# Patient Record
Sex: Female | Born: 1960 | Race: Black or African American | Hispanic: No | Smoking: Never smoker
Health system: Southern US, Community
[De-identification: ages and names within clinical notes are randomized; demographics above are authoritative.]

## PROBLEM LIST (undated history)

## (undated) DIAGNOSIS — G4730 Sleep apnea, unspecified: Secondary | ICD-10-CM

## (undated) DIAGNOSIS — C541 Malignant neoplasm of endometrium: Secondary | ICD-10-CM

## (undated) DIAGNOSIS — U071 COVID-19: Secondary | ICD-10-CM

## (undated) DIAGNOSIS — I10 Essential (primary) hypertension: Secondary | ICD-10-CM

## (undated) HISTORY — DX: Malignant neoplasm of endometrium: C54.1

## (undated) HISTORY — DX: COVID-19: U07.1

## (undated) HISTORY — PX: DILATION AND CURETTAGE, DIAGNOSTIC / THERAPEUTIC: SUR384

## (undated) HISTORY — PX: FOOT SURGERY: SHX648

## (undated) HISTORY — DX: Essential (primary) hypertension: I10

## (undated) HISTORY — PX: EXPLORATORY LAPAROTOMY: SUR591

## (undated) HISTORY — DX: Morbid (severe) obesity due to excess calories: E66.01

---

## 2007-01-07 ENCOUNTER — Emergency Department: Payer: Self-pay | Admitting: Emergency Medicine

## 2008-07-26 ENCOUNTER — Emergency Department: Payer: Self-pay | Admitting: Emergency Medicine

## 2008-08-08 ENCOUNTER — Emergency Department: Payer: Self-pay | Admitting: Emergency Medicine

## 2009-05-27 ENCOUNTER — Emergency Department: Payer: Self-pay | Admitting: Emergency Medicine

## 2013-12-04 ENCOUNTER — Emergency Department: Payer: Self-pay | Admitting: Emergency Medicine

## 2013-12-04 LAB — BASIC METABOLIC PANEL
Anion Gap: 5 — ABNORMAL LOW (ref 7–16)
BUN: 19 mg/dL — ABNORMAL HIGH (ref 7–18)
Calcium, Total: 8.6 mg/dL (ref 8.5–10.1)
Chloride: 107 mmol/L (ref 98–107)
Co2: 27 mmol/L (ref 21–32)
Creatinine: 0.95 mg/dL (ref 0.60–1.30)
EGFR (African American): >60
GLUCOSE: 92 mg/dL (ref 65–99)
Osmolality: 279 (ref 275–301)
Potassium: 3.6 mmol/L (ref 3.5–5.1)
Sodium: 139 mmol/L (ref 136–145)

## 2013-12-04 LAB — CBC
HCT: 37.3 % (ref 35.0–47.0)
HGB: 12.1 g/dL (ref 12.0–16.0)
MCH: 27.6 pg (ref 26.0–34.0)
MCHC: 32.4 g/dL (ref 32.0–36.0)
MCV: 85 fL (ref 80–100)
Platelet: 222 10*3/uL (ref 150–440)
RBC: 4.37 10*6/uL (ref 3.80–5.20)
RDW: 14 % (ref 11.5–14.5)
WBC: 8.3 10*3/uL (ref 3.6–11.0)

## 2013-12-04 LAB — TROPONIN I

## 2013-12-05 LAB — TROPONIN I: Troponin-I: <0.02 ng/mL

## 2013-12-05 LAB — D-DIMER(ARMC): D-Dimer: 505 ng/ml

## 2014-06-18 ENCOUNTER — Ambulatory Visit: Payer: Self-pay

## 2019-07-28 ENCOUNTER — Emergency Department
Admission: EM | Admit: 2019-07-28 | Discharge: 2019-07-28 | Disposition: A | Discharge status: Home / Self Care | Payer: Medicaid Other | Attending: Emergency Medicine | Admitting: Emergency Medicine

## 2019-07-28 ENCOUNTER — Other Ambulatory Visit: Payer: Self-pay

## 2019-07-28 ENCOUNTER — Emergency Department: Payer: Medicaid Other

## 2019-07-28 ENCOUNTER — Encounter: Payer: Self-pay | Admitting: Emergency Medicine

## 2019-07-28 DIAGNOSIS — R0789 Other chest pain: Secondary | ICD-10-CM

## 2019-07-28 DIAGNOSIS — R109 Unspecified abdominal pain: Secondary | ICD-10-CM | POA: Insufficient documentation

## 2019-07-28 DIAGNOSIS — M7918 Myalgia, other site: Secondary | ICD-10-CM

## 2019-07-28 LAB — CBC
HCT: 41.4 % (ref 36.0–46.0)
Hemoglobin: 14 g/dL (ref 12.0–15.0)
MCH: 28.5 pg (ref 26.0–34.0)
MCHC: 33.8 g/dL (ref 30.0–36.0)
MCV: 84.1 fL (ref 80.0–100.0)
Platelets: 260 10*3/uL (ref 150–400)
RBC: 4.92 MIL/uL (ref 3.87–5.11)
RDW: 13.5 % (ref 11.5–15.5)
WBC: 6.7 10*3/uL (ref 4.0–10.5)
nRBC: 0 % (ref 0.0–0.2)

## 2019-07-28 LAB — BASIC METABOLIC PANEL
Anion gap: 10 (ref 5–15)
BUN: 16 mg/dL (ref 6–20)
CO2: 25 mmol/L (ref 22–32)
Calcium: 8.8 mg/dL — ABNORMAL LOW (ref 8.9–10.3)
Chloride: 105 mmol/L (ref 98–111)
Creatinine, Ser: 0.79 mg/dL (ref 0.44–1.00)
GFR calc Af Amer: >60 mL/min (ref >60)
GFR calc non Af Amer: >60 mL/min (ref >60)
Glucose, Bld: 103 mg/dL — ABNORMAL HIGH (ref 70–99)
Potassium: 4 mmol/L (ref 3.5–5.1)
Sodium: 140 mmol/L (ref 135–145)

## 2019-07-28 LAB — TROPONIN I (HIGH SENSITIVITY): Troponin I (High Sensitivity): 4 ng/L (ref <18)

## 2019-07-28 MED ORDER — KETOROLAC TROMETHAMINE 30 MG/ML IJ SOLN
30.0000 mg | Freq: Once | INTRAMUSCULAR | Status: AC
  Administered 2019-07-28: 30 mg via INTRAMUSCULAR
  Filled 2019-07-28: qty 1

## 2019-07-28 MED ORDER — NAPROXEN 500 MG PO TABS: 500.0000 mg | ORAL_TABLET | Freq: Two times a day (BID) | ORAL | 2 refills | Status: DC

## 2019-07-28 NOTE — ED Notes (Signed)
Placed BP cuff on pt's rt forearm.

## 2019-07-28 NOTE — ED Notes (Signed)
Patient transported to CT 

## 2019-07-28 NOTE — ED Triage Notes (Signed)
Pt reports chest pain on right side that started 3 days ago. Reports intermittent shortness of breath. Denies N/V, dizziness, or sweating with chest pain. Denies hx of heart problems. Reports that chest pain has occurred before.

## 2019-07-28 NOTE — ED Provider Notes (Signed)
[facility] Emergency Department Provider Note   ____________________________________________    I have reviewed the triage vital signs and the nursing notes.   HISTORY  Chief Complaint Right side pain    HPI Allison Dixon is a 58 y.o. female who presents with primary complaint of right side pain which she reports is been ongoing for 3 days, she is also noted some mild left chest discomfort as well which she describes as a sharp pain.  Denies injury to the area.  No recent exertion.  No shortness of breath.  No cough.  No nausea or vomiting.  No history of kidney stones or dysuria.  Has not take anything for this.  No recent travel, no sick contacts noted.  History reviewed. No pertinent past medical history.  There are no active problems to display for this patient.   History reviewed. No pertinent surgical history.  Prior to Admission medications   Medication Sig Start Date End Date Taking? Authorizing Provider  ibuprofen (ADVIL) 200 MG tablet Take 400 mg by mouth every 6 (six) hours as needed for pain.    [provider]  naproxen (NAPROSYN) 500 MG tablet Take 1 tablet (500 mg total) by mouth 2 (two) times daily with a meal. 07/28/19   Lavonia Drafts, MD     Allergies Patient has no known allergies.  History reviewed. No pertinent family history.  Social History Social History   Tobacco Use  . Smoking status: Never Smoker  Substance Use Topics  . Alcohol use: Never    Frequency: Never  . Drug use: Never    Review of Systems  Constitutional: No fever/chills Eyes: No visual changes.  ENT: No sore throat. Cardiovascular: As above Respiratory: Denies shortness of breath. Gastrointestinal: As above Genitourinary: Negative for dysuria. Musculoskeletal: Negative for back pain. Skin: Negative for rash. Neurological: Negative for headaches   ____________________________________________   PHYSICAL EXAM:  VITAL SIGNS: ED  Triage Vitals  Enc Vitals Group     BP 07/28/19 0826 (!) 147/98     Pulse Rate 07/28/19 0826 80     Resp 07/28/19 0826 20     Temp 07/28/19 0826 98.3 F (36.8 C)     Temp Source 07/28/19 0826 Oral     SpO2 07/28/19 0826 96 %     Weight 07/28/19 0815 108.9 kg (240 lb)     Height 07/28/19 0815 1.549 m (5\' 1" )     Head Circumference --      Peak Flow --      Pain Score 07/28/19 0814 9     Pain Loc --      Pain Edu? --      Excl. in [place]? --     Constitutional: Alert and oriented.   Nose: No congestion/rhinnorhea. Mouth/Throat: Mucous membranes are moist.    Cardiovascular: Normal rate, regular rhythm. Grossly normal heart sounds.  Good peripheral circulation. Respiratory: Normal respiratory effort.  No retractions. Lungs CTAB. Gastrointestinal: Soft and nontender. No distention.    Musculoskeletal: Mild tenderness to the right abdominal wall in the area of the obliques, warm and well perfused Neurologic:  Normal speech and language. No gross focal neurologic deficits are appreciated.  Skin:  Skin is warm, dry and intact. No rash noted. Psychiatric: Mood and affect are normal. Speech and behavior are normal.  ____________________________________________   LABS (all labs ordered are listed, but only abnormal results are displayed)  Labs Reviewed  BASIC METABOLIC PANEL - Abnormal; Notable for  the following components:      Result Value   Glucose, Bld 103 (*)    Calcium 8.8 (*)    All other components within normal limits  CBC  POC URINE PREG, ED  TROPONIN I (HIGH SENSITIVITY)   ____________________________________________  EKG  ED ECG REPORT I, Lavonia Drafts, the attending physician, personally viewed and interpreted this ECG.  Date: 07/28/2019  Rhythm: normal sinus rhythm QRS Axis: normal Intervals: normal ST/T Wave abnormalities: normal Narrative Interpretation: no evidence of acute ischemia  ____________________________________________  RADIOLOGY  CT  renal stone study unremarkable, chest x-ray unremarkable ____________________________________________   PROCEDURES  Procedure(s) performed: No  Procedures   Critical Care performed: No ____________________________________________   INITIAL IMPRESSION / ASSESSMENT AND PLAN / ED COURSE  Pertinent labs & imaging results that were available during my care of the patient were reviewed by me and considered in my medical decision making (see chart for details).  Patient's exam is suspicious for musculoskeletal pain given pain along the obliques, differential also includes  ureterolithiasis.  Lab work is overall reassuring, will send for CT renal stone study, treat with IM Toradol and reevaluate.  Patient denies dysuria, no frequency  Patient notes her pain is resolved after Toradol, CT scan is unremarkable.  Most consistent with musculoskeletal pain, will treat with Naprosyn, close outpatient follow-up with PCP, return precautions discussed ____________________________________________   FINAL CLINICAL IMPRESSION(S) / ED DIAGNOSES  Final diagnoses:  Atypical chest pain  Musculoskeletal pain        Note:  This document was prepared using Dragon voice recognition software and may include unintentional dictation errors.   Lavonia Drafts, MD 07/28/19 [phone]

## 2019-07-28 NOTE — ED Notes (Signed)
Daughter is at bedside w/ pt

## 2019-07-28 NOTE — ED Notes (Signed)
In [place] take pt to 8 when done.

## 2019-08-11 ENCOUNTER — Ambulatory Visit: Payer: Self-pay | Admitting: Cardiology

## 2019-08-17 ENCOUNTER — Ambulatory Visit: Payer: Self-pay | Admitting: Cardiology

## 2019-08-21 ENCOUNTER — Ambulatory Visit: Payer: Self-pay | Admitting: Cardiology

## 2019-09-05 ENCOUNTER — Encounter: Payer: Self-pay | Admitting: Cardiology

## 2019-09-05 ENCOUNTER — Other Ambulatory Visit: Payer: Self-pay

## 2019-09-05 ENCOUNTER — Ambulatory Visit (INDEPENDENT_AMBULATORY_CARE_PROVIDER_SITE_OTHER): Payer: Medicaid Other | Admitting: Cardiology

## 2019-09-05 VITALS — BP 150/98 | HR 78 | Ht 66.0 in | Wt 244.0 lb

## 2019-09-05 DIAGNOSIS — Z6839 Body mass index (BMI) 39.0-39.9, adult: Secondary | ICD-10-CM | POA: Diagnosis not present

## 2019-09-05 DIAGNOSIS — R0789 Other chest pain: Secondary | ICD-10-CM

## 2019-09-05 DIAGNOSIS — I10 Essential (primary) hypertension: Secondary | ICD-10-CM

## 2019-09-05 MED ORDER — LOSARTAN POTASSIUM 50 MG PO TABS: 50.0000 mg | ORAL_TABLET | Freq: Every day | ORAL | 3 refills | Status: DC

## 2019-09-05 MED ORDER — OMEPRAZOLE 40 MG PO CPDR: 40.0000 mg | DELAYED_RELEASE_CAPSULE | Freq: Every day | ORAL | 2 refills | Status: DC

## 2019-09-05 NOTE — Patient Instructions (Signed)
Medication Instructions:  Your physician has recommended you make the following change in your medication:  1- START Losartan 50 mg (1 tablet) by mouth once a day. 2- START Omeprazole 40 mg by mouth once a day.  (You may be able to get this medication over-the-counter as a less expensive cost. Discuss with the  Pharmacist.)  *If you need a refill on your cardiac medications before your next appointment, please call your pharmacy*  Lab Work: none If you have labs (blood work) drawn today and your tests are completely normal, you will receive your results only by: Marland Kitchen MyChart Message (if you have MyChart) OR . A paper copy in the mail If you have any lab test that is abnormal or we need to change your treatment, we will call you to review the results.  Testing/Procedures: none  Follow-Up: At [facility], you and your health needs are our priority.  As part of our continuing mission to provide you with exceptional heart care, we have created designated Provider Care Teams.  These Care Teams include your primary Cardiologist (physician) and Advanced Practice Providers (APPs -  Physician Assistants and Nurse Practitioners) who all work together to provide you with the care you need, when you need it.  Your next appointment:   1 month(s)  The format for your next appointment:   In Person  Provider:    You may see Dr. Garen Lah or one of the following Advanced Practice Providers on your designated Care Team:    Murray Hodgkins, NP  Christell Faith, PA-C  Marrianne Mood, PA-C

## 2019-09-05 NOTE — Progress Notes (Signed)
Cardiology Office Note:    Date:  09/05/2019   ID:  Allison Dixon, DOB 12/23/1960, MRN [id number]  PCP:  Patient, No Pcp Per  Cardiologist:  No primary care provider on file.  Electrophysiologist:  None   Referring MD: Lavonia Drafts, MD   Chief Complaint  Patient presents with  . other    F/u [facility] CP Pt no compliants. Meds reviewed verbally with pt.     History of Present Illness:    Allison Dixon is a 58 y.o. female with a hx of hypertension who presents due to chest pain.  Symptoms have been ongoing for about a year now.  Symptoms typically occur when patient is lying down in bed.  She describes symptoms as a burning sensation in her chest when she lays on her back, rates discomfort as a 7 out of 10 when it occurs.  She usually sleeps on the side to make symptoms better.  She has no symptoms with exertion but states feeling out of breath when walking from the parking lot to the office today.  Patient was seen about a month ago in the emergency room for chest pain and also back pain. In the ED, high-sensitivity troponin was normal, EKG did not show any ST changes to suggest ischemia or infarct.  Due to lumbar pain, CT was performed to evaluate possible kidney stones.  No ureterolithiasis was observed, but degenerative disease and lumbar spine spondylosis noted.  Pain improved with IM Toradol.    She denies any prior history of heart disease, she has not seen a physician in a while, has no PCP due to lack of insurance.  She is trying to get insurance.  She has been told before that her blood pressures were elevated.  Her BP in the emergency room was [id number] systolic.  Past Medical History:  Diagnosis Date  . Hypertension     History reviewed. No pertinent surgical history.  Current Medications: Current Meds  Medication Sig  . ibuprofen (ADVIL) 200 MG tablet Take 400 mg by mouth every 6 (six) hours as needed for pain.  . naproxen (NAPROSYN) 500 MG tablet Take 1 tablet (500 mg total) by  mouth 2 (two) times daily with a meal.     Allergies:   Patient has no known allergies.   Social History   Socioeconomic History  . Marital status: Married    Spouse name: Not on file  . Number of children: Not on file  . Years of education: Not on file  . Highest education level: Not on file  Occupational History  . Not on file  Tobacco Use  . Smoking status: Never Smoker  . Smokeless tobacco: Never Used  Substance and Sexual Activity  . Alcohol use: Never  . Drug use: Never  . Sexual activity: Yes  Other Topics Concern  . Not on file  Social History Narrative  . Not on file   Social Determinants of Health   Financial Resource Strain:   . Difficulty of Paying Living Expenses: Not on file  Food Insecurity:   . Worried About Charity fundraiser in the Last Year: Not on file  . Ran Out of Food in the Last Year: Not on file  Transportation Needs:   . Lack of Transportation (Medical): Not on file  . Lack of Transportation (Non-Medical): Not on file  Physical Activity:   . Days of Exercise per Week: Not on file  . Minutes of Exercise per Session: Not on  file  Stress:   . Feeling of Stress : Not on file  Social Connections:   . Frequency of Communication with Friends and Family: Not on file  . Frequency of Social Gatherings with Friends and Family: Not on file  . Attends Religious Services: Not on file  . Active Member of Clubs or Organizations: Not on file  . Attends Archivist Meetings: Not on file  . Marital Status: Not on file     Family History: The patient's family history includes Heart attack in her mother.  ROS:   Please see the history of present illness.     All other systems reviewed and are negative.  EKGs/Labs/Other Studies Reviewed:    The following studies were reviewed today:   EKG:  EKG is  ordered today.  The ekg ordered today demonstrates normal sinus rhythm, occasional PVC.  Recent Labs: 07/28/2019: BUN 16; Creatinine, Ser  0.79; Hemoglobin 14.0; Platelets 260; Potassium 4.0; Sodium 140  Recent Lipid Panel No results found for: CHOL, TRIG, HDL, CHOLHDL, VLDL, LDLCALC, LDLDIRECT  Physical Exam:    VS:  BP (!) 150/98 (BP Location: Right Arm, Patient Position: Sitting, Cuff Size: Large)   Pulse 78   Ht 5\' 6"  (1.676 m)   Wt 244 lb (110.7 kg)   SpO2 97%   BMI 39.38 kg/m     Wt Readings from Last 3 Encounters:  09/05/19 244 lb (110.7 kg)  07/28/19 240 lb (108.9 kg)     GEN:  Well nourished, well developed in no acute distress HEENT: Normal NECK: No JVD; No carotid bruits LYMPHATICS: No lymphadenopathy CARDIAC: RRR, no murmurs, rubs, gallops RESPIRATORY:  Clear to auscultation without rales, wheezing or rhonchi  ABDOMEN: Soft, non-tender, non-distended MUSCULOSKELETAL:  No edema; No deformity  SKIN: Warm and dry NEUROLOGIC:  Alert and oriented x 3 PSYCHIATRIC:  Normal affect   ASSESSMENT:   Her symptoms appear atypical.  Symptoms likely GI related due to long chest burning when she lays on her back and improves with laying on her side.  Blood pressure is elevated.  Was elevated in the emergency room.  Shortness of breath with exertion likely deconditioning due to being obese. 1. Atypical chest pain   2. Hypertension, unspecified type   3. BMI 39.0-39.9,adult    PLAN:    In order of problems listed above:  1. Start omeprazole 40 mg daily.  May consider further testing if symptoms persist. 2. Start losartan 50 mg daily.  Patient encouraged to check blood pressures daily at home. 3. Weight loss advised.  Follow-up in 1 month  This note was generated in part or whole with voice recognition software. Voice recognition is usually quite accurate but there are transcription errors that can and very often do occur. I apologize for any typographical errors that were not detected and corrected.  Medication Adjustments/Labs and Tests Ordered: Current medicines are reviewed at length with the patient  today.  Concerns regarding medicines are outlined above.  Orders Placed This Encounter  Procedures  . EKG 12-Lead   Meds ordered this encounter  Medications  . losartan (COZAAR) 50 MG tablet    Sig: Take 1 tablet (50 mg total) by mouth daily.    Dispense:  90 tablet    Refill:  3  . omeprazole (PRILOSEC) 40 MG capsule    Sig: Take 1 capsule (40 mg total) by mouth daily.    Dispense:  90 capsule    Refill:  2    Patient  Instructions  Medication Instructions:  Your physician has recommended you make the following change in your medication:  1- START Losartan 50 mg (1 tablet) by mouth once a day. 2- START Omeprazole 40 mg by mouth once a day.  (You may be able to get this medication over-the-counter as a less expensive cost. Discuss with the  Pharmacist.)  *If you need a refill on your cardiac medications before your next appointment, please call your pharmacy*  Lab Work: none If you have labs (blood work) drawn today and your tests are completely normal, you will receive your results only by: Marland Kitchen MyChart Message (if you have MyChart) OR . A paper copy in the mail If you have any lab test that is abnormal or we need to change your treatment, we will call you to review the results.  Testing/Procedures: none  Follow-Up: At [facility], you and your health needs are our priority.  As part of our continuing mission to provide you with exceptional heart care, we have created designated Provider Care Teams.  These Care Teams include your primary Cardiologist (physician) and Advanced Practice Providers (APPs -  Physician Assistants and Nurse Practitioners) who all work together to provide you with the care you need, when you need it.  Your next appointment:   1 month(s)  The format for your next appointment:   In Person  Provider:    You may see Dr. Garen Lah or one of the following Advanced Practice Providers on your designated Care Team:    Murray Hodgkins, NP  Christell Faith, PA-C  Marrianne Mood, PA-C     Signed, Kate Sable, MD  09/05/2019 9:29 AM    [place]

## 2019-09-08 DIAGNOSIS — C801 Malignant (primary) neoplasm, unspecified: Secondary | ICD-10-CM

## 2019-09-08 HISTORY — DX: Malignant (primary) neoplasm, unspecified: C80.1

## 2019-09-12 ENCOUNTER — Telehealth: Payer: Self-pay | Admitting: Cardiology

## 2019-09-12 NOTE — Telephone Encounter (Signed)
Call received from patient and daughter. Patient gave verbal permission to speak with daughter, Mateo Flow. Mateo Flow states patient BP today was 170/92. Reports patient says it is "too strong." Inquired on what "too strong" meant and it sounded like patient thought the 50 mg was too high. Explained that the Mg on medications may be different and does not compare to each other. Patient says she's had a headache since last night. Patient reports taking the losartan 50 mg every night before bed for the past week. They could not provide any other readings at this time but daughter thinks BP has remained elevated. Denies chest pain or shortness of breath or blurred vision. Reports occasional dizziness.  Advised patient to continue losartan and monitor BP and HR every morning. Advised ok to take tylenol and to call us back if headache does not go away or BP remains elevated. Routing to Dr Garen Lah for further review and advice.

## 2019-09-12 NOTE — Telephone Encounter (Signed)
Pt c/o BP issue: STAT if pt c/o blurred vision, one-sided weakness or slurred speech  1. What are your last 5 BP readings? 170/92  2. Are you having any other symptoms (ex. Dizziness, headache, blurred vision, passed out)? headache  3. What is your BP issue? Patient daughter is calling.  States patient's BP is high and is getting a headache, wants to take tylenol but daughter wants to check with doctor before taking.  Please advise.

## 2019-09-18 MED ORDER — AMLODIPINE BESYLATE 5 MG PO TABS: 5.0000 mg | ORAL_TABLET | Freq: Every day | ORAL | 5 refills | Status: DC

## 2019-09-18 NOTE — Telephone Encounter (Signed)
No answer with daughter's number. Left message to call back.  Called patient and she answered. She verbalized understanding to stop the losartan and start amlodipine 5 mg by mouth once a day. Med list updated and Rx sent to pharmacy.

## 2019-09-18 NOTE — Telephone Encounter (Signed)
Kate Sable, MD  You 54 minutes ago (8:32 AM)   We can stop the losartan. Start amlodipine 5 mg daily. Keep follow-up appointment.   Routing comment

## 2019-09-22 ENCOUNTER — Emergency Department
Admission: EM | Admit: 2019-09-22 | Discharge: 2019-09-22 | Disposition: A | Discharge status: Home / Self Care | Payer: Medicaid Other | Attending: Emergency Medicine | Admitting: Emergency Medicine

## 2019-09-22 ENCOUNTER — Encounter: Payer: Self-pay | Admitting: Emergency Medicine

## 2019-09-22 ENCOUNTER — Other Ambulatory Visit: Payer: Self-pay

## 2019-09-22 DIAGNOSIS — Z79899 Other long term (current) drug therapy: Secondary | ICD-10-CM | POA: Insufficient documentation

## 2019-09-22 DIAGNOSIS — Z20822 Contact with and (suspected) exposure to covid-19: Secondary | ICD-10-CM | POA: Diagnosis not present

## 2019-09-22 DIAGNOSIS — I10 Essential (primary) hypertension: Secondary | ICD-10-CM | POA: Insufficient documentation

## 2019-09-22 DIAGNOSIS — R531 Weakness: Secondary | ICD-10-CM | POA: Insufficient documentation

## 2019-09-22 LAB — CBC
HCT: 42.7 % (ref 36.0–46.0)
Hemoglobin: 13.7 g/dL (ref 12.0–15.0)
MCH: 28.1 pg (ref 26.0–34.0)
MCHC: 32.1 g/dL (ref 30.0–36.0)
MCV: 87.7 fL (ref 80.0–100.0)
Platelets: 178 10*3/uL (ref 150–400)
RBC: 4.87 MIL/uL (ref 3.87–5.11)
RDW: 13.2 % (ref 11.5–15.5)
WBC: 5.8 10*3/uL (ref 4.0–10.5)
nRBC: 0 % (ref 0.0–0.2)

## 2019-09-22 LAB — URINALYSIS, COMPLETE (UACMP) WITH MICROSCOPIC
Bacteria, UA: NONE SEEN
Bilirubin Urine: NEGATIVE
Glucose, UA: NEGATIVE mg/dL
Ketones, ur: 5 mg/dL — AB
Leukocytes,Ua: NEGATIVE
Nitrite: NEGATIVE
Protein, ur: 30 mg/dL — AB
Specific Gravity, Urine: 1.028 (ref 1.005–1.030)
pH: 5 (ref 5.0–8.0)

## 2019-09-22 LAB — BASIC METABOLIC PANEL
Anion gap: 10 (ref 5–15)
BUN: 14 mg/dL (ref 6–20)
CO2: 25 mmol/L (ref 22–32)
Calcium: 8.4 mg/dL — ABNORMAL LOW (ref 8.9–10.3)
Chloride: 104 mmol/L (ref 98–111)
Creatinine, Ser: 0.98 mg/dL (ref 0.44–1.00)
GFR calc Af Amer: >60 mL/min (ref >60)
GFR calc non Af Amer: >60 mL/min (ref >60)
Glucose, Bld: 117 mg/dL — ABNORMAL HIGH (ref 70–99)
Potassium: 3.7 mmol/L (ref 3.5–5.1)
Sodium: 139 mmol/L (ref 135–145)

## 2019-09-22 MED ORDER — ONDANSETRON HCL 4 MG PO TABS: 4.0000 mg | ORAL_TABLET | Freq: Three times a day (TID) | ORAL | 0 refills | Status: DC | PRN

## 2019-09-22 MED ORDER — SODIUM CHLORIDE 0.9% FLUSH: 3.0000 mL | Freq: Once | INTRAVENOUS | Status: DC

## 2019-09-22 MED ORDER — SODIUM CHLORIDE 0.9 % IV BOLUS
1000.0000 mL | Freq: Once | INTRAVENOUS | Status: AC
  Administered 2019-09-22: 1000 mL via INTRAVENOUS

## 2019-09-22 NOTE — Discharge Instructions (Addendum)
Please seek medical attention for any high fevers, chest pain, shortness of breath, change in behavior, persistent vomiting, bloody stool or any other new or concerning symptoms.  

## 2019-09-22 NOTE — ED Triage Notes (Signed)
Dizzy/weakness/ sore throat/ difficulty swallowing. Poor appetite  x5 days

## 2019-09-22 NOTE — ED Notes (Signed)
See triage note. Pt states weakness since Sunday with diarrhea and not being able to eat for a few days.

## 2019-09-22 NOTE — ED Provider Notes (Signed)
[facility] Emergency Department Provider Note   ____________________________________________   I have reviewed the triage vital signs and the nursing notes.   HISTORY  Chief Complaint Weakness   History limited by: Not Limited   HPI Allison Dixon is a 59 y.o. female who presents to the emergency department today because of concern for weakness and feeling off for the past 5 days. The patient states that 5 days ago she was exposed to someone at church who tested positive for COVID. Since then she has felt weak and dizzy. Has had decreased PO intake. States her sense of taste and smell are both decreased. She has had occasional cough. Denies any fevers. Did get tested for COVID 3 days ago and was told that her result was negative. The patient denies any chronic lung disease.   Records reviewed. Per medical record review patient has a history of HTN.   Past Medical History:  Diagnosis Date  . Hypertension     There are no problems to display for this patient.   History reviewed. No pertinent surgical history.  Prior to Admission medications   Medication Sig Start Date End Date Taking? Authorizing Provider  amLODipine (NORVASC) 5 MG tablet Take 1 tablet (5 mg total) by mouth daily. 09/18/19 12/17/19  Kate Sable, MD  ibuprofen (ADVIL) 200 MG tablet Take 400 mg by mouth every 6 (six) hours as needed for pain.    [provider]  naproxen (NAPROSYN) 500 MG tablet Take 1 tablet (500 mg total) by mouth 2 (two) times daily with a meal. 07/28/19   Lavonia Drafts, MD  omeprazole (PRILOSEC) 40 MG capsule Take 1 capsule (40 mg total) by mouth daily. 09/05/19   Kate Sable, MD    Allergies Patient has no known allergies.  Family History  Problem Relation Age of Onset  . Heart attack Mother     Social History Social History   Tobacco Use  . Smoking status: Never Smoker  . Smokeless tobacco: Never Used  Substance Use Topics  . Alcohol  use: Never  . Drug use: Never    Review of Systems Constitutional: No fever/chills Eyes: No visual changes. ENT: Positive for change in sense of taste and smell.  Cardiovascular: Denies chest pain. Respiratory: Denies shortness of breath. Gastrointestinal: No abdominal pain.  Positive for decreased oral intake.  Genitourinary: Negative for dysuria. Musculoskeletal: Negative for back pain. Skin: Negative for rash. Neurological: Negative for headaches, focal weakness or numbness.  ____________________________________________   PHYSICAL EXAM:  VITAL SIGNS: ED Triage Vitals  Enc Vitals Group     BP 09/22/19 1125 112/76     Pulse Rate 09/22/19 1125      Resp 09/22/19 1125 18     Temp 09/22/19 1125 99.4 F (37.4 C)     Temp Source 09/22/19 1125 Oral     SpO2 --      Weight 09/22/19 1126 244 lb (110.7 kg)     Height 09/22/19 1126 5\' 4"  (1.626 m)     Head Circumference --      Peak Flow --      Pain Score 09/22/19 1126 8   Constitutional: Alert and oriented.  Eyes: Conjunctivae are normal.  ENT      Head: Normocephalic and atraumatic.      Nose: No congestion/rhinnorhea.      Mouth/Throat: Mucous membranes are moist.      Neck: No stridor. Hematological/Lymphatic/Immunilogical: No cervical lymphadenopathy. Cardiovascular: Normal rate, regular rhythm.  No murmurs,  rubs, or gallops.  Respiratory: Normal respiratory effort without tachypnea nor retractions. Breath sounds are clear and equal bilaterally. No wheezes/rales/rhonchi. Gastrointestinal: Soft and non tender. No rebound. No guarding.  Genitourinary: Deferred Musculoskeletal: Normal range of motion in all extremities. No lower extremity edema. Neurologic:  Normal speech and language. No gross focal neurologic deficits are appreciated.  Skin:  Skin is warm, dry and intact. No rash noted. Psychiatric: Mood and affect are normal. Speech and behavior are normal. Patient exhibits appropriate insight and  judgment.  ____________________________________________    LABS (pertinent positives/negatives)  CBC wbc 5.8, hgb 13.7, plt 178 BMP wnl except glu 117, ca 8.4 UA hazy, small hgb dipstick, 5 ketones, 30 protein, 0-5 rbc and wbc ____________________________________________   EKG  I, Nance Pear, attending physician, personally viewed and interpreted this EKG  EKG Time: 1135 Rate: 99 Rhythm: normal sinus rhythm Axis: rightward axis Intervals: qtc 426 QRS: narrow ST changes: no st elevation Impression: abnormal ekg ____________________________________________    RADIOLOGY  None  ____________________________________________   PROCEDURES  Procedures  ____________________________________________   INITIAL IMPRESSION / ASSESSMENT AND PLAN / ED COURSE  Pertinent labs & imaging results that were available during my care of the patient were reviewed by me and considered in my medical decision making (see chart for details).   Patient presented to the emergency department today because of concern for weakness and not feeling well. Did have COVID exposure. At this time I would have concern for covid given change in sensation of taste and smell. Patient did feel better after IVFs. Discussed covid with the patient. Will send off test. Will plan on discharging with zofran. Encouraged increased oral intake. Discussed return precautions.   ____________________________________________   FINAL CLINICAL IMPRESSION(S) / ED DIAGNOSES  Final diagnoses:  Weakness  Suspected COVID-19 virus infection     Note: This dictation was prepared with Dragon dictation. Any transcriptional errors that result from this process are unintentional     Nance Pear, MD 09/22/19 1521

## 2019-09-23 LAB — NOVEL CORONAVIRUS, NAA (HOSP ORDER, SEND-OUT TO REF LAB; TAT 18-24 HRS): SARS-CoV-2, NAA: NOT DETECTED

## 2019-10-06 ENCOUNTER — Other Ambulatory Visit: Payer: Self-pay

## 2019-10-06 ENCOUNTER — Ambulatory Visit (INDEPENDENT_AMBULATORY_CARE_PROVIDER_SITE_OTHER): Payer: Medicaid Other | Admitting: Cardiology

## 2019-10-06 ENCOUNTER — Encounter: Payer: Self-pay | Admitting: Cardiology

## 2019-10-06 VITALS — BP 118/82 | HR 94 | Ht 64.0 in | Wt 235.5 lb

## 2019-10-06 DIAGNOSIS — I10 Essential (primary) hypertension: Secondary | ICD-10-CM | POA: Diagnosis not present

## 2019-10-06 DIAGNOSIS — R0602 Shortness of breath: Secondary | ICD-10-CM | POA: Diagnosis not present

## 2019-10-06 DIAGNOSIS — R0600 Dyspnea, unspecified: Secondary | ICD-10-CM | POA: Diagnosis not present

## 2019-10-06 DIAGNOSIS — R0609 Other forms of dyspnea: Secondary | ICD-10-CM

## 2019-10-06 NOTE — Progress Notes (Signed)
Cardiology Office Note:    Date:  10/06/2019   ID:  Allison Dixon, DOB 04/12/1961, MRN [id number]  PCP:  Patient, No Pcp Per  Cardiologist:  No primary care provider on file.  Electrophysiologist:  None   Referring MD: No ref. provider found   Chief Complaint  Patient presents with  . other    1 month follow up. Meds reviewed by the pt. verbally. Pt. c/o shortness of breath with little to no exertion.     History of Present Illness:    Allison Dixon is a 59 y.o. female with a hx of hypertension who presents for follow-up.  She was last seen due to chest pain.  Symptoms typically occur when patient lays on her back.  She was started on omeprazole and symptoms suggest GI etiology.  Her blood pressure was elevated and losartan was started for BP control.  Patient had complaints of losartan being too strong as she had headaches.  Losartan was switched to amlodipine 5 mg daily.  Blood pressure is now well controlled.  Her chest discomfort/symptoms have resolved.  Patient however complains of dyspnea on exertion over the past 1 to 2 weeks.  She denies chest pain with the symptoms.   Past Medical History:  Diagnosis Date  . Hypertension     History reviewed. No pertinent surgical history.  Current Medications: Current Meds  Medication Sig  . amLODipine (NORVASC) 5 MG tablet Take 1 tablet (5 mg total) by mouth daily.  Marland Kitchen ibuprofen (ADVIL) 200 MG tablet Take 400 mg by mouth every 6 (six) hours as needed for pain.  . naproxen (NAPROSYN) 500 MG tablet Take 1 tablet (500 mg total) by mouth 2 (two) times daily with a meal.  . omeprazole (PRILOSEC) 40 MG capsule Take 1 capsule (40 mg total) by mouth daily.  . ondansetron (ZOFRAN) 4 MG tablet Take 1 tablet (4 mg total) by mouth every 8 (eight) hours as needed.     Allergies:   Patient has no known allergies.   Social History   Socioeconomic History  . Marital status: Married    Spouse name: Not on file  . Number of children: Not on file   . Years of education: Not on file  . Highest education level: Not on file  Occupational History  . Not on file  Tobacco Use  . Smoking status: Never Smoker  . Smokeless tobacco: Never Used  Substance and Sexual Activity  . Alcohol use: Never  . Drug use: Never  . Sexual activity: Yes  Other Topics Concern  . Not on file  Social History Narrative  . Not on file   Social Determinants of Health   Financial Resource Strain:   . Difficulty of Paying Living Expenses: Not on file  Food Insecurity:   . Worried About Charity fundraiser in the Last Year: Not on file  . Ran Out of Food in the Last Year: Not on file  Transportation Needs:   . Lack of Transportation (Medical): Not on file  . Lack of Transportation (Non-Medical): Not on file  Physical Activity:   . Days of Exercise per Week: Not on file  . Minutes of Exercise per Session: Not on file  Stress:   . Feeling of Stress : Not on file  Social Connections:   . Frequency of Communication with Friends and Family: Not on file  . Frequency of Social Gatherings with Friends and Family: Not on file  . Attends Religious Services:  Not on file  . Active Member of Clubs or Organizations: Not on file  . Attends Archivist Meetings: Not on file  . Marital Status: Not on file     Family History: The patient's family history includes Heart attack in her mother.  ROS:   Please see the history of present illness.     All other systems reviewed and are negative.  EKGs/Labs/Other Studies Reviewed:    The following studies were reviewed today:   EKG:  EKG is  ordered today.  The ekg ordered today demonstrates normal sinus rhythm, occasional PVC.  Recent Labs: 09/22/2019: BUN 14; Creatinine, Ser 0.98; Hemoglobin 13.7; Platelets 178; Potassium 3.7; Sodium 139  Recent Lipid Panel No results found for: CHOL, TRIG, HDL, CHOLHDL, VLDL, LDLCALC, LDLDIRECT  Physical Exam:    VS:  BP 118/82 (BP Location: Left Arm, Patient  Position: Sitting, Cuff Size: Large)   Pulse 94   Ht 5\' 4"  (1.626 m)   Wt 235 lb 8 oz (106.8 kg)   LMP 09/22/2019   SpO2 98%   BMI 40.42 kg/m     Wt Readings from Last 3 Encounters:  10/06/19 235 lb 8 oz (106.8 kg)  09/22/19 244 lb (110.7 kg)  09/05/19 244 lb (110.7 kg)     GEN:  Well nourished, well developed in no acute distress, obese HEENT: Normal NECK: No JVD; No carotid bruits LYMPHATICS: No lymphadenopathy CARDIAC: RRR, no murmurs, rubs, gallops RESPIRATORY:  Clear to auscultation without rales, wheezing or rhonchi  ABDOMEN: Soft, non-tender, distended MUSCULOSKELETAL:  No edema; No deformity  SKIN: Warm and dry NEUROLOGIC:  Alert and oriented x 3 PSYCHIATRIC:  Normal affect   ASSESSMENT:    1. Dyspnea on exertion   2. Morbid obesity ([place])   3. Essential hypertension   4. Shortness of breath    PLAN:    In order of problems listed above:  1. Patient complains of dyspnea on exertion over the past 1 to 2 weeks.  Has risk factors of hypertension and obesity.  This could be an anginal equivalent or secondary to deconditioning/obesity.  Will get echocardiogram and Lexiscan myocardial perfusion imaging stress test to evaluate cardiac function and presence of CAD. 2. Weight loss advised. 3. Blood pressure not controlled.  Continue amlodipine 5 mg daily.   Follow-up after testing in about 2 months   This note was generated in part or whole with voice recognition software. Voice recognition is usually quite accurate but there are transcription errors that can and very often do occur. I apologize for any typographical errors that were not detected and corrected.  Medication Adjustments/Labs and Tests Ordered: Current medicines are reviewed at length with the patient today.  Concerns regarding medicines are outlined above.  Orders Placed This Encounter  Procedures  . NM Myocar Multi W/Spect W/Wall Motion / EF  . EKG 12-Lead  . ECHOCARDIOGRAM COMPLETE   No orders of  the defined types were placed in this encounter.   Patient Instructions  Medication Instructions:  Your physician recommends that you continue on your current medications as directed. Please refer to the Current Medication list given to you today.  *If you need a refill on your cardiac medications before your next appointment, please call your pharmacy*  Lab Work: None ordeerd If you have labs (blood work) drawn today and your tests are completely normal, you will receive your results only by: Marland Kitchen MyChart Message (if you have MyChart) OR . A paper copy in the mail  If you have any lab test that is abnormal or we need to change your treatment, we will call you to review the results.  Testing/Procedures: Your physician has requested that you have an echocardiogram. Echocardiography is a painless test that uses sound waves to create images of your heart. It provides your doctor with information about the size and shape of your heart and how well your heart's chambers and valves are working. This procedure takes approximately one hour. There are no restrictions for this procedure.  Your physician has requested that you have a lexiscan myoview. For further information please visit [email or web address]. Please follow instruction sheet, as given.    Follow-Up: At [facility], you and your health needs are our priority.  As part of our continuing mission to provide you with exceptional heart care, we have created designated Provider Care Teams.  These Care Teams include your primary Cardiologist (physician) and Advanced Practice Providers (APPs -  Physician Assistants and Nurse Practitioners) who all work together to provide you with the care you need, when you need it.  Your next appointment:   After testing    The format for your next appointment:   In Person  Provider:    You may see Dr. Mylo Red- Charlestine Night  or one of the following Advanced Practice Providers on your designated Care Team:     Murray Hodgkins, NP  Christell Faith, PA-C  Marrianne Mood, PA-C   Other Instructions [place]  Your caregiver has ordered a Stress Test with nuclear imaging. The purpose of this test is to evaluate the blood supply to your heart muscle. This procedure is referred to as a "Non-Invasive Stress Test." This is because other than having an IV started in your vein, nothing is inserted or "invades" your body. Cardiac stress tests are done to find areas of poor blood flow to the heart by determining the extent of coronary artery disease (CAD). Some patients exercise on a treadmill, which naturally increases the blood flow to your heart, while others who are  unable to walk on a treadmill due to physical limitations have a pharmacologic/chemical stress agent called Lexiscan . This medicine will mimic walking on a treadmill by temporarily increasing your coronary blood flow.   Please note: these test may take anywhere between 2-4 hours to complete  PLEASE REPORT TO [place] AT THE FIRST DESK WILL DIRECT YOU WHERE TO GO  Date of Procedure:_____________________________________  Arrival Time for Procedure:______________________________    PLEASE NOTIFY THE OFFICE AT LEAST 24 HOURS IN ADVANCE IF YOU ARE UNABLE TO KEEP YOUR APPOINTMENT.  [phone] AND  PLEASE NOTIFY NUCLEAR MEDICINE AT [facility] AT LEAST 24 HOURS IN ADVANCE IF YOU ARE UNABLE TO KEEP YOUR APPOINTMENT. [phone]  How to prepare for your Myoview test:  1. Do not eat or drink after midnight 2. No caffeine for 24 hours prior to test 3. No smoking 24 hours prior to test. 4. Your medication may be taken with water.  If your doctor stopped a medication because of this test, do not take that medication. 5. Ladies, please do not wear dresses.  Skirts or pants are appropriate. Please wear a short sleeve shirt. 6. No perfume, cologne or lotion. 7. Wear comfortable walking shoes. No  heels!               Signed, Kate Sable, MD  10/06/2019 10:55 AM    [place]

## 2019-10-06 NOTE — Patient Instructions (Signed)
Medication Instructions:  Your physician recommends that you continue on your current medications as directed. Please refer to the Current Medication list given to you today.  *If you need a refill on your cardiac medications before your next appointment, please call your pharmacy*  Lab Work: None ordeerd If you have labs (blood work) drawn today and your tests are completely normal, you will receive your results only by: Marland Kitchen MyChart Message (if you have MyChart) OR . A paper copy in the mail If you have any lab test that is abnormal or we need to change your treatment, we will call you to review the results.  Testing/Procedures: Your physician has requested that you have an echocardiogram. Echocardiography is a painless test that uses sound waves to create images of your heart. It provides your doctor with information about the size and shape of your heart and how well your heart's chambers and valves are working. This procedure takes approximately one hour. There are no restrictions for this procedure.  Your physician has requested that you have a lexiscan myoview. For further information please visit [email or web address]. Please follow instruction sheet, as given.    Follow-Up: At [facility], you and your health needs are our priority.  As part of our continuing mission to provide you with exceptional heart care, we have created designated Provider Care Teams.  These Care Teams include your primary Cardiologist (physician) and Advanced Practice Providers (APPs -  Physician Assistants and Nurse Practitioners) who all work together to provide you with the care you need, when you need it.  Your next appointment:   After testing    The format for your next appointment:   In Person  Provider:    You may see Dr. Mylo Red- Charlestine Night  or one of the following Advanced Practice Providers on your designated Care Team:    Murray Hodgkins, NP  Christell Faith, PA-C  Marrianne Mood, PA-C   Other  Instructions [place]  Your caregiver has ordered a Stress Test with nuclear imaging. The purpose of this test is to evaluate the blood supply to your heart muscle. This procedure is referred to as a "Non-Invasive Stress Test." This is because other than having an IV started in your vein, nothing is inserted or "invades" your body. Cardiac stress tests are done to find areas of poor blood flow to the heart by determining the extent of coronary artery disease (CAD). Some patients exercise on a treadmill, which naturally increases the blood flow to your heart, while others who are  unable to walk on a treadmill due to physical limitations have a pharmacologic/chemical stress agent called Lexiscan . This medicine will mimic walking on a treadmill by temporarily increasing your coronary blood flow.   Please note: these test may take anywhere between 2-4 hours to complete  PLEASE REPORT TO [place] AT THE FIRST DESK WILL DIRECT YOU WHERE TO GO  Date of Procedure:_____________________________________  Arrival Time for Procedure:______________________________    PLEASE NOTIFY THE OFFICE AT LEAST 24 HOURS IN ADVANCE IF YOU ARE UNABLE TO KEEP YOUR APPOINTMENT.  [phone] AND  PLEASE NOTIFY NUCLEAR MEDICINE AT [facility] AT LEAST 24 HOURS IN ADVANCE IF YOU ARE UNABLE TO KEEP YOUR APPOINTMENT. [phone]  How to prepare for your Myoview test:  1. Do not eat or drink after midnight 2. No caffeine for 24 hours prior to test 3. No smoking 24 hours prior to test. 4. Your medication may be taken with water.  If your doctor stopped a medication because of this test, do not take that medication. 5. Ladies, please do not wear dresses.  Skirts or pants are appropriate. Please wear a short sleeve shirt. 6. No perfume, cologne or lotion. 7. Wear comfortable walking shoes. No heels!

## 2019-10-13 ENCOUNTER — Encounter
Admission: RE | Admit: 2019-10-13 | Discharge: 2019-10-13 | Disposition: A | Discharge status: Home / Self Care | Payer: Medicaid Other | Source: Ambulatory Visit | Attending: Cardiology | Admitting: Cardiology

## 2019-10-13 ENCOUNTER — Other Ambulatory Visit: Payer: Self-pay

## 2019-10-13 DIAGNOSIS — R0600 Dyspnea, unspecified: Secondary | ICD-10-CM | POA: Diagnosis present

## 2019-10-13 DIAGNOSIS — R0602 Shortness of breath: Secondary | ICD-10-CM

## 2019-10-13 DIAGNOSIS — R0609 Other forms of dyspnea: Secondary | ICD-10-CM

## 2019-10-13 LAB — NM MYOCAR MULTI W/SPECT W/WALL MOTION / EF
LV dias vol: 112 mL (ref 46–106)
LV sys vol: 48 mL
Peak HR: 100 {beats}/min
Percent HR: 60 %
Rest HR: 65 {beats}/min
SDS: 3
SRS: 4
SSS: 1
TID: 1.08

## 2019-10-13 MED ORDER — REGADENOSON 0.4 MG/5ML IV SOLN
0.4000 mg | Freq: Once | INTRAVENOUS | Status: AC
  Administered 2019-10-13: 10:00:00 0.4 mg via INTRAVENOUS
  Filled 2019-10-13: qty 5

## 2019-10-13 MED ORDER — TECHNETIUM TC 99M TETROFOSMIN IV KIT
11.0800 | PACK | Freq: Once | INTRAVENOUS | Status: AC | PRN
  Administered 2019-10-13: 09:00:00 11.08 via INTRAVENOUS

## 2019-10-13 MED ORDER — TECHNETIUM TC 99M TETROFOSMIN IV KIT
32.1800 | PACK | Freq: Once | INTRAVENOUS | Status: AC | PRN
  Administered 2019-10-13: 10:00:00 32.18 via INTRAVENOUS

## 2019-10-27 ENCOUNTER — Other Ambulatory Visit: Payer: Self-pay

## 2019-10-31 ENCOUNTER — Ambulatory Visit: Payer: Self-pay | Admitting: Cardiology

## 2019-12-06 ENCOUNTER — Other Ambulatory Visit: Payer: Self-pay

## 2019-12-06 ENCOUNTER — Ambulatory Visit (INDEPENDENT_AMBULATORY_CARE_PROVIDER_SITE_OTHER): Payer: Medicaid Other

## 2019-12-06 DIAGNOSIS — R0609 Other forms of dyspnea: Secondary | ICD-10-CM

## 2019-12-06 DIAGNOSIS — R0600 Dyspnea, unspecified: Secondary | ICD-10-CM | POA: Diagnosis not present

## 2019-12-12 ENCOUNTER — Encounter: Payer: Self-pay | Admitting: Cardiology

## 2019-12-12 ENCOUNTER — Ambulatory Visit (INDEPENDENT_AMBULATORY_CARE_PROVIDER_SITE_OTHER): Payer: Medicaid Other | Admitting: Cardiology

## 2019-12-12 ENCOUNTER — Other Ambulatory Visit: Payer: Self-pay

## 2019-12-12 VITALS — BP 140/90 | HR 68 | Ht 62.0 in | Wt 240.0 lb

## 2019-12-12 DIAGNOSIS — I10 Essential (primary) hypertension: Secondary | ICD-10-CM

## 2019-12-12 DIAGNOSIS — R0600 Dyspnea, unspecified: Secondary | ICD-10-CM | POA: Diagnosis not present

## 2019-12-12 DIAGNOSIS — I739 Peripheral vascular disease, unspecified: Secondary | ICD-10-CM | POA: Diagnosis not present

## 2019-12-12 DIAGNOSIS — R0609 Other forms of dyspnea: Secondary | ICD-10-CM

## 2019-12-12 DIAGNOSIS — R400 Somnolence: Secondary | ICD-10-CM

## 2019-12-12 NOTE — Patient Instructions (Signed)
Medication Instructions:  Your physician recommends that you continue on your current medications as directed. Please refer to the Current Medication list given to you today.  *If you need a refill on your cardiac medications before your next appointment, please call your pharmacy*   Lab Work: none If you have labs (blood work) drawn today and your tests are completely normal, you will receive your results only by: Marland Kitchen MyChart Message (if you have MyChart) OR . A paper copy in the mail If you have any lab test that is abnormal or we need to change your treatment, we will call you to review the results.   Testing/Procedures: Your physician has requested that you have an ankle brachial index (ABI). During this test an ultrasound and blood pressure cuff are used to evaluate the arteries that supply the arms and legs with blood. Allow thirty minutes for this exam. There are no restrictions or special instructions.  Your physician has requested that you have a lower extremity arterial doppler- During this test, ultrasound is used to evaluate arterial blood flow in the legs. Allow approximately one hour for this exam.    Follow-Up: You have been referred to Pulmonary for evaluation of sleep study, shortness of breath, daytime sleepiness. Call [phone] if you do not here anything in 1-2 weeks.   At [facility], you and your health needs are our priority.  As part of our continuing mission to provide you with exceptional heart care, we have created designated Provider Care Teams.  These Care Teams include your primary Cardiologist (physician) and Advanced Practice Providers (APPs -  Physician Assistants and Nurse Practitioners) who all work together to provide you with the care you need, when you need it.  We recommend signing up for the patient portal called "MyChart".  Sign up information is provided on this After Visit Summary.  MyChart is used to connect with patients for Virtual Visits  (Telemedicine).  Patients are able to view lab/test results, encounter notes, upcoming appointments, etc.  Non-urgent messages can be sent to your provider as well.   To learn more about what you can do with MyChart, go to [email or web address].    Your next appointment:   After testing.  The format for your next appointment:   In Person  Provider:   Kate Sable, MD    Ankle-Brachial Index Test Why am I having this test? The ankle-brachial index (ABI) test is used to diagnose peripheral vascular disease (PVD). PVD is also known as peripheral arterial disease (PAD). PVD is the blocking or hardening of the arteries anywhere within the circulatory system beyond the heart. PVD is caused by:  Cholesterol deposits in your blood vessels (atherosclerosis). This is the most common cause of this condition.  Irritation and swelling (inflammation) in the blood vessels.  Blood clots in the vessels. Cholesterol deposits cause arteries to narrow. Normal delivery of oxygen to your tissues is affected, causing muscle pain and fatigue. This is called claudication. PVD means that there may also be a buildup of cholesterol:  In your heart. This increases the risk of heart attacks.  In your brain. This increases the risk of strokes. What is being tested? The ankle-brachial index test measures the blood flow in your arms and legs. The blood flow will show if blood vessels in your legs have been narrowed by cholesterol deposits. How do I prepare for this test?  Wear loose clothing.  Do not use any tobacco products, including cigarettes, chewing tobacco, or e-cigarettes, for  at least 30 minutes before the test. What happens during the test?  1. You will lie down in a resting position. 2. Your health care provider will use a blood pressure machine and a small ultrasound device (Doppler) to measure the systolic pressures on your upper arms and ankles. Systolic pressure is the pressure inside  your arteries when your heart pumps. 3. Systolic pressure measurements will be taken several times, and at several points, on both the ankle and the arm. 4. Your health care provider will divide the highest systolic pressure of the ankle by the highest systolic pressure of the arm. The result is the ankle-brachial pressure ratio, or ABI. Sometimes this test will be repeated after you have exercised on a treadmill for 5 minutes. You may have leg pain during the exercise portion of the test if you suffer from PVD. If the index number drops after exercise, this may show that PVD is present. How are the results reported? Your test results will be reported as a value that shows the ratio of your ankle pressure to your arm pressure (ABI ratio). Your health care provider will compare your results to normal ranges that were established after testing a large group of people (reference ranges). Reference ranges may vary among labs and hospitals. For this test, a common reference range is:  ABI ratio of 0.9 to 1.3. What do the results mean? An ABI ratio that is below the reference range is considered abnormal and may indicate PVD in the legs. Talk with your health care provider about what your results mean. Questions to ask your health care provider Ask your health care provider, or the department that is doing the test:  When will my results be ready?  How will I get my results?  What are my treatment options?  What other tests do I need?  What are my next steps? Summary  The ankle-brachial index (ABI) test is used to diagnose peripheral vascular disease (PVD). PVD is also known as peripheral arterial disease (PAD).  The ankle-brachial index test measures the blood flow in your arms and legs.  The highest systolic pressure of the ankle is divided by the highest systolic pressure of the arm. The result is the ABI ratio.  An ABI ratio that is below 0.9 is considered abnormal and may indicate PVD  in the legs. This information is not intended to replace advice given to you by your health care provider. Make sure you discuss any questions you have with your health care provider. Document Revised: 06/16/2018 Document Reviewed: 05/18/2017 Elsevier Patient Education  2020 [employer].

## 2019-12-12 NOTE — Progress Notes (Signed)
Cardiology Office Note:    Date:  12/12/2019   ID:  Allison Dixon, DOB [birth year], MRN [id number]  PCP:  Patient, No Pcp Per  Cardiologist:  No primary care provider on file.  Electrophysiologist:  None   Referring MD: No ref. provider found   Chief Complaint  Patient presents with  . office visit    (pt mentioned not taking meds as should makes her feel sick) Pt states having a pain under neck area/ SOB/ pain in lower Right leg/ states hands become numb at times. Meds verbally reviewed w/ pt.    History of Present Illness:    Allison Dixon is a 59 y.o. female with a hx of hypertension who presents for follow-up.  She was last seen due to dyspnea on exertion for about 2 weeks denied chest pain with the symptoms.  Due to risk factors of hypertension, obesity, Lexiscan stress test was ordered.  Blood pressure was adequately controlled after medication adjustments.  Echocardiogram was also ordered.  Patient now presents for results.  Patient states having symptoms of leg/calf pain when she walks.  Symptoms improved with rest.  This has been going for the past several weeks..  She also describes daytime somnolence and a feeling of not feeling rested after sleeping.  She thinks she snores but not sure.   Past Medical History:  Diagnosis Date  . Hypertension     History reviewed. No pertinent surgical history.  Current Medications: Current Meds  Medication Sig  . ibuprofen (ADVIL) 200 MG tablet Take 400 mg by mouth every 6 (six) hours as needed for pain.  . naproxen (NAPROSYN) 500 MG tablet Take 1 tablet (500 mg total) by mouth 2 (two) times daily with a meal.  . ondansetron (ZOFRAN) 4 MG tablet Take 1 tablet (4 mg total) by mouth every 8 (eight) hours as needed.     Allergies:   Patient has no known allergies.   Social History   Socioeconomic History  . Marital status: Married    Spouse name: Not on file  . Number of children: Not on file  . Years of education: Not on file  .  Highest education level: Not on file  Occupational History  . Not on file  Tobacco Use  . Smoking status: Never Smoker  . Smokeless tobacco: Never Used  Substance and Sexual Activity  . Alcohol use: Never  . Drug use: Never  . Sexual activity: Yes  Other Topics Concern  . Not on file  Social History Narrative  . Not on file   Social Determinants of Health   Financial Resource Strain:   . Difficulty of Paying Living Expenses:   Food Insecurity:   . Worried About Charity fundraiser in the Last Year:   . Arboriculturist in the Last Year:   Transportation Needs:   . Film/video editor (Medical):   Marland Kitchen Lack of Transportation (Non-Medical):   Physical Activity:   . Days of Exercise per Week:   . Minutes of Exercise per Session:   Stress:   . Feeling of Stress :   Social Connections:   . Frequency of Communication with Friends and Family:   . Frequency of Social Gatherings with Friends and Family:   . Attends Religious Services:   . Active Member of Clubs or Organizations:   . Attends Archivist Meetings:   Marland Kitchen Marital Status:      Family History: The patient's family history includes Heart  attack in her mother.  ROS:   Please see the history of present illness.     All other systems reviewed and are negative.  EKGs/Labs/Other Studies Reviewed:    The following studies were reviewed today:   EKG:  EKG is  ordered today.  The ekg ordered today demonstrates normal sinus rhythm, nonspecific T wave changes.  Recent Labs: 09/22/2019: BUN 14; Creatinine, Ser 0.98; Hemoglobin 13.7; Platelets 178; Potassium 3.7; Sodium 139  Recent Lipid Panel No results found for: CHOL, TRIG, HDL, CHOLHDL, VLDL, LDLCALC, LDLDIRECT  Physical Exam:    VS:  BP 140/90 (BP Location: Left Arm, Patient Position: Sitting, Cuff Size: Large)   Pulse 68   Ht 5\' 2"  (1.575 m)   Wt 240 lb (108.9 kg)   SpO2 98%   BMI 43.90 kg/m     Wt Readings from Last 3 Encounters:  12/12/19 240 lb  (108.9 kg)  10/06/19 235 lb 8 oz (106.8 kg)  09/22/19 244 lb (110.7 kg)     GEN:  Well nourished, well developed in no acute distress, obese HEENT: Normal NECK: No JVD; No carotid bruits LYMPHATICS: No lymphadenopathy CARDIAC: RRR, no murmurs, rubs, gallops RESPIRATORY:  Clear to auscultation without rales, wheezing or rhonchi  ABDOMEN: Soft, non-tender, distended MUSCULOSKELETAL:  No edema; No deformity  SKIN: Warm and dry NEUROLOGIC:  Alert and oriented x 3 PSYCHIATRIC:  Normal affect   ASSESSMENT:    1. Dyspnea on exertion   2. Morbid obesity ([place])   3. Essential hypertension   4. Claudication in peripheral vascular disease ([place])   5. Daytime somnolence    PLAN:    In order of problems listed above:  1. Patient complains of dyspnea on exertion over the past 1 to 2 weeks.  Has risk factors of hypertension and obesity.  Echocardiogram showed normal systolic and diastolic function.  EF 55 to 60%.  Lexiscan myocardial perfusion imaging stress test did not reveal any ischemia.  Patient reassured.  Symptoms of dyspnea likely due to deconditioning/morbid obesity.  2. Patient is morbidly obese.  Weight loss advised.. 3. History of hypertension, blood pressure is adequately controlled.  Continue amlodipine 5 mg daily. 4. Patient describes symptoms of claudication.  We will plan to get an ABI. 5. She also describes daytime somnolence.  In light of morbid obesity patient likely has sleep apnea.  Will refer to pulmonary medicine for further input/sleep study.   Follow-up after ankle-brachial index.  Total encounter time 45 minutes  Greater than 50% was spent in counseling and coordination of care with the patient. Time spent counseling patient on the benefits of weight loss, etiology for shortness of breath, diagnosis of sleep apnea and possible management, indications for ABI.   This note was generated in part or whole with voice recognition software. Voice recognition is usually  quite accurate but there are transcription errors that can and very often do occur. I apologize for any typographical errors that were not detected and corrected.  Medication Adjustments/Labs and Tests Ordered: Current medicines are reviewed at length with the patient today.  Concerns regarding medicines are outlined above.  Orders Placed This Encounter  Procedures  . Ambulatory referral to Pulmonology  . EKG 12-Lead  . VAS [place] ABI WITH/WO TBI  . VAS [place] LOWER EXTREMITY ARTERIAL DUPLEX   No orders of the defined types were placed in this encounter.   Patient Instructions  Medication Instructions:  Your physician recommends that you continue on your current medications as directed. Please  refer to the Current Medication list given to you today.  *If you need a refill on your cardiac medications before your next appointment, please call your pharmacy*   Lab Work: none If you have labs (blood work) drawn today and your tests are completely normal, you will receive your results only by: Marland Kitchen MyChart Message (if you have MyChart) OR . A paper copy in the mail If you have any lab test that is abnormal or we need to change your treatment, we will call you to review the results.   Testing/Procedures: Your physician has requested that you have an ankle brachial index (ABI). During this test an ultrasound and blood pressure cuff are used to evaluate the arteries that supply the arms and legs with blood. Allow thirty minutes for this exam. There are no restrictions or special instructions.  Your physician has requested that you have a lower extremity arterial doppler- During this test, ultrasound is used to evaluate arterial blood flow in the legs. Allow approximately one hour for this exam.    Follow-Up: You have been referred to Pulmonary for evaluation of sleep study, shortness of breath, daytime sleepiness. Call [phone] if you do not here anything in 1-2 weeks.   At [facility],  you and your health needs are our priority.  As part of our continuing mission to provide you with exceptional heart care, we have created designated Provider Care Teams.  These Care Teams include your primary Cardiologist (physician) and Advanced Practice Providers (APPs -  Physician Assistants and Nurse Practitioners) who all work together to provide you with the care you need, when you need it.  We recommend signing up for the patient portal called "MyChart".  Sign up information is provided on this After Visit Summary.  MyChart is used to connect with patients for Virtual Visits (Telemedicine).  Patients are able to view lab/test results, encounter notes, upcoming appointments, etc.  Non-urgent messages can be sent to your provider as well.   To learn more about what you can do with MyChart, go to [email or web address].    Your next appointment:   After testing.  The format for your next appointment:   In Person  Provider:   Kate Sable, MD    Ankle-Brachial Index Test Why am I having this test? The ankle-brachial index (ABI) test is used to diagnose peripheral vascular disease (PVD). PVD is also known as peripheral arterial disease (PAD). PVD is the blocking or hardening of the arteries anywhere within the circulatory system beyond the heart. PVD is caused by:  Cholesterol deposits in your blood vessels (atherosclerosis). This is the most common cause of this condition.  Irritation and swelling (inflammation) in the blood vessels.  Blood clots in the vessels. Cholesterol deposits cause arteries to narrow. Normal delivery of oxygen to your tissues is affected, causing muscle pain and fatigue. This is called claudication. PVD means that there may also be a buildup of cholesterol:  In your heart. This increases the risk of heart attacks.  In your brain. This increases the risk of strokes. What is being tested? The ankle-brachial index test measures the blood flow in your  arms and legs. The blood flow will show if blood vessels in your legs have been narrowed by cholesterol deposits. How do I prepare for this test?  Wear loose clothing.  Do not use any tobacco products, including cigarettes, chewing tobacco, or e-cigarettes, for at least 30 minutes before the test. What happens during the test?  1. You  will lie down in a resting position. 2. Your health care provider will use a blood pressure machine and a small ultrasound device (Doppler) to measure the systolic pressures on your upper arms and ankles. Systolic pressure is the pressure inside your arteries when your heart pumps. 3. Systolic pressure measurements will be taken several times, and at several points, on both the ankle and the arm. 4. Your health care provider will divide the highest systolic pressure of the ankle by the highest systolic pressure of the arm. The result is the ankle-brachial pressure ratio, or ABI. Sometimes this test will be repeated after you have exercised on a treadmill for 5 minutes. You may have leg pain during the exercise portion of the test if you suffer from PVD. If the index number drops after exercise, this may show that PVD is present. How are the results reported? Your test results will be reported as a value that shows the ratio of your ankle pressure to your arm pressure (ABI ratio). Your health care provider will compare your results to normal ranges that were established after testing a large group of people (reference ranges). Reference ranges may vary among labs and hospitals. For this test, a common reference range is:  ABI ratio of 0.9 to 1.3. What do the results mean? An ABI ratio that is below the reference range is considered abnormal and may indicate PVD in the legs. Talk with your health care provider about what your results mean. Questions to ask your health care provider Ask your health care provider, or the department that is doing the test:  When will  my results be ready?  How will I get my results?  What are my treatment options?  What other tests do I need?  What are my next steps? Summary  The ankle-brachial index (ABI) test is used to diagnose peripheral vascular disease (PVD). PVD is also known as peripheral arterial disease (PAD).  The ankle-brachial index test measures the blood flow in your arms and legs.  The highest systolic pressure of the ankle is divided by the highest systolic pressure of the arm. The result is the ABI ratio.  An ABI ratio that is below 0.9 is considered abnormal and may indicate PVD in the legs. This information is not intended to replace advice given to you by your health care provider. Make sure you discuss any questions you have with your health care provider. Document Revised: 06/16/2018 Document Reviewed: 05/18/2017 Elsevier Patient Education  2020 [place], Kate Sable, MD  12/12/2019 11:07 AM    [place]

## 2020-01-15 ENCOUNTER — Ambulatory Visit (INDEPENDENT_AMBULATORY_CARE_PROVIDER_SITE_OTHER): Payer: Medicaid Other

## 2020-01-15 ENCOUNTER — Other Ambulatory Visit: Payer: Self-pay

## 2020-01-15 DIAGNOSIS — I10 Essential (primary) hypertension: Secondary | ICD-10-CM | POA: Diagnosis not present

## 2020-01-15 DIAGNOSIS — I739 Peripheral vascular disease, unspecified: Secondary | ICD-10-CM | POA: Diagnosis not present

## 2020-01-22 ENCOUNTER — Encounter: Payer: Self-pay | Admitting: Cardiology

## 2020-01-22 ENCOUNTER — Ambulatory Visit (INDEPENDENT_AMBULATORY_CARE_PROVIDER_SITE_OTHER): Payer: Medicaid Other | Admitting: Cardiology

## 2020-01-22 VITALS — BP 130/84 | HR 84 | Ht 62.0 in | Wt 242.2 lb

## 2020-01-22 DIAGNOSIS — R400 Somnolence: Secondary | ICD-10-CM

## 2020-01-22 DIAGNOSIS — I739 Peripheral vascular disease, unspecified: Secondary | ICD-10-CM

## 2020-01-22 NOTE — Progress Notes (Signed)
Cardiology Office Note:    Date:  01/22/2020   ID:  Allison Dixon, DOB 11/19/1960, MRN [id number]  PCP:  Patient, No Pcp Per  Cardiologist:  No primary care provider on file.  Electrophysiologist:  None   Referring MD: Kate Sable, MD   Chief Complaint  Patient presents with  . office visit    F/U after LEA study-Patient reports SOB; Meds verbally reviewed with patient.    History of Present Illness:    Allison Dixon is a 59 y.o. female with a hx of obesity, hypertension who presents for follow-up.  She had complaints of leg or calf pain with walking, improved with rest, consistent with claudication.  Ankle-brachial index, lower extremity arterial ultrasound was performed.  Also with history of shortness of breath with exertion, snoring.  Work-up with echo and Lexiscan have been normal.  Symptoms likely due to obesity.  Underlying, undiagnosed sleep apnea could also be contributing.  Patient given referral to pulmonary medicine after last visit to evaluate for sleep apnea.  She has appointment to follow-up with pulmonary medicine.    Past Medical History:  Diagnosis Date  . Hypertension     Past Surgical History:  Procedure Laterality Date  . DILATION AND CURETTAGE, DIAGNOSTIC / THERAPEUTIC      Current Medications: Current Meds  Medication Sig  . naproxen (NAPROSYN) 500 MG tablet Take 1 tablet (500 mg total) by mouth 2 (two) times daily with a meal.  . ondansetron (ZOFRAN) 4 MG tablet Take 1 tablet (4 mg total) by mouth every 8 (eight) hours as needed.     Allergies:   Patient has no known allergies.   Social History   Socioeconomic History  . Marital status: Married    Spouse name: Not on file  . Number of children: Not on file  . Years of education: Not on file  . Highest education level: Not on file  Occupational History  . Not on file  Tobacco Use  . Smoking status: Never Smoker  . Smokeless tobacco: Never Used  Substance and Sexual Activity  .  Alcohol use: Never  . Drug use: Never  . Sexual activity: Yes  Other Topics Concern  . Not on file  Social History Narrative  . Not on file   Social Determinants of Health   Financial Resource Strain:   . Difficulty of Paying Living Expenses:   Food Insecurity:   . Worried About Charity fundraiser in the Last Year:   . Arboriculturist in the Last Year:   Transportation Needs:   . Film/video editor (Medical):   Marland Kitchen Lack of Transportation (Non-Medical):   Physical Activity:   . Days of Exercise per Week:   . Minutes of Exercise per Session:   Stress:   . Feeling of Stress :   Social Connections:   . Frequency of Communication with Friends and Family:   . Frequency of Social Gatherings with Friends and Family:   . Attends Religious Services:   . Active Member of Clubs or Organizations:   . Attends Archivist Meetings:   Marland Kitchen Marital Status:      Family History: The patient's family history includes Heart attack in her mother.  ROS:   Please see the history of present illness.     All other systems reviewed and are negative.  EKGs/Labs/Other Studies Reviewed:    The following studies were reviewed today:   EKG:  EKG is  ordered today.  The ekg ordered today demonstrates normal sinus rhythm, incomplete left bundle branch block..  Recent Labs: 09/22/2019: BUN 14; Creatinine, Ser 0.98; Hemoglobin 13.7; Platelets 178; Potassium 3.7; Sodium 139  Recent Lipid Panel No results found for: CHOL, TRIG, HDL, CHOLHDL, VLDL, LDLCALC, LDLDIRECT  Physical Exam:    VS:  BP 130/84 (BP Location: Left Arm, Patient Position: Sitting, Cuff Size: Large)   Pulse 84   Ht 5\' 2"  (1.575 m)   Wt 242 lb 4 oz (109.9 kg)   SpO2 95%   BMI 44.31 kg/m     Wt Readings from Last 3 Encounters:  01/22/20 242 lb 4 oz (109.9 kg)  12/12/19 240 lb (108.9 kg)  10/06/19 235 lb 8 oz (106.8 kg)     GEN:  Well nourished, well developed in no acute distress, obese HEENT: Normal NECK: No  JVD; No carotid bruits LYMPHATICS: No lymphadenopathy CARDIAC: RRR, no murmurs, rubs, gallops RESPIRATORY:  Clear to auscultation without rales, wheezing or rhonchi  ABDOMEN: Soft, non-tender, distended MUSCULOSKELETAL:  No edema; No deformity  SKIN: Warm and dry NEUROLOGIC:  Alert and oriented x 3 PSYCHIATRIC:  Normal affect   ASSESSMENT:    1. Claudication in peripheral vascular disease ([place])   2. Morbid obesity ([place])   3. Daytime somnolence    PLAN:    In order of problems listed above:   1. Patient with history of claudication, lower extremity arterial ultrasound did not show any evidence for significant arterial disease.  Normal toe brachial index bilaterally.  Not sure if arthritis is playing a role.  Weight loss advised. 2. Patient is morbidly obese.  Weight loss advised.  Over 5 minutes spent counseling patient.  This will help with symptoms of shortness of breath and also snoring/daytime fatigue.   Follow-up in 1 year  Total encounter time 45 minutes  Greater than 50% was spent in counseling and coordination of care with the patient. Time spent counseling patient on the benefits of weight loss, etiology for shortness of breath, diagnosis of sleep apnea and possible management, indications for ABI.   This note was generated in part or whole with voice recognition software. Voice recognition is usually quite accurate but there are transcription errors that can and very often do occur. I apologize for any typographical errors that were not detected and corrected.  Medication Adjustments/Labs and Tests Ordered: Current medicines are reviewed at length with the patient today.  Concerns regarding medicines are outlined above.  Orders Placed This Encounter  Procedures  . EKG 12-Lead   No orders of the defined types were placed in this encounter.   Patient Instructions  Medication Instructions:  Your physician recommends that you continue on your current medications as  directed. Please refer to the Current Medication list given to you today.  *If you need a refill on your cardiac medications before your next appointment, please call your pharmacy*   Lab Work: None ordered If you have labs (blood work) drawn today and your tests are completely normal, you will receive your results only by: Marland Kitchen MyChart Message (if you have MyChart) OR . A paper copy in the mail If you have any lab test that is abnormal or we need to change your treatment, we will call you to review the results.   Testing/Procedures: None ordered   Follow-Up: At [facility], you and your health needs are our priority.  As part of our continuing mission to provide you with exceptional heart care, we have created designated Provider  Care Teams.  These Care Teams include your primary Cardiologist (physician) and Advanced Practice Providers (APPs -  Physician Assistants and Nurse Practitioners) who all work together to provide you with the care you need, when you need it.  We recommend signing up for the patient portal called "MyChart".  Sign up information is provided on this After Visit Summary.  MyChart is used to connect with patients for Virtual Visits (Telemedicine).  Patients are able to view lab/test results, encounter notes, upcoming appointments, etc.  Non-urgent messages can be sent to your provider as well.   To learn more about what you can do with MyChart, go to [email or web address].    Your next appointment:   12 month(s)  The format for your next appointment:   In Person  Provider:    You may see  Dr. Garen Lah or one of the following Advanced Practice Providers on your designated Care Team:    Murray Hodgkins, NP  Christell Faith, PA-C  Marrianne Mood, PA-C    Other Instructions N/A     Signed, Kate Sable, MD  01/22/2020 10:57 AM    [place]

## 2020-01-22 NOTE — Patient Instructions (Signed)
Medication Instructions:  Your physician recommends that you continue on your current medications as directed. Please refer to the Current Medication list given to you today.  *If you need a refill on your cardiac medications before your next appointment, please call your pharmacy*   Lab Work: None ordered If you have labs (blood work) drawn today and your tests are completely normal, you will receive your results only by: Marland Kitchen MyChart Message (if you have MyChart) OR . A paper copy in the mail If you have any lab test that is abnormal or we need to change your treatment, we will call you to review the results.   Testing/Procedures: None ordered   Follow-Up: At [facility], you and your health needs are our priority.  As part of our continuing mission to provide you with exceptional heart care, we have created designated Provider Care Teams.  These Care Teams include your primary Cardiologist (physician) and Advanced Practice Providers (APPs -  Physician Assistants and Nurse Practitioners) who all work together to provide you with the care you need, when you need it.  We recommend signing up for the patient portal called "MyChart".  Sign up information is provided on this After Visit Summary.  MyChart is used to connect with patients for Virtual Visits (Telemedicine).  Patients are able to view lab/test results, encounter notes, upcoming appointments, etc.  Non-urgent messages can be sent to your provider as well.   To learn more about what you can do with MyChart, go to [email or web address].    Your next appointment:   12 month(s)  The format for your next appointment:   In Person  Provider:    You may see  Dr. Garen Lah or one of the following Advanced Practice Providers on your designated Care Team:    Murray Hodgkins, NP  Christell Faith, PA-C  Marrianne Mood, PA-C    Other Instructions N/A

## 2020-01-25 ENCOUNTER — Ambulatory Visit
Admission: RE | Admit: 2020-01-25 | Discharge: 2020-01-25 | Disposition: A | Discharge status: Home / Self Care | Payer: Medicaid Other | Source: Ambulatory Visit | Attending: Pulmonary Disease | Admitting: Pulmonary Disease

## 2020-01-25 ENCOUNTER — Ambulatory Visit (INDEPENDENT_AMBULATORY_CARE_PROVIDER_SITE_OTHER): Payer: Medicaid Other | Admitting: Pulmonary Disease

## 2020-01-25 ENCOUNTER — Other Ambulatory Visit: Payer: Self-pay

## 2020-01-25 ENCOUNTER — Encounter: Payer: Self-pay | Admitting: Pulmonary Disease

## 2020-01-25 VITALS — BP 140/90 | HR 77 | Temp 97.3°F | Ht 63.0 in | Wt 242.8 lb

## 2020-01-25 DIAGNOSIS — R0683 Snoring: Secondary | ICD-10-CM

## 2020-01-25 DIAGNOSIS — R0609 Other forms of dyspnea: Secondary | ICD-10-CM

## 2020-01-25 DIAGNOSIS — Z6841 Body Mass Index (BMI) 40.0 and over, adult: Secondary | ICD-10-CM

## 2020-01-25 DIAGNOSIS — R0600 Dyspnea, unspecified: Secondary | ICD-10-CM

## 2020-01-25 NOTE — Patient Instructions (Signed)
Will schedule chest xray, pulmonary function test, and home sleep study  Will call to schedule follow up appointment after tests are completed

## 2020-01-25 NOTE — Progress Notes (Signed)
[facility]  Chief Complaint  Patient presents with  . Consult    Patient has shortness of breath with exertion that has recently got worse. Patient has shortness of breath when walking, showering, getting dressed, etc. Denies cough. Patient has trouble going to sleep and staying a sleep. Patient snores denies waking up gasping for air. She is tired during the day.     Constitutional:  BP 140/90 (BP Location: Left Arm, Patient Position: Sitting, Cuff Size: Large)   Pulse 77   Temp (!) 97.3 F (36.3 C) (Temporal)   Ht 5\' 3"  (1.6 m)   Wt 242 lb 12.8 oz (110.1 kg)   SpO2 98%   BMI 43.01 kg/m   Past Medical History:  HTN  Summary:  Allison Dixon is a 59 y.o. female with daytime sleepiness and dyspnea.  Subjective:   She has noticed trouble with her breathing for a while.  This has been getting worse.  She is okay at rest.  She gets winded while walking.   She feels her heart racing when this happens.  She recovers after about 5 to 10 minutes of rest.  She is not having cough, wheeze, sputum, chest pain, or leg swelling.  No history of asthma, pneumonia, or thromboembolic disease.  Never smoked cigarettes.  She is a Agricultural engineer.  She was seen by cardiology recently.  No evidence for peripheral artery disease, and Echo was normal.  Labs from 09/22/19 showed Hb 13.7, CO2 25, Creatinine 0.98.  CXR from 07/28/19 was negative.  She goes to sleep at 10 pm.  She falls asleep quickly.  She wakes up several times to use the bathroom.  She gets out of bed at 8 am.  She feels tired in the morning.  She denies morning headache.  She does not use anything to help her fall sleep or stay awake.  She snores and wakes up feeling choked.  Can fall asleep easily during the day.  She denies sleep walking, sleep talking, bruxism, or nightmares.  There is no history of restless legs.  She denies sleep hallucinations, sleep paralysis, or cataplexy.  The Epworth score is 10  out of 24.   Physical Exam:   Appearance - well kempt  ENMT - no sinus tenderness, no nasal discharge, no oral exudate, Mallampati 3  Respiratory - no wheeze, or rales  CV - regular rate and rhythm, no murmurs  GI - soft, non tender  Lymph - no adenopathy noted in neck  Ext - no edema  Skin - no rashes  Neuro - normal strength, oriented x 3  Psych - normal mood and affect   Assessment/Plan:   Dyspnea on exertion. - most likely related to obesity and deconditioning - will repeat chest xray and arrange for PFT to exclude underlying pulmonary condition - she was recently assessed by cardiology, and this assessment was negative  Snoring with excessive daytime sleepiness. - will need to arrange for a home sleep study pending insurance approval  Obesity. - discussed how weight can impact sleep and risk for sleep disordered breathing - discussed options to assist with weight loss: combination of diet modification, cardiovascular and strength training exercises  Cardiovascular risk. - had an extensive discussion regarding the adverse health consequences related to untreated sleep disordered breathing - specifically discussed the risks for hypertension, coronary artery disease, cardiac dysrhythmias, cerebrovascular disease, and diabetes - lifestyle modification discussed  Safe driving practices. - discussed how sleep disruption can increase  risk of accidents, particularly when driving - safe driving practices were discussed  Therapies for obstructive sleep apnea. - if the sleep study shows significant sleep apnea, then various therapies for treatment were reviewed: CPAP, oral appliance, and surgical interventions   A total of 47 minutes addressing patient care on the day of the visit.  Follow up:  Patient Instructions  Will schedule chest xray, pulmonary function test, and home sleep study  Will call to schedule follow up appointment after tests are  completed   Signature:  Chesley Mires, MD [place] Pager: [phone] 01/25/2020, 10:19 AM  Flow Sheet     Pulmonary tests:    Sleep tests:    Cardiac tests:  Echo 11/18/19 >> EF 55 to 60%  Medications:   Allergies as of 01/25/2020   No Known Allergies     Medication List       Accurate as of Jan 25, 2020 10:19 AM. If you have any questions, ask your nurse or doctor.        ibuprofen 200 MG tablet Commonly known as: ADVIL Take 400 mg by mouth every 6 (six) hours as needed for pain.   naproxen 500 MG tablet Commonly known as: Naprosyn Take 1 tablet (500 mg total) by mouth 2 (two) times daily with a meal.   ondansetron 4 MG tablet Commonly known as: Zofran Take 1 tablet (4 mg total) by mouth every 8 (eight) hours as needed.       Past Surgical History:  She  has a past surgical history that includes Dilation and curettage, diagnostic / therapeutic.  Family History:  Her family history includes Heart attack in her mother.  Social History:  She  reports that she has never smoked. She has never used smokeless tobacco. She reports that she does not drink alcohol or use drugs.

## 2020-01-29 ENCOUNTER — Telehealth: Payer: Self-pay | Admitting: Pulmonary Disease

## 2020-01-29 NOTE — Telephone Encounter (Signed)
DG Chest 2 View  Result Date: 01/25/2020 CLINICAL DATA:  Shortness of breath EXAM: CHEST - 2 VIEW COMPARISON:  July 28, 2019 FINDINGS: Lungs are clear. Heart size and pulmonary vascular normal. No evident adenopathy. No bone lesions. IMPRESSION: No abnormality noted. Electronically Signed   By: Lowella Grip III M.D.   On: 01/25/2020 14:15    Please let her know that her chest xray is normal.

## 2020-02-06 NOTE — Telephone Encounter (Signed)
Called and spoke with patient. She verbalized understanding. She wanted to know since her CXR was normal, did she need to complete her PFT on 6/15. I advised her that she would need to keep this appt. She verbalized understanding. Nothing further needed at time of call.

## 2020-02-15 ENCOUNTER — Telehealth: Payer: Self-pay

## 2020-02-15 NOTE — Telephone Encounter (Signed)
Lm to relay date/time of covid prior PFT.  02/19/2020 [facility]

## 2020-02-16 NOTE — Telephone Encounter (Signed)
Pt is aware of date/time of covid test and voiced her understanding.  Nothing further is needed.  

## 2020-02-16 NOTE — Telephone Encounter (Signed)
Lm x2 for pt.  

## 2020-02-19 ENCOUNTER — Other Ambulatory Visit: Payer: Self-pay

## 2020-02-19 ENCOUNTER — Other Ambulatory Visit
Admission: RE | Admit: 2020-02-19 | Discharge: 2020-02-19 | Disposition: A | Discharge status: Home / Self Care | Payer: Medicaid Other | Source: Ambulatory Visit | Attending: Pulmonary Disease | Admitting: Pulmonary Disease

## 2020-02-19 DIAGNOSIS — Z01812 Encounter for preprocedural laboratory examination: Secondary | ICD-10-CM | POA: Diagnosis not present

## 2020-02-19 DIAGNOSIS — Z20822 Contact with and (suspected) exposure to covid-19: Secondary | ICD-10-CM | POA: Insufficient documentation

## 2020-02-20 ENCOUNTER — Ambulatory Visit: Payer: Medicaid Other | Attending: Pulmonary Disease

## 2020-02-20 DIAGNOSIS — R0600 Dyspnea, unspecified: Secondary | ICD-10-CM

## 2020-02-20 DIAGNOSIS — R0609 Other forms of dyspnea: Secondary | ICD-10-CM

## 2020-02-20 LAB — SARS CORONAVIRUS 2 (TAT 6-24 HRS): SARS Coronavirus 2: NEGATIVE

## 2020-02-20 MED ORDER — ALBUTEROL SULFATE (2.5 MG/3ML) 0.083% IN NEBU
2.5000 mg | INHALATION_SOLUTION | Freq: Once | RESPIRATORY_TRACT | Status: AC
  Administered 2020-02-20: 2.5 mg via RESPIRATORY_TRACT
  Filled 2020-02-20: qty 3

## 2020-02-26 ENCOUNTER — Telehealth: Payer: Self-pay

## 2020-02-26 NOTE — Telephone Encounter (Signed)
Pt is aware of date/time of covid test and voiced her understanding.  Nothing further is needed.  

## 2020-03-01 ENCOUNTER — Other Ambulatory Visit: Payer: Self-pay

## 2020-03-01 ENCOUNTER — Other Ambulatory Visit
Admission: RE | Admit: 2020-03-01 | Discharge: 2020-03-01 | Disposition: A | Discharge status: Home / Self Care | Payer: Medicaid Other | Source: Ambulatory Visit | Attending: Pulmonary Disease | Admitting: Pulmonary Disease

## 2020-03-01 DIAGNOSIS — Z01812 Encounter for preprocedural laboratory examination: Secondary | ICD-10-CM | POA: Insufficient documentation

## 2020-03-01 DIAGNOSIS — Z20822 Contact with and (suspected) exposure to covid-19: Secondary | ICD-10-CM | POA: Diagnosis not present

## 2020-03-02 LAB — SARS CORONAVIRUS 2 (TAT 6-24 HRS): SARS Coronavirus 2: NEGATIVE

## 2020-03-05 ENCOUNTER — Ambulatory Visit: Payer: Medicaid Other | Attending: Pulmonary Disease

## 2020-03-20 ENCOUNTER — Ambulatory Visit: Payer: Medicaid Other | Attending: Pulmonary Disease

## 2020-04-08 ENCOUNTER — Other Ambulatory Visit: Payer: Medicaid Other

## 2020-04-11 ENCOUNTER — Ambulatory Visit: Payer: Medicaid Other | Attending: Pulmonary Disease

## 2020-04-11 DIAGNOSIS — G4733 Obstructive sleep apnea (adult) (pediatric): Secondary | ICD-10-CM | POA: Insufficient documentation

## 2020-04-11 DIAGNOSIS — G4761 Periodic limb movement disorder: Secondary | ICD-10-CM | POA: Insufficient documentation

## 2020-04-12 ENCOUNTER — Other Ambulatory Visit: Payer: Self-pay

## 2020-04-12 ENCOUNTER — Ambulatory Visit (INDEPENDENT_AMBULATORY_CARE_PROVIDER_SITE_OTHER): Payer: Medicaid Other | Admitting: Certified Nurse Midwife

## 2020-04-12 ENCOUNTER — Encounter: Payer: Self-pay | Admitting: Certified Nurse Midwife

## 2020-04-12 VITALS — BP 137/90 | HR 81 | Ht 63.0 in | Wt 241.1 lb

## 2020-04-12 DIAGNOSIS — N939 Abnormal uterine and vaginal bleeding, unspecified: Secondary | ICD-10-CM | POA: Diagnosis not present

## 2020-04-12 NOTE — Progress Notes (Signed)
Pt present for heavy irregular bleeding. Pt states she has been bleeding for 6/7 months on and off.

## 2020-04-12 NOTE — Patient Instructions (Signed)
Endometrial Biopsy  Endometrial biopsy is a procedure in which a tissue sample is taken from inside the uterus. The sample is taken from the endometrium, which is the lining of the uterus. The tissue sample is then checked under a microscope to see if the tissue is normal or abnormal. This procedure helps to determine where you are in your menstrual cycle and how hormone levels are affecting the lining of the uterus. This procedure may also be used to evaluate uterine bleeding or to diagnose endometrial cancer, endometrial tuberculosis, polyps, or other inflammatory conditions. Tell a health care provider about:  Any allergies you have.  All medicines you are taking, including vitamins, herbs, eye drops, creams, and over-the-counter medicines.  Any problems you or family members have had with anesthetic medicines.  Any blood disorders you have.  Any surgeries you have had.  Any medical conditions you have.  Whether you are pregnant or may be pregnant. What are the risks? Generally, this is a safe procedure. However, problems may occur, including:  Bleeding.  Pelvic infection.  Puncture of the wall of the uterus with the biopsy device (rare). What happens before the procedure?  Keep a record of your menstrual cycles as told by your health care provider. You may need to schedule your procedure for a specific time in your cycle.  You may want to bring a sanitary pad to wear after the procedure.  Ask your health care provider about: ? Changing or stopping your regular medicines. This is especially important if you are taking diabetes medicines or blood thinners. ? Taking medicines such as aspirin and ibuprofen. These medicines can thin your blood. Do not take these medicines before your procedure if your health care provider instructs you not to.  Plan to have someone take you home from the hospital or clinic. What happens during the procedure?  To lower your risk of  infection: ? Your health care team will wash or sanitize their hands.  You will lie on an exam table with your feet and legs supported as in a pelvic exam.  Your health care provider will insert an instrument (speculum) into your vagina to see your cervix.  Your cervix will be cleansed with an antiseptic solution.  A medicine (local anesthetic) will be used to numb the cervix.  A forceps instrument (tenaculum) will be used to hold your cervix steady for the biopsy.  A thin, rod-like instrument (uterine sound) will be inserted through your cervix to determine the length of your uterus and the location where the biopsy sample will be removed.  A thin, flexible tube (catheter) will be inserted through your cervix and into the uterus. The catheter will be used to collect the biopsy sample from your endometrial tissue.  The catheter and speculum will then be removed, and the tissue sample will be sent to a lab for examination. What happens after the procedure?  You will rest in a recovery area until you are ready to go home.  You may have mild cramping and a small amount of vaginal bleeding. This is normal.  It is up to you to get the results of your procedure. Ask your health care provider, or the department that is doing the procedure, when your results will be ready. Summary  Endometrial biopsy is a procedure in which a tissue sample is taken from the endometrium, which is the lining of the uterus.  This procedure may help to diagnose menstrual cycle problems, abnormal bleeding, or other conditions affecting   the endometrium.  Before the procedure, keep a record of your menstrual cycles as told by your health care provider.  The tissue sample that is removed will be checked under a microscope to see if it is normal or abnormal. This information is not intended to replace advice given to you by your health care provider. Make sure you discuss any questions you have with your health care  provider. Document Revised: 08/06/2017 Document Reviewed: 09/09/2016 Elsevier Patient Education  [place].   Abnormal Uterine Bleeding Abnormal uterine bleeding is unusual bleeding from the uterus. It includes:  Bleeding or spotting between periods.  Bleeding after sex.  Bleeding that is heavier than normal.  Periods that last longer than usual.  Bleeding after menopause. Abnormal uterine bleeding can affect women at various stages in life, including teenagers, women in their reproductive years, pregnant women, and women who have reached menopause. Common causes of abnormal uterine bleeding include:  Pregnancy.  Growths of tissue (polyps).  A noncancerous tumor in the uterus (fibroid).  Infection.  Cancer.  Hormonal imbalances. Any type of abnormal bleeding should be evaluated by a health care provider. Many cases are minor and simple to treat, while others are more serious. Treatment will depend on the cause of the bleeding. Follow these instructions at home:  Monitor your condition for any changes.  Do not use tampons, douche, or have sex if told by your health care provider.  Change your pads often.  Get regular exams that include pelvic exams and cervical cancer screening.  Keep all follow-up visits as told by your health care provider. This is important. Contact a health care provider if:  Your bleeding lasts for more than one week.  You feel dizzy at times.  You feel nauseous or you vomit. Get help right away if:  You pass out.  Your bleeding soaks through a pad every hour.  You have abdominal pain.  You have a fever.  You become sweaty or weak.  You pass large blood clots from your vagina. Summary  Abnormal uterine bleeding is unusual bleeding from the uterus.  Any type of abnormal bleeding should be evaluated by a health care provider. Many cases are minor and simple to treat, while others are more serious.  Treatment will depend  on the cause of the bleeding. This information is not intended to replace advice given to you by your health care provider. Make sure you discuss any questions you have with your health care provider. Document Revised: 12/01/2017 Document Reviewed: 09/25/2016 Elsevier Patient Education  [place].

## 2020-04-12 NOTE — Progress Notes (Signed)
GYN ENCOUNTER NOTE  Subjective:       Allison Dixon is a 59 y.o. No obstetric history on file. female is here for gynecologic evaluation of the following issues:  1. Irregular heavy vaginal bleeding for the last six (6) or seven (7) months  Denies difficulty breathing or respiratory distress, chest pain, abdominal pain, dysuria, and leg pain or swelling.    Gynecologic History  No LMP recorded (lmp unknown).  Contraception: abstinence  Last Pap: unknown  Last mammogram: 06/2014. Results were:  normal  Obstetric History  OB History  No obstetric history on file.    Past Medical History:  Diagnosis Date  . Hypertension     Past Surgical History:  Procedure Laterality Date  . DILATION AND CURETTAGE, DIAGNOSTIC / THERAPEUTIC      Current Outpatient Medications on File Prior to Visit  Medication Sig Dispense Refill  . ibuprofen (ADVIL) 200 MG tablet Take 400 mg by mouth every 6 (six) hours as needed for pain.     No current facility-administered medications on file prior to visit.    No Known Allergies  Social History   Socioeconomic History  . Marital status: Married    Spouse name: Not on file  . Number of children: Not on file  . Years of education: Not on file  . Highest education level: Not on file  Occupational History  . Not on file  Tobacco Use  . Smoking status: Never Smoker  . Smokeless tobacco: Never Used  Vaping Use  . Vaping Use: Never used  Substance and Sexual Activity  . Alcohol use: Never  . Drug use: Never  . Sexual activity: Not Currently  Other Topics Concern  . Not on file  Social History Narrative  . Not on file   Social Determinants of Health   Financial Resource Strain:   . Difficulty of Paying Living Expenses:   Food Insecurity:   . Worried About Charity fundraiser in the Last Year:   . Arboriculturist in the Last Year:   Transportation Needs:   . Film/video editor (Medical):   Marland Kitchen Lack of Transportation  (Non-Medical):   Physical Activity:   . Days of Exercise per Week:   . Minutes of Exercise per Session:   Stress:   . Feeling of Stress :   Social Connections:   . Frequency of Communication with Friends and Family:   . Frequency of Social Gatherings with Friends and Family:   . Attends Religious Services:   . Active Member of Clubs or Organizations:   . Attends Archivist Meetings:   Marland Kitchen Marital Status:   Intimate Partner Violence:   . Fear of Current or Ex-Partner:   . Emotionally Abused:   Marland Kitchen Physically Abused:   . Sexually Abused:     Family History  Problem Relation Age of Onset  . Heart attack Mother   . Cancer Mother   . Cancer Father     The following portions of the patient's history were reviewed and updated as appropriate: allergies, current medications, past family history, past medical history, past social history, past surgical history and problem list.  Review of Systems  ROS negative except as noted above. Information obtained from patient.   Objective:   BP 137/90   Pulse 81   Ht 5\' 3"  (1.6 m)   Wt 241 lb 1.6 oz (109.4 kg)   LMP  (LMP Unknown)   BMI 42.71 kg/m  CONSTITUTIONAL: Well-developed, well-nourished female in no acute distress.   PHYSICAL EXAM: Declined by patient.    Assessment:   1. Abnormal uterine bleeding  - CBC - Estradiol - FSH/LH - Thyroid Panel With TSH - US PELVIS TRANSVAGINAL NON-OB (TV ONLY); Future - US PELVIS (TRANSABDOMINAL ONLY); Future     Plan:   Labs today, see orders.   Will return for transvaginal/abdominal ultrasound and agrees to exam afterwards if results merit.   Reviewed red flag symptoms and when to call.   RTC x 1-2 weeks for ultrasound and results review or sooner if needed.    Dani Gobble, CNM [facility], [facility] 04/12/20 3:27 PM    I spent 20 minutes dedicated to the care of this patient on the date of this encounter to include pre-visit review of records, face  to face time with the patient and post visit ordering of testing.

## 2020-04-13 LAB — CBC
Hematocrit: 39.6 % (ref 34.0–46.6)
Hemoglobin: 13 g/dL (ref 11.1–15.9)
MCH: 28.4 pg (ref 26.6–33.0)
MCHC: 32.8 g/dL (ref 31.5–35.7)
MCV: 87 fL (ref 79–97)
Platelets: 246 10*3/uL (ref 150–450)
RBC: 4.58 x10E6/uL (ref 3.77–5.28)
RDW: 12.9 % (ref 11.7–15.4)
WBC: 7.5 10*3/uL (ref 3.4–10.8)

## 2020-04-13 LAB — THYROID PANEL WITH TSH
Free Thyroxine Index: 1.9 (ref 1.2–4.9)
T3 Uptake Ratio: 28 % (ref 24–39)
T4, Total: 6.9 ug/dL (ref 4.5–12.0)
TSH: 1.06 u[IU]/mL (ref 0.450–4.500)

## 2020-04-13 LAB — ESTRADIOL: Estradiol: 21.8 pg/mL

## 2020-04-13 LAB — FSH/LH
FSH: 36.1 m[IU]/mL
LH: 28.1 m[IU]/mL

## 2020-04-18 ENCOUNTER — Ambulatory Visit (INDEPENDENT_AMBULATORY_CARE_PROVIDER_SITE_OTHER): Payer: Medicaid Other | Admitting: Certified Nurse Midwife

## 2020-04-18 ENCOUNTER — Ambulatory Visit (INDEPENDENT_AMBULATORY_CARE_PROVIDER_SITE_OTHER): Payer: Medicaid Other

## 2020-04-18 ENCOUNTER — Other Ambulatory Visit: Payer: Self-pay

## 2020-04-18 ENCOUNTER — Encounter: Payer: Self-pay | Admitting: Certified Nurse Midwife

## 2020-04-18 ENCOUNTER — Other Ambulatory Visit (HOSPITAL_COMMUNITY)
Admission: RE | Admit: 2020-04-18 | Discharge: 2020-04-18 | Disposition: A | Discharge status: Home / Self Care | Payer: Medicaid Other | Source: Ambulatory Visit | Attending: Certified Nurse Midwife | Admitting: Certified Nurse Midwife

## 2020-04-18 VITALS — BP 139/66 | HR 71 | Ht 63.0 in | Wt 239.9 lb

## 2020-04-18 DIAGNOSIS — D219 Benign neoplasm of connective and other soft tissue, unspecified: Secondary | ICD-10-CM | POA: Insufficient documentation

## 2020-04-18 DIAGNOSIS — N939 Abnormal uterine and vaginal bleeding, unspecified: Secondary | ICD-10-CM

## 2020-04-18 DIAGNOSIS — R9389 Abnormal findings on diagnostic imaging of other specified body structures: Secondary | ICD-10-CM | POA: Diagnosis not present

## 2020-04-18 NOTE — Patient Instructions (Signed)
Ethinyl Estradiol; Norgestimate tablets What is this medicine? ETHINYL ESTRADIOL; NORGESTIMATE (ETH in il es tra DYE ole; nor JES ti mate) is an oral contraceptive. The products combine two types of female hormones, an estrogen and a progestin. They are used to prevent ovulation and pregnancy. Some products are also used to treat acne in females. This medicine may be used for other purposes; ask your health care provider or pharmacist if you have questions. COMMON BRAND NAME(S): Estarylla, Mili, MONO-LINYAH, MonoNessa, Norgestimate/Ethinyl Estradiol, Ortho Tri-Cyclen, Ortho Tri-Cyclen Lo, Ortho-Cyclen, Previfem, Sprintec, Tri-Estarylla, TRI-LINYAH, Tri-Lo-Estarylla, Tri-Lo-Marzia, Tri-Lo-Mili, Tri-Lo-Sprintec, Tri-Mili, Tri-Previfem, Tri-Sprintec, Tri-VyLibra, Trinessa, Trinessa Lo, VyLibra What should I tell my health care provider before I take this medicine? They need to know if you have or ever had any of these conditions:  abnormal vaginal bleeding  blood vessel disease or blood clots  breast, cervical, endometrial, ovarian, liver, or uterine cancer  diabetes  gallbladder disease  heart disease or recent heart attack  high blood pressure  high cholesterol  kidney disease  liver disease  migraine headaches  stroke  systemic lupus erythematosus (SLE)  tobacco smoker  an unusual or allergic reaction to estrogens, progestins, other medicines, foods, dyes, or preservatives  pregnant or trying to get pregnant  breast-feeding How should I use this medicine? Take this medicine by mouth. To reduce nausea, this medicine may be taken with food. Follow the directions on the prescription label. Take this medicine at the same time each day and in the order directed on the package. Do not take your medicine more often than directed. Contact your pediatrician regarding the use of this medicine in children. Special care may be needed. This medicine has been used in female children who  have started having menstrual periods. A patient package insert for the product will be given with each prescription and refill. Read this sheet carefully each time. The sheet may change frequently. Overdosage: If you think you have taken too much of this medicine contact a poison control center or emergency room at once. NOTE: This medicine is only for you. Do not share this medicine with others. What if I miss a dose? If you miss a dose, refer to the patient information sheet you received with your medicine for direction. If you miss more than one pill, this medicine may not be as effective and you may need to use another form of birth control. What may interact with this medicine? Do not take this medicine with the following medication:  dasabuvir; ombitasvir; paritaprevir; ritonavir  ombitasvir; paritaprevir; ritonavir This medicine may also interact with the following medications:  acetaminophen  antibiotics or medicines for infections, especially rifampin, rifabutin, rifapentine, and griseofulvin, and possibly penicillins or tetracyclines  aprepitant  ascorbic acid (vitamin C)  atorvastatin  barbiturate medicines, such as phenobarbital  bosentan  carbamazepine  caffeine  clofibrate  cyclosporine  dantrolene  doxercalciferol  felbamate  grapefruit juice  hydrocortisone  medicines for anxiety or sleeping problems, such as diazepam or temazepam  medicines for diabetes, including pioglitazone  mineral oil  modafinil  mycophenolate  nefazodone  oxcarbazepine  phenytoin  prednisolone  ritonavir or other medicines for HIV infection or AIDS  rosuvastatin  selegiline  soy isoflavones supplements  St. John's wort  tamoxifen or raloxifene  theophylline  thyroid hormones  topiramate  warfarin This list may not describe all possible interactions. Give your health care provider a list of all the medicines, herbs, non-prescription drugs, or  dietary supplements you use. Also tell them if  you smoke, drink alcohol, or use illegal drugs. Some items may interact with your medicine. What should I watch for while using this medicine? Visit your doctor or health care professional for regular checks on your progress. You will need a regular breast and pelvic exam and Pap smear while on this medicine. You should also discuss the need for regular mammograms with your health care professional, and follow his or her guidelines for these tests. This medicine can make your body retain fluid, making your fingers, hands, or ankles swell. Your blood pressure can go up. Contact your doctor or health care professional if you feel you are retaining fluid. Use an additional method of contraception during the first cycle that you take these tablets. If you have any reason to think you are pregnant, stop taking this medicine right away and contact your doctor or health care professional. If you are taking this medicine for hormone related problems, it may take several cycles of use to see improvement in your condition. Do not use this product if you smoke and are over 35 years of age. Smoking increases the risk of getting a blood clot or having a stroke while you are taking birth control pills, especially if you are more than 57 years old. If you are a smoker who is 35 years of age or younger, you are strongly advised not to smoke while taking birth control pills. This medicine can make you more sensitive to the sun. Keep out of the sun. If you cannot avoid being in the sun, wear protective clothing and use sunscreen. Do not use sun lamps or tanning beds/booths. If you wear contact lenses and notice visual changes, or if the lenses begin to feel uncomfortable, consult your eye care specialist. In some women, tenderness, swelling, or minor bleeding of the gums may occur. Notify your dentist if this happens. Brushing and flossing your teeth regularly may help limit  this. See your dentist regularly and inform your dentist of the medicines you are taking. If you are going to have elective surgery, you may need to stop taking this medicine before the surgery. Consult your health care professional for advice. This medicine does not protect you against HIV infection (AIDS) or any other sexually transmitted diseases. What side effects may I notice from receiving this medicine? Side effects that you should report to your doctor or health care professional as soon as possible:  breast tissue changes or discharge  changes in vaginal bleeding during your period or between your periods  chest pain  coughing up blood  dizziness or fainting spells  headaches or migraines  leg, arm or groin pain  severe or sudden headaches  stomach pain (severe)  sudden shortness of breath  sudden loss of coordination, especially on one side of the body  speech problems  symptoms of vaginal infection like itching, irritation or unusual discharge  tenderness in the upper abdomen  vomiting  weakness or numbness in the arms or legs, especially on one side of the body  yellowing of the eyes or skin Side effects that usually do not require medical attention (report to your doctor or health care professional if they continue or are bothersome):  breakthrough bleeding and spotting that continues beyond the 3 initial cycles of pills  breast tenderness  mood changes, anxiety, depression, frustration, anger, or emotional outbursts  increased sensitivity to sun or ultraviolet light  nausea  skin rash, acne, or brown spots on the skin  weight gain (slight) This   list may not describe all possible side effects. Call your doctor for medical advice about side effects. You may report side effects to FDA at 1-800-FDA-1088. Where should I keep my medicine? Keep out of the reach of children. Store at room temperature between 15 and 30 degrees C (59 and 86 degrees F).  Throw away any unused medicine after the expiration date. NOTE: This sheet is a summary. It may not cover all possible information. If you have questions about this medicine, talk to your doctor, pharmacist, or health care provider.  2020 Elsevier/Gold Standard (2016-05-04 08:09:09)   Levonorgestrel intrauterine device (IUD) What is this medicine? LEVONORGESTREL IUD (LEE voe nor jes trel) is a contraceptive (birth control) device. The device is placed inside the uterus by a healthcare professional. It is used to prevent pregnancy. This device can also be used to treat heavy bleeding that occurs during your period. This medicine may be used for other purposes; ask your health care provider or pharmacist if you have questions. COMMON BRAND NAME(S): Minette Headland What should I tell my health care provider before I take this medicine? They need to know if you have any of these conditions:  abnormal Pap smear  cancer of the breast, uterus, or cervix  diabetes  endometritis  genital or pelvic infection now or in the past  have more than one sexual partner or your partner has more than one partner  heart disease  history of an ectopic or tubal pregnancy  immune system problems  IUD in place  liver disease or tumor  problems with blood clots or take blood-thinners  seizures  use intravenous drugs  uterus of unusual shape  vaginal bleeding that has not been explained  an unusual or allergic reaction to levonorgestrel, other hormones, silicone, or polyethylene, medicines, foods, dyes, or preservatives  pregnant or trying to get pregnant  breast-feeding How should I use this medicine? This device is placed inside the uterus by a health care professional. Talk to your pediatrician regarding the use of this medicine in children. Special care may be needed. Overdosage: If you think you have taken too much of this medicine contact a poison control center or  emergency room at once. NOTE: This medicine is only for you. Do not share this medicine with others. What if I miss a dose? This does not apply. Depending on the brand of device you have inserted, the device will need to be replaced every 3 to 6 years if you wish to continue using this type of birth control. What may interact with this medicine? Do not take this medicine with any of the following medications:  amprenavir  bosentan  fosamprenavir This medicine may also interact with the following medications:  aprepitant  armodafinil  barbiturate medicines for inducing sleep or treating seizures  bexarotene  boceprevir  griseofulvin  medicines to treat seizures like carbamazepine, ethotoin, felbamate, oxcarbazepine, phenytoin, topiramate  modafinil  pioglitazone  rifabutin  rifampin  rifapentine  some medicines to treat HIV infection like atazanavir, efavirenz, indinavir, lopinavir, nelfinavir, tipranavir, ritonavir  St. John's wort  warfarin This list may not describe all possible interactions. Give your health care provider a list of all the medicines, herbs, non-prescription drugs, or dietary supplements you use. Also tell them if you smoke, drink alcohol, or use illegal drugs. Some items may interact with your medicine. What should I watch for while using this medicine? Visit your doctor or health care professional for regular check ups. See your doctor  if you or your partner has sexual contact with others, becomes HIV positive, or gets a sexual transmitted disease. This product does not protect you against HIV infection (AIDS) or other sexually transmitted diseases. You can check the placement of the IUD yourself by reaching up to the top of your vagina with clean fingers to feel the threads. Do not pull on the threads. It is a good habit to check placement after each menstrual period. Call your doctor right away if you feel more of the IUD than just the threads or  if you cannot feel the threads at all. The IUD may come out by itself. You may become pregnant if the device comes out. If you notice that the IUD has come out use a backup birth control method like condoms and call your health care provider. Using tampons will not change the position of the IUD and are okay to use during your period. This IUD can be safely scanned with magnetic resonance imaging (MRI) only under specific conditions. Before you have an MRI, tell your healthcare provider that you have an IUD in place, and which type of IUD you have in place. What side effects may I notice from receiving this medicine? Side effects that you should report to your doctor or health care professional as soon as possible:  allergic reactions like skin rash, itching or hives, swelling of the face, lips, or tongue  fever, flu-like symptoms  genital sores  high blood pressure  no menstrual period for 6 weeks during use  pain, swelling, warmth in the leg  pelvic pain or tenderness  severe or sudden headache  signs of pregnancy  stomach cramping  sudden shortness of breath  trouble with balance, talking, or walking  unusual vaginal bleeding, discharge  yellowing of the eyes or skin Side effects that usually do not require medical attention (report to your doctor or health care professional if they continue or are bothersome):  acne  breast pain  change in sex drive or performance  changes in weight  cramping, dizziness, or faintness while the device is being inserted  headache  irregular menstrual bleeding within first 3 to 6 months of use  nausea This list may not describe all possible side effects. Call your doctor for medical advice about side effects. You may report side effects to FDA at 1-800-FDA-1088. Where should I keep my medicine? This does not apply. NOTE: This sheet is a summary. It may not cover all possible information. If you have questions about this  medicine, talk to your doctor, pharmacist, or health care provider.  2020 Elsevier/Gold Standard (2018-07-05 13:22:01)   Leuprolide depot injection What is this medicine? LEUPROLIDE (loo PROE lide) is a man-made protein that acts like a natural hormone in the body. It decreases testosterone in men and decreases estrogen in women. In men, this medicine is used to treat advanced prostate cancer. In women, some forms of this medicine may be used to treat endometriosis, uterine fibroids, or other female hormone-related problems. This medicine may be used for other purposes; ask your health care provider or pharmacist if you have questions. COMMON BRAND NAME(S): Eligard, Fensolv, Lupron Depot, Lupron Depot-Ped, Viadur What should I tell my health care provider before I take this medicine? They need to know if you have any of these conditions:  diabetes  heart disease or previous heart attack  high blood pressure  high cholesterol  mental illness  osteoporosis  pain or difficulty passing urine  seizures  spinal cord metastasis  stroke  suicidal thoughts, plans, or attempt; a previous suicide attempt by you or a family member  tobacco smoker  unusual vaginal bleeding (women)  an unusual or allergic reaction to leuprolide, benzyl alcohol, other medicines, foods, dyes, or preservatives  pregnant or trying to get pregnant  breast-feeding How should I use this medicine? This medicine is for injection into a muscle or for injection under the skin. It is given by a health care professional in a hospital or clinic setting. The specific product will determine how it will be given to you. Make sure you understand which product you receive and how often you will receive it. Talk to your pediatrician regarding the use of this medicine in children. Special care may be needed. Overdosage: If you think you have taken too much of this medicine contact a poison control center or emergency  room at once. NOTE: This medicine is only for you. Do not share this medicine with others. What if I miss a dose? It is important not to miss a dose. Call your doctor or health care professional if you are unable to keep an appointment. Depot injections: Depot injections are given either once-monthly, every 12 weeks, every 16 weeks, or every 24 weeks depending on the product you are prescribed. The product you are prescribed will be based on if you are female or female, and your condition. Make sure you understand your product and dosing. What may interact with this medicine? Do not take this medicine with any of the following medications:  chasteberry This medicine may also interact with the following medications:  herbal or dietary supplements, like black cohosh or DHEA  female hormones, like estrogens or progestins and birth control pills, patches, rings, or injections  female hormones, like testosterone This list may not describe all possible interactions. Give your health care provider a list of all the medicines, herbs, non-prescription drugs, or dietary supplements you use. Also tell them if you smoke, drink alcohol, or use illegal drugs. Some items may interact with your medicine. What should I watch for while using this medicine? Visit your doctor or health care professional for regular checks on your progress. During the first weeks of treatment, your symptoms may get worse, but then will improve as you continue your treatment. You may get hot flashes, increased bone pain, increased difficulty passing urine, or an aggravation of nerve symptoms. Discuss these effects with your doctor or health care professional, some of them may improve with continued use of this medicine. Female patients may experience a menstrual cycle or spotting during the first months of therapy with this medicine. If this continues, contact your doctor or health care professional. This medicine may increase blood  sugar. Ask your healthcare provider if changes in diet or medicines are needed if you have diabetes. What side effects may I notice from receiving this medicine? Side effects that you should report to your doctor or health care professional as soon as possible:  allergic reactions like skin rash, itching or hives, swelling of the face, lips, or tongue  breathing problems  chest pain  depression or memory disorders  pain in your legs or groin  pain at site where injected or implanted  seizures  severe headache  signs and symptoms of high blood sugar such as being more thirsty or hungry or having to urinate more than normal. You may also feel very tired or have blurry vision  swelling of the feet and legs  suicidal  thoughts or other mood changes  visual changes  vomiting Side effects that usually do not require medical attention (report to your doctor or health care professional if they continue or are bothersome):  breast swelling or tenderness  decrease in sex drive or performance  diarrhea  hot flashes  loss of appetite  muscle, joint, or bone pains  nausea  redness or irritation at site where injected or implanted  skin problems or acne This list may not describe all possible side effects. Call your doctor for medical advice about side effects. You may report side effects to FDA at 1-800-FDA-1088. Where should I keep my medicine? This drug is given in a hospital or clinic and will not be stored at home. NOTE: This sheet is a summary. It may not cover all possible information. If you have questions about this medicine, talk to your doctor, pharmacist, or health care provider.  2020 Elsevier/Gold Standard (2018-06-23 09:27:03)   Uterine Fibroids  Uterine fibroids are lumps of tissue (tumors) in your womb (uterus). They are not cancer (are benign). Most women with this condition do not need treatment. Sometimes fibroids can affect your ability to have  children (your fertility). If that happens, you may need surgery to take out the fibroids. Follow these instructions at home:  Take over-the-counter and prescription medicines only as told by your doctor. Your doctor may suggest NSAIDs (such as aspirin or ibuprofen) to help with pain.  Ask your doctor if you should: ? Take iron pills. ? Eat more foods that have iron in them, such as dark green, leafy vegetables.  If directed, apply heat to your back or belly to reduce pain. Use the heat source that your doctor recommends, such as a moist heat pack or a heating pad. ? Put a towel between your skin and the heat source. ? Leave the heat on for 20-30 minutes. ? Remove the heat if your skin turns bright red. This is especially important if you are unable to feel pain, heat, or cold. You may have a greater risk of getting burned.  Pay close attention to your period (menstrual) cycles. Tell your doctor about any changes, such as: ? A heavier blood flow than usual. ? Needing to use more pads or tampons than normal. ? A change in how many days your period lasts. ? A change in symptoms that come with your period, such as cramps or back pain.  Keep all follow-up visits as told by your doctor. This is important. Your doctor may need to watch your fibroids over time for any changes. Contact a doctor if you:  Have pain that does not get better with medicine or heat, such as pain or cramps in: ? Your back. ? The area between your hip bones (pelvic area). ? Your belly.  Have new bleeding between your periods.  Have more bleeding during or between your periods.  Feel very tired or weak.  Feel light-headed. Get help right away if you:  Pass out (faint).  Have pain in the area between your hip bones that suddenly gets worse.  Have bleeding that soaks a tampon or pad in 30 minutes or less. Summary  Uterine fibroids are lumps of tissue (tumors) in your womb (uterus). They are not  cancer.  The only treatment that most women need is taking aspirin or ibuprofen for pain.  Contact a doctor if you have pain or cramps that do not get better with medicine.  Make sure you know what symptoms  you should get help for right away. This information is not intended to replace advice given to you by your health care provider. Make sure you discuss any questions you have with your health care provider. Document Revised: 08/06/2017 Document Reviewed: 07/20/2017 Elsevier Patient Education  [employer].   Endometrial Biopsy, Care After This sheet gives you information about how to care for yourself after your procedure. Your health care provider may also give you more specific instructions. If you have problems or questions, contact your health care provider. What can I expect after the procedure? After the procedure, it is common to have:  Mild cramping.  A small amount of vaginal bleeding for a few days. This is normal. Follow these instructions at home:   Take over-the-counter and prescription medicines only as told by your health care provider.  Do not douche, use tampons, or have sexual intercourse until your health care provider approves.  Return to your normal activities as told by your health care provider. Ask your health care provider what activities are safe for you.  Follow instructions from your health care provider about any activity restrictions, such as restrictions on strenuous exercise or heavy lifting. Contact a health care provider if:  You have heavy bleeding, or bleed for longer than 2 days after the procedure.  You have bad smelling discharge from your vagina.  You have a fever or chills.  You have a burning sensation when urinating or you have difficulty urinating.  You have severe pain in your lower abdomen. Get help right away if:  You have severe cramps in your stomach or back.  You pass large blood clots.  Your bleeding  increases.  You become weak or light-headed, or you pass out. Summary  After the procedure, it is common to have mild cramping and a small amount of vaginal bleeding for a few days.  Do not douche, use tampons, or have sexual intercourse until your health care provider approves.  Return to your normal activities as told by your health care provider. Ask your health care provider what activities are safe for you. This information is not intended to replace advice given to you by your health care provider. Make sure you discuss any questions you have with your health care provider. Document Revised: 08/06/2017 Document Reviewed: 09/09/2016 Elsevier Patient Education  [place].

## 2020-04-18 NOTE — Progress Notes (Signed)
GYN ENCOUNTER NOTE  Subjective:       Allison Dixon is a 59 y.o. G0P0 female here for review of lab results and ultrasound findings. Patient accompanied by her sister, Lendell Caprice.   Seen in office on 04/12/2020 for evaluation of abnormal uterine bleeding; for further details, please see note.   Denies difficulty breathing or respiratory distress, chest pain, abdominal pain, dysuria, and leg pain or selling.   Gynecologic History  No LMP recorded (lmp unknown).  Contraception: abstinence  Last Pap: unknown  Last mammogram: 06/2014. Results were: normal  Obstetric History  OB History  Gravida Para Term Preterm AB Living  0 0 0 0 0 0  SAB TAB Ectopic Multiple Live Births  0 0 0 0 0    Past Medical History:  Diagnosis Date  . Hypertension     Past Surgical History:  Procedure Laterality Date  . DILATION AND CURETTAGE, DIAGNOSTIC / THERAPEUTIC      Current Outpatient Medications on File Prior to Visit  Medication Sig Dispense Refill  . ibuprofen (ADVIL) 200 MG tablet Take 400 mg by mouth every 6 (six) hours as needed for pain.     No current facility-administered medications on file prior to visit.    No Known Allergies  Social History   Socioeconomic History  . Marital status: Married    Spouse name: Not on file  . Number of children: Not on file  . Years of education: Not on file  . Highest education level: Not on file  Occupational History  . Not on file  Tobacco Use  . Smoking status: Never Smoker  . Smokeless tobacco: Never Used  Vaping Use  . Vaping Use: Never used  Substance and Sexual Activity  . Alcohol use: Never  . Drug use: Never  . Sexual activity: Not Currently  Other Topics Concern  . Not on file  Social History Narrative  . Not on file   Social Determinants of Health   Financial Resource Strain:   . Difficulty of Paying Living Expenses:   Food Insecurity:   . Worried About Charity fundraiser in the Last Year:   . Arboriculturist  in the Last Year:   Transportation Needs:   . Film/video editor (Medical):   Marland Kitchen Lack of Transportation (Non-Medical):   Physical Activity:   . Days of Exercise per Week:   . Minutes of Exercise per Session:   Stress:   . Feeling of Stress :   Social Connections:   . Frequency of Communication with Friends and Family:   . Frequency of Social Gatherings with Friends and Family:   . Attends Religious Services:   . Active Member of Clubs or Organizations:   . Attends Archivist Meetings:   Marland Kitchen Marital Status:   Intimate Partner Violence:   . Fear of Current or Ex-Partner:   . Emotionally Abused:   Marland Kitchen Physically Abused:   . Sexually Abused:     Family History  Problem Relation Age of Onset  . Heart attack Mother   . Cancer Mother   . Cancer Father     The following portions of the patient's history were reviewed and updated as appropriate: allergies, current medications, past family history, past medical history, past social history, past surgical history and problem list.  Review of Systems  ROS negative except as noted above. Information obtained from patient.   Objective:   BP 139/66   Pulse 71  Ht 5\' 3"  (1.6 m)   Wt 239 lb 14.4 oz (108.8 kg)   LMP  (LMP Unknown)   BMI 42.50 kg/m   CONSTITUTIONAL: Well-developed, well-nourished female in no acute distress.   PHYSICAL EXAM: Not indicated.   Recent Results (from the past 2160 hour(s))  SARS CORONAVIRUS 2 (TAT 6-24 HRS) Nasopharyngeal Nasopharyngeal Swab     Status: None   Collection Time: 02/19/20 12:51 PM   Specimen: Nasopharyngeal Swab  Result Value Ref Range   SARS Coronavirus 2 NEGATIVE NEGATIVE    Comment: (NOTE) SARS-CoV-2 target nucleic acids are NOT DETECTED.  The SARS-CoV-2 RNA is generally detectable in upper and lower respiratory specimens during the acute phase of infection. Negative results do not preclude SARS-CoV-2 infection, do not rule out co-infections with other pathogens, and  should not be used as the sole basis for treatment or other patient management decisions. Negative results must be combined with clinical observations, patient history, and epidemiological information. The expected result is Negative.  Fact Sheet for Patients: SugarRoll.be  Fact Sheet for Healthcare Providers: https://www.woods-mathews.com/  This test is not yet approved or cleared by the [place] FDA and  has been authorized for detection and/or diagnosis of SARS-CoV-2 by FDA under an Emergency Use Authorization (EUA). This EUA will remain  in effect (meaning this test can be used) for the duration of the COVID-19 declaration under Se ction 564(b)(1) of the Act, 21 U.S.C. section 360bbb-3(b)(1), unless the authorization is terminated or revoked sooner.  Performed at [facility], [place] [address], [place], [place] [address]   SARS CORONAVIRUS 2 (TAT 6-24 HRS) Nasopharyngeal Nasopharyngeal Swab     Status: None   Collection Time: 03/01/20 10:42 AM   Specimen: Nasopharyngeal Swab  Result Value Ref Range   SARS Coronavirus 2 NEGATIVE NEGATIVE    Comment: (NOTE) SARS-CoV-2 target nucleic acids are NOT DETECTED.  The SARS-CoV-2 RNA is generally detectable in upper and lower respiratory specimens during the acute phase of infection. Negative results do not preclude SARS-CoV-2 infection, do not rule out co-infections with other pathogens, and should not be used as the sole basis for treatment or other patient management decisions. Negative results must be combined with clinical observations, patient history, and epidemiological information. The expected result is Negative.  Fact Sheet for Patients: SugarRoll.be  Fact Sheet for Healthcare Providers: https://www.woods-mathews.com/  This test is not yet approved or cleared by the [place] FDA and  has been authorized for detection  and/or diagnosis of SARS-CoV-2 by FDA under an Emergency Use Authorization (EUA). This EUA will remain  in effect (meaning this test can be used) for the duration of the COVID-19 declaration under Se ction 564(b)(1) of the Act, 21 U.S.C. section 360bbb-3(b)(1), unless the authorization is terminated or revoked sooner.  Performed at [facility], [place] [address], [place] [address]   CBC     Status: None   Collection Time: 04/12/20  2:26 PM  Result Value Ref Range   WBC 7.5 3.4 - 10.8 x10E3/uL   RBC 4.58 3.77 - 5.28 x10E6/uL   Hemoglobin 13.0 11.1 - 15.9 g/dL   Hematocrit 39.6 34.0 - 46.6 %   MCV 87 79 - 97 fL   MCH 28.4 26.6 - 33.0 pg   MCHC 32.8 31 - 35 g/dL   RDW 12.9 11.7 - 15.4 %   Platelets 246 150 - 450 x10E3/uL  Estradiol     Status: None   Collection Time: 04/12/20  2:26  PM  Result Value Ref Range   Estradiol 21.8 pg/mL    Comment:                     Adult Female:                       Follicular phase   65.7 -   166.0                       Ovulation phase    85.8 -   498.0                       Luteal phase       43.8 -   211.0                       Postmenopausal     <6.0 -    54.7                     Pregnancy                       1st trimester     215.0 - >4300.0 Roche ECLIA methodology   FSH/LH     Status: None   Collection Time: 04/12/20  2:26 PM  Result Value Ref Range   LH 28.1 mIU/mL    Comment:                     Adult Female:                       Follicular phase      2.4 -  12.6                       Ovulation phase      14.0 -  95.6                       Luteal phase          1.0 -  11.4                       Postmenopausal        7.7 -  58.5    FSH 36.1 mIU/mL    Comment:                     Adult Female:                       Follicular phase      3.5 -  12.5                       Ovulation phase       4.7 -  21.5                       Luteal phase          1.7 -   7.7                       Postmenopausal       25.8 - 134.8    Thyroid Panel With TSH     Status: None   Collection Time: 04/12/20  2:26 PM  Result Value Ref Range   TSH 1.060 0.450 - 4.500 uIU/mL   T4, Total 6.9 4.5 - 12.0 ug/dL   T3 Uptake Ratio 28 24 - 39 %   Free Thyroxine Index 1.9 1.2 - 4.9   ULTRASOUND REPORT  Location: [facility]  Date of Service: 04/18/2020   Indications:AUB   Findings:  The uterus is anteverted and measures 10.2 x 5.4. Echo texture is heterogenous with evidence of focal masses. Within the uterus are multiple suspected fibroids measuring: Fibroid 1:Posterior IM 2.6 x 3.1 x 3.0 cm Fibroid 2:Anterior IM 2.2 x 2.2 x 1.8 cm  The Endometrium measures 14 mm.  Right Ovary was not seen Left Ovary was not seen Survey of the adnexa demonstrates no adnexal masses. There is no free fluid in the cul de sac.  Impression: 1. Thick endometrium as described above. 2. Fibroid uterus 3. Ovaries were not seen due to bowel gas and body habitus.  Recommendations: 1.Clinical correlation with the patient's History and Physical Exam.    Assessment:   1. Fibroids   2. Endometrial thickening on ultrasound   3. Abnormal uterine bleeding    Plan:   Ultrasound and labs findings discussed with patient and sister, verbalized understanding.   Agrees to endometrial biopsy today, see note below.   Education regarding fibroid management options, handouts provided. See AVS.   Reviewed red flag symptoms and when to call.   RTC x 1-2 weeks for plan of care discussion or sooner if needed.    Dani Gobble, CNM [facility], [facility] 04/18/20 4:59 PM    Endometrial Biopsy Note:  PRE-OP DIAGNOSIS: Abnormal Uterine Bleeding, Fibroid Uterus, Thickened Endometrial Lining on Ultrasound  POST-OP DIAGNOSIS: Same   PROCEDURE: Endometrial Biopsy  Performing Provider: Dani Gobble, CNM   PROCEDURE:   A timeout protocol was performed prior to initiating the procedure   A small plastic speculum was  inserted into the patient's vagina, and the cervix was visualized.   The cervix was cleansed with antiseptic solution. Sterile gloves were donned.   Hurricane spray was used prior to the placement of the tenaculum.   The device was inserted through the cervical canal, into the uterine cavity, and up to the fundus. The depth of the uterus was determined with the instrument, 10 cm. The instrument was withdrawn as it was rotated 3-4 times to obtain the sample which was sent in formalin to pathology    Followup: The patient tolerated the procedure well without complications.  Standard post-procedure care is explained and return precautions are given.   Dani Gobble, CNM  [facility], [facility] 04/18/20 5:14 PM

## 2020-04-23 ENCOUNTER — Telehealth: Payer: Self-pay | Admitting: Certified Nurse Midwife

## 2020-04-23 ENCOUNTER — Telehealth: Payer: Self-pay

## 2020-04-23 NOTE — Telephone Encounter (Signed)
Received a message from [facility] sent to [place] regarding this patient. A call was received from [facility] pathology. 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 Message sent to [facility] CNM.

## 2020-04-23 NOTE — Telephone Encounter (Signed)
[facility] called in stating that they were needing pathology reports on this patient and requested that we give them a call back at [phone].  Could you please advise?

## 2020-04-26 ENCOUNTER — Ambulatory Visit (INDEPENDENT_AMBULATORY_CARE_PROVIDER_SITE_OTHER): Payer: Medicaid Other | Admitting: Certified Nurse Midwife

## 2020-04-26 ENCOUNTER — Other Ambulatory Visit (HOSPITAL_COMMUNITY)
Admission: RE | Admit: 2020-04-26 | Discharge: 2020-04-26 | Disposition: A | Discharge status: Home / Self Care | Payer: Medicaid Other | Source: Ambulatory Visit | Attending: Certified Nurse Midwife | Admitting: Certified Nurse Midwife

## 2020-04-26 ENCOUNTER — Encounter: Payer: Self-pay | Admitting: Certified Nurse Midwife

## 2020-04-26 ENCOUNTER — Other Ambulatory Visit: Payer: Self-pay

## 2020-04-26 VITALS — BP 125/89 | Ht 63.0 in | Wt 241.5 lb

## 2020-04-26 DIAGNOSIS — C541 Malignant neoplasm of endometrium: Secondary | ICD-10-CM | POA: Insufficient documentation

## 2020-04-26 DIAGNOSIS — Z712 Person consulting for explanation of examination or test findings: Secondary | ICD-10-CM

## 2020-04-26 DIAGNOSIS — Z124 Encounter for screening for malignant neoplasm of cervix: Secondary | ICD-10-CM | POA: Diagnosis present

## 2020-04-26 NOTE — Patient Instructions (Signed)
Uterine Cancer  Uterine cancer is an abnormal growth of cancer tissue (malignant tumor) in the uterus. Unlike noncancerous (benign) tumors, malignant tumors can spread to other parts of the body. Uterine cancer usually occurs after menopause. However, it may also occur around the time that menopause begins. The wall of the uterus has an inner layer of tissue (endometrium) and an outer layer of muscle tissue (myometrium). The most common type of uterine cancer begins in the endometrium (endometrial cancer). Cancer that begins in the myometrium (uterine sarcoma) is very rare. What are the causes? The exact cause of this condition is not known. What increases the risk? You are more likely to develop this condition if you:  Are older than 50.  Have an enlarged endometrium (endometrial hyperplasia).  Use hormone therapy.  Are severely overweight (obese).  Use the medicine tamoxifen.  You are white (Caucasian).  Cannot bear children (are infertile).  Have never been pregnant.  Started menstruating at an age younger than 12 years.  Are older than 52 and are still having menstrual periods.  Have a history of cancer of the ovaries, intestines, or colon or rectum (colorectal cancer).  Have a history of enlarged ovaries with small cysts (polycystic ovarian syndrome).  Have a family history of: ? Uterine cancer. ? Hereditary nonpolyposis colon cancer (HNPCC).  Have diabetes, high blood pressure, thyroid disease, or gallbladder disease.  Use long-term, high-dose birth control pills.  Have been exposed to radiation.  Smoke. What are the signs or symptoms? Symptoms of this condition include:  Abnormal vaginal bleeding or discharge. Bleeding may start as a watery, blood-streaked flow that gradually contains more blood. This is the most common symptom. If you experience abnormal vaginal bleeding, do not assume that it is part of menopause.  Vaginal bleeding after  menopause.  Unexplained weight loss.  Bleeding between periods.  Urination that is difficult, painful, or more frequent than usual.  A lump (mass) in the vagina.  Pain, bloating, or fullness in the abdomen.  Pain in the pelvic area.  Pain during sex. How is this diagnosed? This condition may be diagnosed based on:  Your medical history and your symptoms.  A physical and pelvic exam. Your health care provider will feel your pelvis for any growths or enlarged lymph nodes.  Blood and urine tests.  Imaging tests, such as X-rays, CT scans, ultrasound, or MRIs.  A procedure in which a thin, flexible tube with a light and camera on the end is inserted through the vagina and used to look inside the uterus (hysteroscopy).  A Pap test to check for abnormal cells in the lower part of the uterus (cervix) and the upper vagina.  Removing a tissue sample (biopsy) from the uterine lining to check for cancer cells.  Dilation and curettage (D&C). This is a procedure that involves stretching (dilation) the cervix and scraping (curettage) the inside lining of the uterus to get a biopsy and check for cancer cells. Your cancer will be staged to determine its severity and extent. Staging is an assessment of:  The size of the tumor.  Whether the cancer has spread.  Where the cancer has spread. The stages of uterine cancer are as follows:  Stage I. The cancer is only found in the uterus.  Stage II. The cancer has spread to the cervix.  Stage III. The cancer has spread outside the uterus, but not outside the pelvis. The cancer may have spread to the lymph nodes in the pelvis. Lymph nodes are   part of your body's disease-fighting (immune) system. Lymph nodes are found in many locations in your body, including the neck, underarm, and groin.  Stage IV. The cancer has spread to other parts of the body, such as the bladder or rectum. How is this treated? This condition is often treated with surgery  to remove:  The uterus, cervix, fallopian tubes, and ovaries (total hysterectomy).  The uterus and cervix (simple hysterectomy). The type of hysterectomy you will have depends on the extent of your cancer. Lymph nodes near the uterus may also be removed in some cases. Treatment may also include one or more of the following:  Chemotherapy. This uses medicines to kill the cancer cells and prevent their spread.  Radiation therapy. This uses high-energy rays to kill the cancer cells and prevent the spread of cancer.  Chemoradiation. This is a combination treatment that alternates chemotherapy with radiation treatments to enhance the way radiation works.  Brachytherapy. This involves placing radioactive materials inside the body where the cancer was removed.  Hormone therapy. This includes taking medicines that lower the levels of estrogen in the body. Follow these instructions at home: Activity  Return to your normal activities as told by your health care provider. Ask your health care provider what activities are safe for you.  Exercise regularly as told by your health care provider.  Do not drive or use heavy machinery while taking prescription pain medicine. General instructions  Take over-the-counter and prescription medicines only as told by your health care provider.  Maintain a healthy diet.  Work with your health care provider to: ? Manage any long-term (chronic) conditions you have, such as diabetes, high blood pressure, thyroid disease, or gallbladder disease. ? Manage any side effects of your treatment.  Do not use any products that contain nicotine or tobacco, such as cigarettes and e-cigarettes. If you need help quitting, ask your health care provider.  Consider joining a support group to help you cope with stress. Your health care provider may be able to recommend a local or online support group.  Keep all follow-up visits as told by your health care provider. This is  important. Where to find more information  American Cancer Society: https://www.cancer.org  National Cancer Institute (NCI): https://www.cancer.gov Contact a health care provider if:  You have pain in your pelvis or abdomen that gets worse.  You cannot urinate.  You have abnormal bleeding.  You have a fever. Get help right away if:  You develop sudden or new severe symptoms, such as: ? Heavy bleeding. ? Severe weakness. ? Pain that is severe or does not get better with medicine. Summary  Uterine cancer is an abnormal growth of cancer tissue (malignant tumor) in the uterus. The most common type of uterine cancer begins in the endometrium (endometrial cancer).  This condition is often treated with surgery to remove the uterus, cervix, fallopian tubes, and ovaries (total hysterectomy) or the uterus and cervix (simple hysterectomy).  Work with your health care provider to manage any long-term (chronic) conditions you have, such as diabetes, high blood pressure, thyroid disease, or gallbladder disease.  Consider joining a support group to help you cope with stress. Your health care provider may be able to recommend a local or online support group. This information is not intended to replace advice given to you by your health care provider. Make sure you discuss any questions you have with your health care provider. Document Revised: 08/06/2017 Document Reviewed: 08/21/2016 Elsevier Patient Education  2020 Elsevier Inc.  

## 2020-04-26 NOTE — Progress Notes (Signed)
GYN ENCOUNTER NOTE  Subjective:       Allison Dixon is a 59 y.o. G0P0000 female here for results review.   Patient last seen on 04/18/2020; for further details, see previous note.   Denies difficulty breathing or respiratory distress, chest pain, abdominal pain, dysuria, and leg pain or swelling.    Gynecologic History  No LMP recorded (lmp unknown).  Contraception: post menopausal status  Last Pap: unknown.   Last mammogram: 06/2014. Results were: normal  Obstetric History  OB History  Gravida Para Term Preterm AB Living  0 0 0 0 0 0  SAB TAB Ectopic Multiple Live Births  0 0 0 0 0    Past Medical History:  Diagnosis Date  . Hypertension     Past Surgical History:  Procedure Laterality Date  . DILATION AND CURETTAGE, DIAGNOSTIC / THERAPEUTIC      Current Outpatient Medications on File Prior to Visit  Medication Sig Dispense Refill  . ibuprofen (ADVIL) 200 MG tablet Take 400 mg by mouth every 6 (six) hours as needed for pain.     No current facility-administered medications on file prior to visit.    No Known Allergies  Social History   Socioeconomic History  . Marital status: Married    Spouse name: Not on file  . Number of children: Not on file  . Years of education: Not on file  . Highest education level: Not on file  Occupational History  . Not on file  Tobacco Use  . Smoking status: Never Smoker  . Smokeless tobacco: Never Used  Vaping Use  . Vaping Use: Never used  Substance and Sexual Activity  . Alcohol use: Never  . Drug use: Never  . Sexual activity: Not Currently  Other Topics Concern  . Not on file  Social History Narrative  . Not on file   Social Determinants of Health   Financial Resource Strain:   . Difficulty of Paying Living Expenses: Not on file  Food Insecurity:   . Worried About Charity fundraiser in the Last Year: Not on file  . Ran Out of Food in the Last Year: Not on file  Transportation Needs:   . Lack of  Transportation (Medical): Not on file  . Lack of Transportation (Non-Medical): Not on file  Physical Activity:   . Days of Exercise per Week: Not on file  . Minutes of Exercise per Session: Not on file  Stress:   . Feeling of Stress : Not on file  Social Connections:   . Frequency of Communication with Friends and Family: Not on file  . Frequency of Social Gatherings with Friends and Family: Not on file  . Attends Religious Services: Not on file  . Active Member of Clubs or Organizations: Not on file  . Attends Archivist Meetings: Not on file  . Marital Status: Not on file  Intimate Partner Violence:   . Fear of Current or Ex-Partner: Not on file  . Emotionally Abused: Not on file  . Physically Abused: Not on file  . Sexually Abused: Not on file    Family History  Problem Relation Age of Onset  . Heart attack Mother   . Cancer Mother   . Cancer Father     The following portions of the patient's history were reviewed and updated as appropriate: allergies, current medications, past family history, past medical history, past social history, past surgical history and problem list.  Review of Systems  ROS  negative except as noted above. Information obtained from patient.   Objective:   BP 125/89   Ht 5\' 3"  (1.6 m)   Wt 241 lb 8 oz (109.5 kg)   LMP  (LMP Unknown)   BMI 42.78 kg/m    CONSTITUTIONAL: Well-developed, well-nourished female in no acute distress; tearful.   PELVIC:  External Genitalia: Normal  Vagina: Normal  Cervix: Normal, Pap collected   MUSCULOSKELETAL: Normal range of motion. No tenderness.  No cyanosis, clubbing, or edema.   Assessment:   1. Endometrial cancer ([place])  - Cytology - PAP - Ambulatory referral to Oncology  2. Screening for cervical cancer  - Cytology - PAP  3. Encounter to discuss test results   Plan:   Results discussed with patient. Patient called sister to office to provided emotional support, sister arrived  after 10 minutes.  Information packet provided.   Pap collected, see orders.   Referral to GYN Oncology, see chart. Patient advised to contact office if not contact by next week.   Reviewed red flag symptoms and when to call.   RTC as needed.    Dani Gobble, CNM [facility], [facility] 04/26/20 5:21 PM   I spent 30 dedicated to the care of this patient on the date of this encounter to include pre-visit review of records, face to face time with the patient and post visit ordering.

## 2020-04-30 ENCOUNTER — Telehealth: Payer: Self-pay | Admitting: Pulmonary Disease

## 2020-04-30 DIAGNOSIS — G4733 Obstructive sleep apnea (adult) (pediatric): Secondary | ICD-10-CM

## 2020-04-30 NOTE — Telephone Encounter (Signed)
Patient has been scheduled for OV on 05/07/2020 at 4:30 with Derl Barrow, NP.  Patient is aware and voiced her understanding.  Nothing further is needed at this time.

## 2020-04-30 NOTE — Telephone Encounter (Signed)
Please schedule ROV with me or NP to review sleep study results.

## 2020-05-01 LAB — CYTOLOGY - PAP
Comment: NEGATIVE
Diagnosis: NEGATIVE
High risk HPV: NEGATIVE

## 2020-05-02 ENCOUNTER — Telehealth: Payer: Self-pay

## 2020-05-02 NOTE — Telephone Encounter (Signed)
Referral received from [place] at [facility] for endomatrial cancer. Records reviewed. Called and spoke with Allison Dixon and she has been scheduled for 05/15/20 at 62 with gyn oncology. Directions to the cancer center given. Went over what to expect during the consult.

## 2020-05-07 ENCOUNTER — Encounter: Payer: Self-pay | Admitting: Primary Care

## 2020-05-07 ENCOUNTER — Other Ambulatory Visit: Payer: Self-pay

## 2020-05-07 ENCOUNTER — Ambulatory Visit (INDEPENDENT_AMBULATORY_CARE_PROVIDER_SITE_OTHER): Payer: Medicaid Other | Admitting: Primary Care

## 2020-05-07 VITALS — BP 120/84 | HR 80 | Temp 97.8°F | Ht 63.0 in | Wt 239.0 lb

## 2020-05-07 DIAGNOSIS — R0600 Dyspnea, unspecified: Secondary | ICD-10-CM

## 2020-05-07 DIAGNOSIS — R0602 Shortness of breath: Secondary | ICD-10-CM | POA: Insufficient documentation

## 2020-05-07 DIAGNOSIS — G4733 Obstructive sleep apnea (adult) (pediatric): Secondary | ICD-10-CM

## 2020-05-07 DIAGNOSIS — R0609 Other forms of dyspnea: Secondary | ICD-10-CM

## 2020-05-07 NOTE — Progress Notes (Signed)
@Patient  ID: Allison Dixon, female    DOB: 12-01-1960, 59 y.o.   MRN: [id number]  Chief Complaint  Patient presents with  . Follow-up    Review results of sleep study.      Referring provider: No ref. provider found  HPI: 59 year old female, never smoked.  Past medical history significant for hypertension, dyspnea, obstructive sleep apnea. Patient of Dr. Halford Chessman, seen for initial consult on 01/25/2020.  She is on no current medication.  05/07/2020 Patient presents today to review sleep study results.  She originally saw Dr. Halford Chessman back in May 2021 with reports of snoring and daytime somnolence.  Patient completed split-night sleep study on 04/11/2020 at [facility] which showed moderate obstructive sleep apnea with AHI 16.3/hour and SPO2 low 75%.  Recommended CPAP therapy with pressure setting of 10 cm H2O.  We discussed sleep study results today and treatment options.  Agreeing to initiate CPAP therapy.  Continue to encourage weight loss and avoid driving if experiencing excessive somnolence.  Patient continues to experience some exertional dyspnea.  She had a normal chest x-ray in May.  Family asking for temporary handicap placard as walking long distance causes her to get out of breath.  Patient had PFTs at [facility] that showed no obvious obstructive airway disease or restrictive lung disease.  She has never smoked.  No Known Allergies   There is no immunization history on file for this patient.  Past Medical History:  Diagnosis Date  . Hypertension     Tobacco History: Social History   Tobacco Use  Smoking Status Never Smoker  Smokeless Tobacco Never Used   Counseling given: Not Answered   Outpatient Medications Prior to Visit  Medication Sig Dispense Refill  . acetaminophen (TYLENOL) 500 MG tablet Take 500 mg by mouth every 6 (six) hours as needed.    Marland Kitchen ibuprofen (ADVIL) 200 MG tablet Take 400 mg by mouth every 6 (six) hours as needed for pain.     No facility-administered medications  prior to visit.   Review of Systems  Review of Systems  Constitutional: Positive for fatigue.  Respiratory: Positive for shortness of breath. Negative for cough, chest tightness and wheezing.   Psychiatric/Behavioral: Positive for sleep disturbance.    Physical Exam  BP 120/84 (BP Location: Left Arm, Cuff Size: Large)   Pulse 80   Temp 97.8 F (36.6 C) (Temporal)   Ht 5\' 3"  (1.6 m)   Wt 239 lb (108.4 kg)   LMP  (LMP Unknown)   SpO2 98% Comment: on RA  BMI 42.34 kg/m  Physical Exam Constitutional:      Appearance: Normal appearance.  HENT:     Head: Normocephalic and atraumatic.     Mouth/Throat:     Mouth: Mucous membranes are moist.     Pharynx: Oropharynx is clear.  Cardiovascular:     Rate and Rhythm: Normal rate and regular rhythm.  Pulmonary:     Effort: Pulmonary effort is normal.     Breath sounds: Normal breath sounds. No wheezing or rhonchi.     Comments: CTA Skin:    General: Skin is warm and dry.  Neurological:     General: No focal deficit present.     Mental Status: She is alert and oriented to person, place, and time. Mental status is at baseline.  Psychiatric:        Mood and Affect: Mood normal.        Behavior: Behavior normal.        Thought  Content: Thought content normal.        Judgment: Judgment normal.      Lab Results:  CBC    Component Value Date/Time   WBC 7.5 04/12/2020 1426   WBC 5.8 09/22/2019 1156   RBC 4.58 04/12/2020 1426   RBC 4.87 09/22/2019 1156   HGB 13.0 04/12/2020 1426   HCT 39.6 04/12/2020 1426   PLT 246 04/12/2020 1426   MCV 87 04/12/2020 1426   MCV 85 12/04/2013 2302   MCH 28.4 04/12/2020 1426   MCH 28.1 09/22/2019 1156   MCHC 32.8 04/12/2020 1426   MCHC 32.1 09/22/2019 1156   RDW 12.9 04/12/2020 1426   RDW 14.0 12/04/2013 2302    BMET    Component Value Date/Time   NA 139 09/22/2019 1156   NA 139 12/04/2013 2302   K 3.7 09/22/2019 1156   K 3.6 12/04/2013 2302   CL 104 09/22/2019 1156   CL 107  12/04/2013 2302   CO2 25 09/22/2019 1156   CO2 27 12/04/2013 2302   GLUCOSE 117 (H) 09/22/2019 1156   GLUCOSE 92 12/04/2013 2302   BUN 14 09/22/2019 1156   BUN 19 (H) 12/04/2013 2302   CREATININE 0.98 09/22/2019 1156   CREATININE 0.95 12/04/2013 2302   CALCIUM 8.4 (L) 09/22/2019 1156   CALCIUM 8.6 12/04/2013 2302   GFRNONAA >60 09/22/2019 1156   GFRNONAA >60 12/04/2013 2302   GFRAA >60 09/22/2019 1156   GFRAA >60 12/04/2013 2302    BNP No results found for: BNP  ProBNP No results found for: PROBNP  Imaging: US PELVIS TRANSVAGINAL NON-OB (TV ONLY)  Result Date: 04/24/2020 Patient Name: Allison Dixon DOB: May 16, 1961 MRN: [id number] ULTRASOUND REPORT Location: [facility] Date of Service: 04/18/2020 Indications:AUB Findings: The uterus is anteverted and measures 10.2 x 5.4. Echo texture is heterogenous with evidence of focal masses. Within the uterus are multiple suspected fibroids measuring: Fibroid 1:Posterior IM 2.6 x 3.1 x 3.0 cm Fibroid 2:Anterior IM 2.2 x 2.2 x 1.8 cm The Endometrium measures 14 mm. Right Ovary was not seen Left Ovary was not seen Survey of the adnexa demonstrates no adnexal masses. There is no free fluid in the cul de sac. Impression: 1. Thick endometrium as described above. 2. Fibroid uterus 3. Ovaries were not seen due to bowel gas and body habitus. Recommendations: 1.Clinical correlation with the patient's History and Physical Exam. Jenine M. Albertine Grates    RDMS The ultrasound images and findings were reviewed by me and I agree with the above report. Finis Bud, M.D. 04/24/2020 8:23 AM  US PELVIS (TRANSABDOMINAL ONLY)  Result Date: 04/24/2020 See other images-transvaginal The ultrasound images and findings were reviewed by me and I agree with the above report. Finis Bud, M.D. 04/24/2020 8:24 AM  SLEEP STUDY DOCUMENTS  Result Date: 05/01/2020 Ordered by an unspecified provider.  SLEEP STUDY DOCUMENTS  Result Date: 04/16/2020 Ordered by an unspecified  provider.    Assessment & Plan:   Moderate obstructive sleep apnea -Split-night sleep study 04/08/20 showed moderate obstructive sleep apnea- AHI 16.7/hour, SPO2 75% -Treatment options including weight loss, oral appliance, CPAP therapy, surgical intervention. Risk factors of untreated sleep apnea reviewed with patient today including hypertension, heart disease, cardiac arrhythmias, pulmonary hypertension, CVA, diabetes -Patient agreeing to starting CPAP -RX order for CPAP set pressure 10cm h20, mask of choice, supplies, heated humidity and enroll in [facility] -Follow-up in 6 to 8 weeks with Dr. Halford Chessman or APP  Dyspnea on exertion -Chest x-ray 01/25/20 showed  clear lungs -Pulmonary function testing showed no evidence of obstructive or restrictive lung disease -Patient given form today for temporary handicap [place], Allison Dixon 05/07/2020

## 2020-05-07 NOTE — Assessment & Plan Note (Signed)
-  Chest x-ray 01/25/20 showed clear lungs -Pulmonary function testing showed no evidence of obstructive or restrictive lung disease -Patient given form today for temporary handicap placard

## 2020-05-07 NOTE — Assessment & Plan Note (Addendum)
-  Split-night sleep study 04/08/20 showed moderate obstructive sleep apnea- AHI 16.7/hour, SPO2 75% -Treatment options including weight loss, oral appliance, CPAP therapy, surgical intervention. Risk factors of untreated sleep apnea reviewed with patient today including hypertension, heart disease, cardiac arrhythmias, pulmonary hypertension, CVA, diabetes -Patient agreeing to starting CPAP -RX order for CPAP set pressure 10cm h20, mask of choice, supplies, heated humidity and enroll in [facility] -Follow-up in 6 to 8 weeks with Dr. Halford Chessman or APP

## 2020-05-07 NOTE — Patient Instructions (Addendum)
Recommendations: - Sleep study showed moderate obstructive sleep apnea - Starting CPAP therapy, please wear every night for minimum of 4-6 hours or more  - Do not drive if experiencing excessive daytime fatigue or somnolence   Orders: CPAP 10cm h20, mask of choice, supplies, heated humidity, enroll in [place]  Follow-up: 6-8 weeks with Dr. Halford Chessman or APP    Sleep Apnea Sleep apnea affects breathing during sleep. It causes breathing to stop for a short time or to become shallow. It can also increase the risk of:  Heart attack.  Stroke.  Being very overweight (obese).  Diabetes.  Heart failure.  Irregular heartbeat. The goal of treatment is to help you breathe normally again. What are the causes? There are three kinds of sleep apnea:  Obstructive sleep apnea. This is caused by a blocked or collapsed airway.  Central sleep apnea. This happens when the brain does not send the right signals to the muscles that control breathing.  Mixed sleep apnea. This is a combination of obstructive and central sleep apnea. The most common cause of this condition is a collapsed or blocked airway. This can happen if:  Your throat muscles are too relaxed.  Your tongue and tonsils are too large.  You are overweight.  Your airway is too small. What increases the risk?  Being overweight.  Smoking.  Having a small airway.  Being older.  Being female.  Drinking alcohol.  Taking medicines to calm yourself (sedatives or tranquilizers).  Having family members with the condition. What are the signs or symptoms?  Trouble staying asleep.  Being sleepy or tired during the day.  Getting angry a lot.  Loud snoring.  Headaches in the morning.  Not being able to focus your mind (concentrate).  Forgetting things.  Less interest in sex.  Mood swings.  Personality changes.  Feelings of sadness (depression).  Waking up a lot during the night to pee (urinate).  Dry  mouth.  Sore throat. How is this diagnosed?  Your medical history.  A physical exam.  A test that is done when you are sleeping (sleep study). The test is most often done in a sleep lab but may also be done at home. How is this treated?   Sleeping on your side.  Using a medicine to get rid of mucus in your nose (decongestant).  Avoiding the use of alcohol, medicines to help you relax, or certain pain medicines (narcotics).  Losing weight, if needed.  Changing your diet.  Not smoking.  Using a machine to open your airway while you sleep, such as: ? An oral appliance. This is a mouthpiece that shifts your lower jaw forward. ? A CPAP device. This device blows air through a mask when you breathe out (exhale). ? An EPAP device. This has valves that you put in each nostril. ? A BPAP device. This device blows air through a mask when you breathe in (inhale) and breathe out.  Having surgery if other treatments do not work. It is important to get treatment for sleep apnea. Without treatment, it can lead to:  High blood pressure.  Coronary artery disease.  In men, not being able to have an erection (impotence).  Reduced thinking ability. Follow these instructions at home: Lifestyle  Make changes that your doctor recommends.  Eat a healthy diet.  Lose weight if needed.  Avoid alcohol, medicines to help you relax, and some pain medicines.  Do not use any products that contain nicotine or tobacco, such as cigarettes,  e-cigarettes, and chewing tobacco. If you need help quitting, ask your doctor. General instructions  Take over-the-counter and prescription medicines only as told by your doctor.  If you were given a machine to use while you sleep, use it only as told by your doctor.  If you are having surgery, make sure to tell your doctor you have sleep apnea. You may need to bring your device with you.  Keep all follow-up visits as told by your doctor. This is  important. Contact a doctor if:  The machine that you were given to use during sleep bothers you or does not seem to be working.  You do not get better.  You get worse. Get help right away if:  Your chest hurts.  You have trouble breathing in enough air.  You have an uncomfortable feeling in your back, arms, or stomach.  You have trouble talking.  One side of your body feels weak.  A part of your face is hanging down. These symptoms may be an emergency. Do not wait to see if the symptoms will go away. Get medical help right away. Call your local emergency services (911 in the [place] Do not drive yourself to the hospital. Summary  This condition affects breathing during sleep.  The most common cause is a collapsed or blocked airway.  The goal of treatment is to help you breathe normally while you sleep. This information is not intended to replace advice given to you by your health care provider. Make sure you discuss any questions you have with your health care provider. Document Revised: 06/10/2018 Document Reviewed: 04/19/2018 Elsevier Patient Education  [place].    CPAP and BPAP Information CPAP and BPAP are methods of helping a person breathe with the use of air pressure. CPAP stands for "continuous positive airway pressure." BPAP stands for "bi-level positive airway pressure." In both methods, air is blown through your nose or mouth and into your air passages to help you breathe well. CPAP and BPAP use different amounts of pressure to blow air. With CPAP, the amount of pressure stays the same while you breathe in and out. With BPAP, the amount of pressure is increased when you breathe in (inhale) so that you can take larger breaths. Your health care provider will recommend whether CPAP or BPAP would be more helpful for you. Why are CPAP and BPAP treatments used? CPAP or BPAP can be helpful if you have:  Sleep apnea.  Chronic obstructive pulmonary disease  (COPD).  Heart failure.  Medical conditions that weaken the muscles of the chest including muscular dystrophy, or neurological diseases such as amyotrophic lateral sclerosis (ALS).  Other problems that cause breathing to be weak, abnormal, or difficult. CPAP is most commonly used for obstructive sleep apnea (OSA) to keep the airways from collapsing when the muscles relax during sleep. How is CPAP or BPAP administered? Both CPAP and BPAP are provided by a small machine with a flexible plastic tube that attaches to a plastic mask. You wear the mask. Air is blown through the mask into your nose or mouth. The amount of pressure that is used to blow the air can be adjusted on the machine. Your health care provider will determine the pressure setting that should be used based on your individual needs. When should CPAP or BPAP be used? In most cases, the mask only needs to be worn during sleep. Generally, the mask needs to be worn throughout the night and during any daytime naps. People  with certain medical conditions may also need to wear the mask at other times when they are awake. Follow instructions from your health care provider about when to use the machine. What are some tips for using the mask?   Because the mask needs to be snug, some people feel trapped or closed-in (claustrophobic) when first using the mask. If you feel this way, you may need to get used to the mask. One way to do this is by holding the mask loosely over your nose or mouth and then gradually applying the mask more snugly. You can also gradually increase the amount of time that you use the mask.  Masks are available in various types and sizes. Some fit over your mouth and nose while others fit over just your nose. If your mask does not fit well, talk with your health care provider about getting a different one.  If you are using a mask that fits over your nose and you tend to breathe through your mouth, a chin strap may be  applied to help keep your mouth closed.  The CPAP and BPAP machines have alarms that may sound if the mask comes off or develops a leak.  If you have trouble with the mask, it is very important that you talk with your health care provider about finding a way to make the mask easier to tolerate. Do not stop using the mask. Stopping the use of the mask could have a negative impact on your health. What are some tips for using the machine?  Place your CPAP or BPAP machine on a secure table or stand near an electrical outlet.  Know where the on/off switch is located on the machine.  Follow instructions from your health care provider about how to set the pressure on your machine and when you should use it.  Do not eat or drink while the CPAP or BPAP machine is on. Food or fluids could get pushed into your lungs by the pressure of the CPAP or BPAP.  Do not smoke. Tobacco smoke residue can damage the machine.  For home use, CPAP and BPAP machines can be rented or purchased through home health care companies. Many different brands of machines are available. Renting a machine before purchasing may help you find out which particular machine works well for you.  Keep the CPAP or BPAP machine and attachments clean. Ask your health care provider for specific instructions. Get help right away if:  You have redness or open areas around your nose or mouth where the mask fits.  You have trouble using the CPAP or BPAP machine.  You cannot tolerate wearing the CPAP or BPAP mask.  You have pain, discomfort, and bloating in your abdomen. Summary  CPAP and BPAP are methods of helping a person breathe with the use of air pressure.  Both CPAP and BPAP are provided by a small machine with a flexible plastic tube that attaches to a plastic mask.  If you have trouble with the mask, it is very important that you talk with your health care provider about finding a way to make the mask easier to tolerate. This  information is not intended to replace advice given to you by your health care provider. Make sure you discuss any questions you have with your health care provider. Document Revised: 12/14/2018 Document Reviewed: 07/13/2016 Elsevier Patient Education  [place].

## 2020-05-08 NOTE — Progress Notes (Signed)
Reviewed and agree with assessment/plan.   Chesley Mires, MD [facility] Pulmonary/Critical Care 05/08/2020, 7:56 AM Pager:  [phone]

## 2020-05-15 ENCOUNTER — Inpatient Hospital Stay: Payer: Medicaid Other | Attending: Obstetrics and Gynecology | Admitting: Obstetrics and Gynecology

## 2020-05-15 ENCOUNTER — Inpatient Hospital Stay: Payer: Medicaid Other

## 2020-05-15 ENCOUNTER — Other Ambulatory Visit: Payer: Self-pay

## 2020-05-15 VITALS — BP 138/75 | HR 61 | Temp 97.8°F | Wt 241.3 lb

## 2020-05-15 DIAGNOSIS — C541 Malignant neoplasm of endometrium: Secondary | ICD-10-CM | POA: Insufficient documentation

## 2020-05-15 DIAGNOSIS — I10 Essential (primary) hypertension: Secondary | ICD-10-CM | POA: Diagnosis not present

## 2020-05-15 DIAGNOSIS — G4733 Obstructive sleep apnea (adult) (pediatric): Secondary | ICD-10-CM | POA: Insufficient documentation

## 2020-05-15 LAB — HEMOGLOBIN A1C
Hgb A1c MFr Bld: 5.8 % — ABNORMAL HIGH (ref 4.8–5.6)
Mean Plasma Glucose: 119.76 mg/dL

## 2020-05-15 LAB — COMPREHENSIVE METABOLIC PANEL
ALT: 14 U/L (ref 0–44)
AST: 15 U/L (ref 15–41)
Albumin: 3.5 g/dL (ref 3.5–5.0)
Alkaline Phosphatase: 67 U/L (ref 38–126)
Anion gap: 7 (ref 5–15)
BUN: 16 mg/dL (ref 6–20)
CO2: 28 mmol/L (ref 22–32)
Calcium: 8.6 mg/dL — ABNORMAL LOW (ref 8.9–10.3)
Chloride: 107 mmol/L (ref 98–111)
Creatinine, Ser: 0.95 mg/dL (ref 0.44–1.00)
GFR calc Af Amer: >60 mL/min (ref >60)
GFR calc non Af Amer: >60 mL/min (ref >60)
Glucose, Bld: 89 mg/dL (ref 70–99)
Potassium: 3.9 mmol/L (ref 3.5–5.1)
Sodium: 142 mmol/L (ref 135–145)
Total Bilirubin: 0.7 mg/dL (ref 0.3–1.2)
Total Protein: 8 g/dL (ref 6.5–8.1)

## 2020-05-15 LAB — SURGICAL PATHOLOGY

## 2020-05-15 NOTE — H&P (Signed)
Gynecologic Oncology Consult Visit   Referring Provider: Diona Fanti, CNM [address] [place],  [place] [address] [phone]  Chief Concern: endometrial cancer  Subjective:  Allison Dixon is a 59 y.o. G20P7 female who is seen in consultation from Ms. Lawhorn for endometrial cancer.  She presented with abnormal bleeding, irregular x 6-7 months. Her evaluation included the following tests:   Pelvic US 04/18/2020 The uterus is anteverted and measures 10.2 x 5.4. Echo texture is heterogenous with evidence of focal masses. Within the uterus are multiple suspected fibroids measuring: Fibroid 1:Posterior IM 2.6 x 3.1 x 3.0 cm Fibroid 2:Anterior IM 2.2 x 2.2 x 1.8 cm The Endometrium measures 14 mm. Right Ovary was not seen Left Ovary was not seen Survey of the adnexa demonstrates no adnexal masses. There is no free fluid in the cul de sac.  Endometrial biopsy 04/18/2020  A. 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   Problem List: Patient Active Problem List   Diagnosis Date Noted  . Moderate obstructive sleep apnea 05/07/2020  . Dyspnea on exertion 05/07/2020  . Abnormal uterine bleeding 04/18/2020  Ectopic Pregnant  Past Medical History: Past Medical History:  Diagnosis Date  . Hypertension     Past Surgical History:  Procedure Laterality Date  . DILATION AND CURETTAGE, DIAGNOSTIC / THERAPEUTIC    . EXPLORATORY LAPAROTOMY     for ectopic pregnancy     Past Gynecologic History:  As per HPI  OB History:  SVD x 7 OB History  Gravida Para Term Preterm AB Living  7 7 0 0 0 0  SAB TAB Ectopic Multiple Live Births  0 0 0 0 0    # Outcome Date GA Lbr Len/2nd Weight Sex Delivery Anes PTL Lv  7 Para           6 Para           5 Para           4 Para           3 Para           2 Para           1 Para              Obstetric Comments  NSVD x 7    Family History: Family History  Problem Relation Age of Onset  . Heart attack Mother   . Cancer Mother   . Cancer Father     Social History: Social History   Socioeconomic History  . Marital status: Married    Spouse name: Not on file  . Number of children: Not on file  . Years of education: Not on file  . Highest education level: Not on file  Occupational History  . Not on file  Tobacco Use  . Smoking status: Never Smoker  . Smokeless tobacco: Never Used  Vaping Use  . Vaping Use: Never used  Substance and Sexual Activity  . Alcohol use: Never  . Drug use: Never  . Sexual activity: Not Currently  Other Topics Concern  . Not on file  Social History Narrative  . Not on file   Social Determinants of Health   Financial Resource Strain:   . Difficulty of Paying Living Expenses: Not on file  Food Insecurity:   . Worried About Running  Out of Food in the Last Year: Not on file  . Ran Out of Food in the Last Year: Not on file  Transportation Needs:   . Lack of Transportation (Medical): Not on file  . Lack of Transportation (Non-Medical): Not on file  Physical Activity:   . Days of Exercise per Week: Not on file  . Minutes of Exercise per Session: Not on file  Stress:   . Feeling of Stress : Not on file  Social Connections:   . Frequency of Communication with Friends and Family: Not on file  . Frequency of Social Gatherings with Friends and Family: Not on file  . Attends Religious Services: Not on file  . Active Member of Clubs or Organizations: Not on file  . Attends Archivist Meetings: Not on file  . Marital Status: Not on file  Intimate Partner Violence:   . Fear of Current or Ex-Partner: Not on file  . Emotionally Abused: Not on file  . Physically Abused: Not on file  . Sexually Abused: Not on file    Allergies: No Known Allergies  Current Medications: Current Outpatient Medications  Medication  Sig Dispense Refill  . acetaminophen (TYLENOL) 500 MG tablet Take 500 mg by mouth every 6 (six) hours as needed.     No current facility-administered medications for this visit.    Review of Systems  General: negative for fevers, changes in weight or night sweats Skin: negative for changes in moles or sores or rash Eyes: negative for changes in vision HEENT: negative for change in hearing, tinnitus, voice changes Pulmonary: negative for dyspnea, orthopnea, productive cough, wheezing Cardiac: negative for palpitations, pain Gastrointestinal: negative for nausea, vomiting, constipation, diarrhea, hematemesis, hematochezia Genitourinary/Sexual: negative for dysuria, retention, hematuria, incontinence Ob/Gyn:  abnormal bleeding Musculoskeletal:  Pain in legs and joint pain Hematology: negative for easy bruising, abnormal bleeding Neurologic/Psych: negative for headaches, seizures, paralysis, weakness, numbness   Objective:  Physical Examination:  BP (!) 146/128   Pulse 61   Temp 97.8 F (36.6 C)   Wt 241 lb 4.8 oz (109.5 kg)   BMI 42.74 kg/m    ECOG Performance Status: 1 - Symptomatic but completely ambulatory  GENERAL: Patient is a well appearing female in no acute distress HEENT:  PERRL, neck supple with midline trachea. Thyroid without masses.  NODES:  No cervical, supraclavicular, axillary, or inguinal lymphadenopathy palpated.  LUNGS:  Clear to auscultation bilaterally.  No wheezes or rhonchi. HEART:  Regular rate and rhythm. No murmur appreciated. ABDOMEN:  Soft, nontender.  Vertical incision from umbilicus to pubic bone. No ascites or masses.  MSK:  No focal spinal tenderness to palpation. Full range of motion bilaterally in the upper extremities. EXTREMITIES:  No peripheral edema.   SKIN:  Clear with no obvious rashes or skin changes. No nail dyscrasia. NEURO:  Nonfocal. Well oriented.  Appropriate affect.  Pelvic: chaperoned by RN EGBUS: no lesions Cervix: no  lesions, nontender, mobile Vagina: no lesions visualized, no discharge or bleeding; on palpation mobile nodule right lateral vagina irregular shape ~1.5 cm; and scattered nodularity on the right.  Uterus: nontender, mobile unable to determine size due to habitus Adnexa: no palpable masses    Lab Review  Lab Results  Component Value Date   WBC 7.5 04/12/2020   HGB 13.0 04/12/2020   HCT 39.6 04/12/2020   MCV 87 04/12/2020   PLT 246 04/12/2020    Labs on site today: CMP and HbA1c ordered  Radiologic Imaging: CXR 01/25/2020 CLINICAL  DATA:  Shortness of breath  EXAM: CHEST - 2 VIEW  COMPARISON:  July 28, 2019  FINDINGS: Lungs are clear. Heart size and pulmonary vascular normal. No evident adenopathy. No bone lesions.  IMPRESSION: No abnormality noted.   EKG 01/26/2020 Nonspecific intraventicular block; Normal sinus rhythm  Assessment:  Allison Dixon is a 59 y.o. female diagnosed with endometrial cancer s/p exploratory laparotomy for ectopic pregnancy.    Medical co-morbidities complicating care: HTN, obesity and prior abdominal surgery.  Plan:   Problem List Items Addressed This Visit    None    Visit Diagnoses    Endometrial cancer ([place])    -  Primary   Relevant Orders   Comprehensive metabolic panel   Hemoglobin A1c      We discussed options for management including surgery, hormonal therapy, and radiation. We recommend definitive surgical evaluation with laparoscopic TH, BSO, sentinel node injection/mapping/biopsy, and possible pelvic and para-aortic node dissection. She understands there is a risk of laparotomy given her prior surgery.  We requested grade of the biopsy from pathology. Verbally it sounds like she had a grade 1 cancer.   Risks were discussed in detail. These include infection, anesthesia, bleeding, transfusion, wound separation, vaginal cuff dehiscence, medical issues (blood clots, stroke, heart attack, fluid in the lungs, pneumonia,  abnormal heart rhythm), possible exploratory surgery with larger incision, lymphedema, lymphocyst, allergic reaction, injury to adjacent organs (bowel, bladder, blood vessels, nerves, ureters, uterus). I discussed risk of unforeseen complications and low risk of death.    We will schedule surgery with Dr. Fransisca Connors next week and Dr. Marcelline Mates will assist. CMP and HbA1c ordered today.    Gyn VTE Prophylaxis Algorithm  Risk factors for VTE (calculator):  Point value Risk factors  1 point each age 23-60  2 points each BMI >30  3 points each none in this category  5 points each  none in this category   Major surgery (>30 min); known malignancy or concern for malignancy (elevated tumor markers); minimally invasive surgery.  Risk assessment: 0-4 total points; High risk - heparin/UFH and SCDs (preoperative and continued while in the hospital)  If she has a laparotomy then she will need heparin/UFH and SCDs and extended prophylaxis for 28 days.  Suggested return to clinic in  4-6 weeks after surgery.   The patient's diagnosis, an outline of the further diagnostic and laboratory studies which will be required, the recommendation, and alternatives were discussed.  All questions were answered to the patient's satisfaction.  A total of 80 minutes were spent with the patient/family today; >50% was spent in education, counseling and coordination of care for endometrial cancer.    Juliocesar Blasius Gaetana Michaelis, MD      CC:  Diona Fanti, [address] [place],  [place] [address] [phone]

## 2020-05-15 NOTE — Progress Notes (Signed)
Surgery scheduled for 05/22/20 with Dr's Cherry/Berchuck. Pre and post operative teaching completed. Copy of teaching provided in AVS. Provided contact information for any questions.

## 2020-05-15 NOTE — Progress Notes (Signed)
Gynecologic Oncology Consult Visit   Referring Provider: Diona Fanti, CNM [address],  [place] [address] [phone]  Chief Concern: endometrial cancer  Subjective:  Allison Dixon is a 59 y.o. G85P7 female who is seen in consultation from Ms. Lawhorn for endometrial cancer.  She presented with abnormal bleeding, irregular x 6-7 months. Her evaluation included the following tests:   Pelvic US 04/18/2020 The uterus is anteverted and measures 10.2 x 5.4. Echo texture is heterogenous with evidence of focal masses. Within the uterus are multiple suspected fibroids measuring: Fibroid 1:Posterior IM 2.6 x 3.1 x 3.0 cm Fibroid 2:Anterior IM 2.2 x 2.2 x 1.8 cm The Endometrium measures 14 mm. Right Ovary was not seen Left Ovary was not seen Survey of the adnexa demonstrates no adnexal masses. There is no free fluid in the cul de sac.  Endometrial biopsy 04/18/2020  A. 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   Problem List: Patient Active Problem List   Diagnosis Date Noted  . Moderate obstructive sleep apnea 05/07/2020  . Dyspnea on exertion 05/07/2020  . Abnormal uterine bleeding 04/18/2020  Ectopic Pregnant  Past Medical History: Past Medical History:  Diagnosis Date  . Hypertension     Past Surgical History: Past Surgical History:  Procedure Laterality Date  . DILATION AND CURETTAGE, DIAGNOSTIC / THERAPEUTIC    . EXPLORATORY LAPAROTOMY     for ectopic pregnancy    Past Gynecologic History:  As per HPI  OB History:  SVD x 7 OB History  Gravida Para Term Preterm AB Living  7 7 0 0 0 0  SAB TAB Ectopic Multiple Live Births  0 0 0 0 0    # Outcome Date GA Lbr Len/2nd Weight Sex Delivery Anes PTL Lv  7 Para           6 Para           5 Para           4 Para           3 Para           2  Para           1 Para             Obstetric Comments  NSVD x 7    Family History: Family History  Problem Relation Age of Onset  . Heart attack Mother   . Cancer Mother   . Cancer Father     Social History: Social History   Socioeconomic History  . Marital status: Married    Spouse name: Not on file  . Number of children: Not on file  . Years of education: Not on file  . Highest education level: Not on file  Occupational History  . Not on file  Tobacco Use  . Smoking status: Never Smoker  . Smokeless tobacco: Never Used  Vaping Use  . Vaping Use: Never used  Substance and Sexual Activity  . Alcohol use: Never  . Drug use: Never  . Sexual activity: Not Currently  Other Topics Concern  . Not on file  Social History Narrative  . Not on file   Social Determinants of Health   Financial Resource Strain:   . Difficulty of Paying Living Expenses: Not on file  Food Insecurity:   . Worried  About Running Out of Food in the Last Year: Not on file  . Ran Out of Food in the Last Year: Not on file  Transportation Needs:   . Lack of Transportation (Medical): Not on file  . Lack of Transportation (Non-Medical): Not on file  Physical Activity:   . Days of Exercise per Week: Not on file  . Minutes of Exercise per Session: Not on file  Stress:   . Feeling of Stress : Not on file  Social Connections:   . Frequency of Communication with Friends and Family: Not on file  . Frequency of Social Gatherings with Friends and Family: Not on file  . Attends Religious Services: Not on file  . Active Member of Clubs or Organizations: Not on file  . Attends Archivist Meetings: Not on file  . Marital Status: Not on file  Intimate Partner Violence:   . Fear of Current or Ex-Partner: Not on file  . Emotionally Abused: Not on file  . Physically Abused: Not on file  . Sexually Abused: Not on file    Allergies: No Known Allergies  Current Medications: Current Outpatient  Medications  Medication Sig Dispense Refill  . acetaminophen (TYLENOL) 500 MG tablet Take 500 mg by mouth every 6 (six) hours as needed.     No current facility-administered medications for this visit.    Review of Systems  General: negative for fevers, changes in weight or night sweats Skin: negative for changes in moles or sores or rash Eyes: negative for changes in vision HEENT: negative for change in hearing, tinnitus, voice changes Pulmonary: negative for dyspnea, orthopnea, productive cough, wheezing Cardiac: negative for palpitations, pain Gastrointestinal: negative for nausea, vomiting, constipation, diarrhea, hematemesis, hematochezia Genitourinary/Sexual: negative for dysuria, retention, hematuria, incontinence Ob/Gyn:  abnormal bleeding Musculoskeletal:  Pain in legs and joint pain Hematology: negative for easy bruising, abnormal bleeding Neurologic/Psych: negative for headaches, seizures, paralysis, weakness, numbness   Objective:  Physical Examination:  BP (!) 146/128   Pulse 61   Temp 97.8 F (36.6 C)   Wt 241 lb 4.8 oz (109.5 kg)   BMI 42.74 kg/m    ECOG Performance Status: 1 - Symptomatic but completely ambulatory  GENERAL: Patient is a well appearing female in no acute distress HEENT:  PERRL, neck supple with midline trachea. Thyroid without masses.  NODES:  No cervical, supraclavicular, axillary, or inguinal lymphadenopathy palpated.  LUNGS:  Clear to auscultation bilaterally.  No wheezes or rhonchi. HEART:  Regular rate and rhythm. No murmur appreciated. ABDOMEN:  Soft, nontender.  Vertical incision from umbilicus to pubic bone. No ascites or masses.  MSK:  No focal spinal tenderness to palpation. Full range of motion bilaterally in the upper extremities. EXTREMITIES:  No peripheral edema.   SKIN:  Clear with no obvious rashes or skin changes. No nail dyscrasia. NEURO:  Nonfocal. Well oriented.  Appropriate affect.  Pelvic: chaperoned by RN EGBUS: no  lesions Cervix: no lesions, nontender, mobile Vagina: no lesions visualized, no discharge or bleeding; on palpation mobile nodule right lateral vagina irregular shape ~1.5 cm; and scattered nodularity on the right.  Uterus: nontender, mobile unable to determine size due to habitus Adnexa: no palpable masses    Lab Review  Lab Results  Component Value Date   WBC 7.5 04/12/2020   HGB 13.0 04/12/2020   HCT 39.6 04/12/2020   MCV 87 04/12/2020   PLT 246 04/12/2020    Labs on site today: CMP and HbA1c ordered  Radiologic Imaging: CXR  01/25/2020 CLINICAL DATA:  Shortness of breath  EXAM: CHEST - 2 VIEW  COMPARISON:  July 28, 2019  FINDINGS: Lungs are clear. Heart size and pulmonary vascular normal. No evident adenopathy. No bone lesions.  IMPRESSION: No abnormality noted.   EKG 01/26/2020 Nonspecific intraventicular block; Normal sinus rhythm  Assessment:  Allison Dixon is a 59 y.o. female diagnosed with endometrial cancer.   History of exploratory laparotomy for ectopic pregnancy.    Medical co-morbidities complicating care: HTN, obesity and prior abdominal surgery.  Plan:   Problem List Items Addressed This Visit    None    Visit Diagnoses    Endometrial cancer ([place])    -  Primary   Relevant Orders   Comprehensive metabolic panel (Completed)   Hemoglobin A1c      We discussed options for management including surgery, hormonal therapy, and radiation. We recommend definitive surgical evaluation with laparoscopic TH, BSO, sentinel node injection/mapping/biopsy, and possible pelvic and para-aortic node dissection. She understands there is a risk of laparotomy given her prior surgery.  Risks were discussed in detail. These include infection, anesthesia, bleeding, transfusion, wound separation, vaginal cuff dehiscence, medical issues (blood clots, stroke, heart attack, fluid in the lungs, pneumonia, abnormal heart rhythm), possible exploratory surgery with  larger incision, lymphedema, lymphocyst, allergic reaction, injury to adjacent organs (bowel, bladder, blood vessels, nerves, ureters, uterus). I discussed risk of unforeseen complications and low risk of death.    We will schedule surgery with Dr. Fransisca Connors next week and Dr. Marcelline Mates will assist. CMP and HbA1c ordered today.   Suggested return to clinic in  4-6 weeks after surgery.   The patient's diagnosis, an outline of the further diagnostic and laboratory studies which will be required, the recommendation, and alternatives were discussed.  All questions were answered to the patient's satisfaction.  A total of 80 minutes were spent with the patient/family today; >50% was spent in education, counseling and coordination of care for endometrial cancer.    Dezirea Mccollister Gaetana Michaelis, MD  ADDENDUM:   Pathology confirmed grade 1.

## 2020-05-15 NOTE — Patient Instructions (Signed)
In Preparation of your upcoming procedure you are having testing for Covid-19 on Monday,  May 20, 2020 at the [facility]. This is a drive-thru testing site. Please come between 8:00am-1:00pm.  We are asking that you stay at home and avoid visitors from this point until after your procedure is completed to minimize potential for Covid-19 exposure.    Please Wash your hands frequently with soap and water or clean hands with an alcohol-based hand sanitizer often.  Do not touch your eyes, nose, and mouth, especially with unwashed hands.  Should you at any time develop new symptoms of fever, cough, sneezing or runny nose, sore throat, difficulty breathing, or unexplained body aches, we ask that you contact your provider.   It may take up to 48 hours for results of this testing to be made available.   You will not receive notification if test results are negative.  If test results are positive for Covid-19, your provider or his/her representative will notify you by phone, with additional instructions.         DIVISION OF GYNECOLOGIC ONCOLOGY BOWEL PREP   The following instructions are extremely important to prepare for your surgery. Please follow them carefully   Step 1: Liquid Diet Instructions              Clear Liquid Diet for GYN Oncology Patients Day Before Surgery The day before your scheduled surgery DO NOT EAT any solid foods.  We do want you to drink enough liquids, but NO MILK products.  We do not want you to be dehydrated.  Clear liquids are defined as no milk products and no pieces of any solid food. Drink at least 64 oz. of fluid.  The following are all approved for you to drink the day before you surgery.  Chicken, Beef or Vegetable Broth (bouillon or consomm) - NO BROTH AFTER MIDNIGHT  Plain Jello  (no fruit)  Water  Strained lemonade or fruit punch  Gatorade (any flavor)  CLEAR Ensure or Boost Breeze  Fruit juices without pulp, such as apple,  grape, or cranberry juice  Clear sodas - NO SODA AFTER MIDNIGHT  Ice Pops without bits of fruit or fruit pulp  Honey  Tea or coffee without milk or cream                 Any foods not on the above list should be avoided                                                                                             Step 2: Laxatives           The evening before surgery:   Time: around 5pm   Follow these instructions carefully.   Administer 1 Dulcolax suppository according to manufacturer instructions on the box. You will need to purchase this laxative at a pharmacy or grocery store.    Individual responses to laxatives vary; this prep may cause multiple bowel movements. It often works in 30 minutes and may take as long as 3 hours. Stay near an available bathroom.    It is important  to stay hydrated. Ensure you are still drinking clear liquids.     IMPORTANT: FOR YOUR SAFETY, WE WILL HAVE TO CANCEL YOUR SURGERY IF YOU DO NOT FOLLOW THESE INSTRUCTIONS.    Do not eat anything after midnight (including gum or candy) prior to your surgery.  Avoid drinking carbonated beverages after midnight.  You can have clear liquids up until one hour before you arrive at the hospital. "Nothing by mouth" means no liquids, gum, candy, etc for one hour before your arrival time.      Laparoscopy Laparoscopy is a procedure to diagnose diseases in the abdomen. During the procedure, a thin, lighted, pencil-sized instrument called a laparoscope is inserted into the abdomen through an incision. The laparoscope allows your health care provider to look at the organs inside your body. LET [facility] CARE PROVIDER KNOW ABOUT:  Any allergies you have.  All medicines you are taking, including vitamins, herbs, eye drops, creams, and over-the-counter medicines.  Previous problems you or members of your family have had with the use of anesthetics.  Any blood disorders you have.  Previous surgeries you have  had.  Medical conditions you have. RISKS AND COMPLICATIONS  Generally, this is a safe procedure. However, problems can occur, which may include:  Infection.  Bleeding.  Damage to other organs.  Allergic reaction to the anesthetics used during the procedure. BEFORE THE PROCEDURE  Do not eat or drink anything after midnight on the night before the procedure or as directed by your health care provider.  Ask your health care provider about: ? Changing or stopping your regular medicines. ? Taking medicines such as aspirin and ibuprofen. These medicines can thin your blood. Do not take these medicines before your procedure if your health care provider instructs you not to.  Plan to have someone take you home after the procedure. PROCEDURE  You may be given a medicine to help you relax (sedative).  You will be given a medicine to make you sleep (general anesthetic).  Your abdomen will be inflated with a gas. This will make your organs easier to see.  Small incisions will be made in your abdomen.  A laparoscope and other small instruments will be inserted into the abdomen through the incisions.  A tissue sample may be removed from an organ in the abdomen for examination.  The instruments will be removed from the abdomen.  The gas will be released.  The incisions will be closed with stitches (sutures). AFTER THE PROCEDURE  Your blood pressure, heart rate, breathing rate, and blood oxygen level will be monitored often until the medicines you were given have worn off.   This information is not intended to replace advice given to you by your health care provider. Make sure you discuss any questions you have with your health care provider.                                             Bowel Symptoms After Surgery After gynecologic surgery, women often have temporary changes in bowel function (constipation and gas pain).  Following are tips to help prevent and treat common bowel  problems.  It also tells you when to call the doctor.  This is important because some symptoms might be a sign of a more serious bowel problem such as obstruction (bowel blockage).  These problems are rare but can happen after  gynecologic surgery.   Besides surgery, what can temporarily affect bowel function? 1. Dietary changes   2. Decreased physical activity   3.Antibiotics   4. Pain medication   How can I prevent constipation (three days or more without a stool)? 1. Include fiber in your diet: whole grains, raw or dried fruits & vegetables, prunes, prune/pear juiceDrink at least 8 glasses of liquid (preferably water) every day 2. Avoid: ? Gas forming foods such as broccoli, beans, peas, salads, cabbage, sweet potatoes ? Greasy, fatty, or fried foods 3. Activity helps bowel function return to normal, walk around the house at least 3-4 times each day for 15 minutes or longer, if tolerated.  Rocking in a rocking chair is preferable to sitting still. 4. Stool softeners: these are not laxatives, but serve to soften the stool to avoid straining.  Take 2-4 times a day until normal bowel function returns         Examples: Colace or generic equivalent (Docusate) 5. Bulk laxatives: provide a concentrated source of fiber.  They do not stimulate the bowel.  Take 1-2 times each day until normal bowel function return.              Examples: Citrucel, Metamucil, Fiberal, Fibercon   What can I take for "Gas Pains"? 1. Simethicone (Mylicon, Gas-X, Maalox-Gas, Mylanta-Gas) take 3-4 times a day 2. Maalox Regular - take 3-4 times a day 3. Mylanta Regular - take 3-4 times a day   What can I take if I become constipated? 1. Start with stool softeners and add additional laxatives below as needed to have a bowel movement every 1-2 days  2. Stool softeners 1-2 tablets, 2 times a day 3. Senokot 1-2 tablets, 1-2 times a day 4. Glycerin suppository can soften hard stool take once a day 5. Bisacodyl suppository once  a day  6. Milk of Magnesia 30 mL 1-2 times a day 7. Fleets or tap water enema    What can I do for nausea?  1. Limit most solid foods for 24-48 hours 2. Continue eating small frequent amounts of liquids and/or bland soft foods ? Toast, crackers, cooked cereal (grits, cream of wheat, rice) 3. Benadryl: a mild anti-nausea medicine can be obtained without a prescription. May cause drowsiness, especially if taken with narcotic pain medicines 4. Contact provider for prescription nausea medication     What can I do, or take for diarrhea (more than five loose stools per day)? 1. Drink plenty of clear fluids to prevent dehydration 2. May take Kaopectate, Pepto-Bismol, Imodium, or probiotics for 1-2 days 3. Anusol or Preparation-H can be helpful for hemorrhoids and irritated tissue around anus   When should I call the doctor?             CONSTIPATION:   Not relieved after three days following the above program VOMITING:  That contains blood, "coffee ground" material  More the three times/hour and unable to keep down nausea medication for more than eight hours  With dry mouth, dark or strong urine, feeling light-headed, dizzy, or confused  With severe abdominal pain or bloating for more than 24 hours DIARRHEA:  That continues for more then 24-48 hours despite treatment  That contains blood or tarry material  With dry mouth, dark or strong urine, feeling light~headed, dizzy, or confused FEVER:  101 F or higher along with nausea, vomiting, gas pain, diarrhea UNABLE TO:  Pass gas from rectum for more than 24 hours  Tolerate liquids by mouth for  more than 24 hours        Laparoscopic Hysterectomy, Care After Refer to this sheet in the next few weeks. These instructions provide you with information on caring for yourself after your procedure. Your health care provider may also give you more specific instructions. Your treatment has been planned according to current medical  practices, but problems sometimes occur. Call your health care provider if you have any problems or questions after your procedure. What can I expect after the procedure?  Pain and bruising at the incision sites. You will be given pain medicine to control it.  Menopausal symptoms such as hot flashes, night sweats, and insomnia if your ovaries were removed.  Sore throat from the breathing tube that was inserted during surgery. Follow these instructions at home:  Only take over-the-counter or prescription medicines for pain, discomfort, or fever as directed by your health care provider.  Do not take aspirin. It can cause bleeding.  Do not drive when taking pain medicine.  Follow your health care provider's advice regarding diet, exercise, lifting, driving, and general activities.  Resume your usual diet as directed and allowed.  Get plenty of rest and sleep.  Do not douche, use tampons, or have sexual intercourse for at least 6 weeks, or until your health care provider gives you permission.  Change your bandages (dressings) as directed by your health care provider.  Monitor your temperature and notify your health care provider of a fever.  Take showers instead of baths for 2-3 weeks.  Do not drink alcohol until your health care provider gives you permission.  If you develop constipation, you may take a mild laxative with your health care provider's permission. Bran foods may help with constipation problems. Drinking enough fluids to keep your urine clear or pale yellow may help as well.  Try to have someone home with you for 1-2 weeks to help around the house.  Keep all of your follow-up appointments as directed by your health care provider. Contact a health care provider if:  You have swelling, redness, or increasing pain around your incision sites.  You have pus coming from your incision.  You notice a bad smell coming from your incision.  Your incision breaks  open.  You feel dizzy or lightheaded.  You have pain or bleeding when you urinate.  You have persistent diarrhea.  You have persistent nausea and vomiting.  You have abnormal vaginal discharge.  You have a rash.  You have any type of abnormal reaction or develop an allergy to your medicine.  You have poor pain control with your prescribed medicine. Get help right away if:  You have chest pain or shortness of breath.  You have severe abdominal pain that is not relieved with pain medicine.  You have pain or swelling in your legs. This information is not intended to replace advice given to you by your health care provider. Make sure you discuss any questions you have with your health care provider. Document Released: 06/14/2013 Document Revised: 01/30/2016 Document Reviewed: 03/14/2013 Elsevier Interactive Patient Education  2017 [employer].

## 2020-05-15 NOTE — H&P (View-Only) (Signed)
Gynecologic Oncology Consult Visit   Referring Provider: Diona Fanti, CNM [place],   [address] [phone]  Chief Concern: endometrial cancer  Subjective:  Allison Dixon is a 59 y.o. G30P7 female who is seen in consultation from Ms. Lawhorn for endometrial cancer.  She presented with abnormal bleeding, irregular x 6-7 months. Her evaluation included the following tests:   Pelvic US 04/18/2020 The uterus is anteverted and measures 10.2 x 5.4. Echo texture is heterogenous with evidence of focal masses. Within the uterus are multiple suspected fibroids measuring: Fibroid 1:Posterior IM 2.6 x 3.1 x 3.0 cm Fibroid 2:Anterior IM 2.2 x 2.2 x 1.8 cm The Endometrium measures 14 mm. Right Ovary was not seen Left Ovary was not seen Survey of the adnexa demonstrates no adnexal masses. There is no free fluid in the cul de sac.  Endometrial biopsy 04/18/2020  A. 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   Problem List: Patient Active Problem List   Diagnosis Date Noted   Moderate obstructive sleep apnea 05/07/2020   Dyspnea on exertion 05/07/2020   Abnormal uterine bleeding 04/18/2020  Ectopic Pregnant  Past Medical History: Past Medical History:  Diagnosis Date   Hypertension     Past Surgical History:  Procedure Laterality Date   DILATION AND CURETTAGE, DIAGNOSTIC / THERAPEUTIC     EXPLORATORY LAPAROTOMY     for ectopic pregnancy     Past Gynecologic History:  As per HPI  OB History:  SVD x 7 OB History  Gravida Para Term Preterm AB Living  7 7 0 0 0 0  SAB TAB Ectopic Multiple Live Births  0 0 0 0 0    # Outcome Date GA Lbr Len/2nd Weight Sex Delivery Anes PTL Lv  7 Para           6 Para           5 Para           4 Para           3 Para           2 Para           1 Para              Obstetric Comments  NSVD x 7    Family History: Family History  Problem Relation Age of Onset   Heart attack Mother    Cancer Mother    Cancer Father     Social History: Social History   Socioeconomic History   Marital status: Married    Spouse name: Not on file   Number of children: Not on file   Years of education: Not on file   Highest education level: Not on file  Occupational History   Not on file  Tobacco Use   Smoking status: Never Smoker   Smokeless tobacco: Never Used  Vaping Use   Vaping Use: Never used  Substance and Sexual Activity   Alcohol use: Never   Drug use: Never   Sexual activity: Not Currently  Other Topics Concern   Not on file  Social History Narrative   Not on file   Social Determinants of Health   Financial Resource Strain:    Difficulty of Paying Living Expenses: Not on file  Food Insecurity:    Worried About Running  Out of Food in the Last Year: Not on file   Ran Out of Food in the Last Year: Not on file  Transportation Needs:    Lack of Transportation (Medical): Not on file   Lack of Transportation (Non-Medical): Not on file  Physical Activity:    Days of Exercise per Week: Not on file   Minutes of Exercise per Session: Not on file  Stress:    Feeling of Stress : Not on file  Social Connections:    Frequency of Communication with Friends and Family: Not on file   Frequency of Social Gatherings with Friends and Family: Not on file   Attends Religious Services: Not on file   Active Member of Clubs or Organizations: Not on file   Attends Archivist Meetings: Not on file   Marital Status: Not on file  Intimate Partner Violence:    Fear of Current or Ex-Partner: Not on file   Emotionally Abused: Not on file   Physically Abused: Not on file   Sexually Abused: Not on file    Allergies: No Known Allergies  Current Medications: Current Outpatient Medications  Medication  Sig Dispense Refill   acetaminophen (TYLENOL) 500 MG tablet Take 500 mg by mouth every 6 (six) hours as needed.     No current facility-administered medications for this visit.    Review of Systems  General: negative for fevers, changes in weight or night sweats Skin: negative for changes in moles or sores or rash Eyes: negative for changes in vision HEENT: negative for change in hearing, tinnitus, voice changes Pulmonary: negative for dyspnea, orthopnea, productive cough, wheezing Cardiac: negative for palpitations, pain Gastrointestinal: negative for nausea, vomiting, constipation, diarrhea, hematemesis, hematochezia Genitourinary/Sexual: negative for dysuria, retention, hematuria, incontinence Ob/Gyn:  abnormal bleeding Musculoskeletal:  Pain in legs and joint pain Hematology: negative for easy bruising, abnormal bleeding Neurologic/Psych: negative for headaches, seizures, paralysis, weakness, numbness   Objective:  Physical Examination:  BP (!) 146/128    Pulse 61    Temp 97.8 F (36.6 C)    Wt 241 lb 4.8 oz (109.5 kg)    BMI 42.74 kg/m    ECOG Performance Status: 1 - Symptomatic but completely ambulatory  GENERAL: Patient is a well appearing female in no acute distress HEENT:  PERRL, neck supple with midline trachea. Thyroid without masses.  NODES:  No cervical, supraclavicular, axillary, or inguinal lymphadenopathy palpated.  LUNGS:  Clear to auscultation bilaterally.  No wheezes or rhonchi. HEART:  Regular rate and rhythm. No murmur appreciated. ABDOMEN:  Soft, nontender.  Vertical incision from umbilicus to pubic bone. No ascites or masses.  MSK:  No focal spinal tenderness to palpation. Full range of motion bilaterally in the upper extremities. EXTREMITIES:  No peripheral edema.   SKIN:  Clear with no obvious rashes or skin changes. No nail dyscrasia. NEURO:  Nonfocal. Well oriented.  Appropriate affect.  Pelvic: chaperoned by RN EGBUS: no lesions Cervix: no  lesions, nontender, mobile Vagina: no lesions visualized, no discharge or bleeding; on palpation mobile nodule right lateral vagina irregular shape ~1.5 cm; and scattered nodularity on the right.  Uterus: nontender, mobile unable to determine size due to habitus Adnexa: no palpable masses    Lab Review  Lab Results  Component Value Date   WBC 7.5 04/12/2020   HGB 13.0 04/12/2020   HCT 39.6 04/12/2020   MCV 87 04/12/2020   PLT 246 04/12/2020    Labs on site today: CMP and HbA1c ordered  Radiologic  Imaging: CXR 01/25/2020 CLINICAL DATA:  Shortness of breath  EXAM: CHEST - 2 VIEW  COMPARISON:  July 28, 2019  FINDINGS: Lungs are clear. Heart size and pulmonary vascular normal. No evident adenopathy. No bone lesions.  IMPRESSION: No abnormality noted.   EKG 01/26/2020 Nonspecific intraventicular block; Normal sinus rhythm  Assessment:  Allison Dixon is a 59 y.o. female diagnosed with endometrial cancer s/p exploratory laparotomy for ectopic pregnancy.    Medical co-morbidities complicating care: HTN, obesity and prior abdominal surgery.  Plan:   Problem List Items Addressed This Visit    None    Visit Diagnoses    Endometrial cancer ([place])    -  Primary   Relevant Orders   Comprehensive metabolic panel   Hemoglobin A1c      We discussed options for management including surgery, hormonal therapy, and radiation. We recommend definitive surgical evaluation with laparoscopic TH, BSO, sentinel node injection/mapping/biopsy, and possible pelvic and para-aortic node dissection. She understands there is a risk of laparotomy given her prior surgery.  We requested grade of the biopsy from pathology. Verbally it sounds like she had a grade 1 cancer.   Risks were discussed in detail. These include infection, anesthesia, bleeding, transfusion, wound separation, vaginal cuff dehiscence, medical issues (blood clots, stroke, heart attack, fluid in the lungs, pneumonia,  abnormal heart rhythm), possible exploratory surgery with larger incision, lymphedema, lymphocyst, allergic reaction, injury to adjacent organs (bowel, bladder, blood vessels, nerves, ureters, uterus). I discussed risk of unforeseen complications and low risk of death.    We will schedule surgery with Dr. Fransisca Connors next week and Dr. Marcelline Mates will assist. CMP and HbA1c ordered today.    Gyn VTE Prophylaxis Algorithm  Risk factors for VTE (calculator):  Point value Risk factors  1 point each age 40-60  2 points each BMI >30  3 points each none in this category  5 points each  none in this category   Major surgery (>30 min); known malignancy or concern for malignancy (elevated tumor markers); minimally invasive surgery.  Risk assessment: 0-4 total points; High risk - heparin/UFH and SCDs (preoperative and continued while in the hospital)  If she has a laparotomy then she will need heparin/UFH and SCDs and extended prophylaxis for 28 days.  Suggested return to clinic in  4-6 weeks after surgery.   The patient's diagnosis, an outline of the further diagnostic and laboratory studies which will be required, the recommendation, and alternatives were discussed.  All questions were answered to the patient's satisfaction.  A total of 80 minutes were spent with the patient/family today; >50% was spent in education, counseling and coordination of care for endometrial cancer.    Jyla Hopf Gaetana Michaelis, MD      CC:  Diona Fanti, [address] [place],  [place] [address] [phone]

## 2020-05-17 ENCOUNTER — Telehealth: Payer: Self-pay

## 2020-05-17 NOTE — Telephone Encounter (Signed)
Called and notified Allison Dixon with PAT appointment details and the change of her covid testing to 9/14. Read back performed.

## 2020-05-20 ENCOUNTER — Other Ambulatory Visit: Payer: Medicaid Other

## 2020-05-21 ENCOUNTER — Other Ambulatory Visit
Admission: RE | Admit: 2020-05-21 | Discharge: 2020-05-21 | Disposition: A | Discharge status: Home / Self Care | Payer: Medicaid Other | Source: Ambulatory Visit | Attending: Obstetrics and Gynecology | Admitting: Obstetrics and Gynecology

## 2020-05-21 ENCOUNTER — Other Ambulatory Visit: Payer: Self-pay

## 2020-05-21 DIAGNOSIS — Z01812 Encounter for preprocedural laboratory examination: Secondary | ICD-10-CM | POA: Diagnosis not present

## 2020-05-21 DIAGNOSIS — Z20822 Contact with and (suspected) exposure to covid-19: Secondary | ICD-10-CM | POA: Insufficient documentation

## 2020-05-21 HISTORY — DX: Sleep apnea, unspecified: G47.30

## 2020-05-21 LAB — TYPE AND SCREEN
ABO/RH(D): B POS
Antibody Screen: NEGATIVE

## 2020-05-21 NOTE — Patient Instructions (Signed)
Your procedure is scheduled on: Wednesday May 22, 2020. Report to Day Surgery inside [facility] 2nd floor. To find out your arrival time please call [phone] between 1PM - 3PM on Tuesday May 21, 2020.  Remember: Instructions that are not followed completely may result in serious medical risk,  up to and including death, or upon the discretion of your surgeon and anesthesiologist your  surgery may need to be rescheduled.     _X__ 1. Do not eat food after midnight the night before your procedure.                 No chewing gum or hard candies. You may drink clear liquids up to 2 hours                 before you are scheduled to arrive for your surgery- DO not drink clear                 liquids within 2 hours of the start of your surgery.                 Clear Liquids include:  water, apple juice without pulp, clear Gatorade, G2 or                  Gatorade Zero (avoid Red/Purple/Blue), Black Coffee or Tea (Do not add                 anything to coffee or tea).  __X__2.   Complete the "Ensure Clear Pre-surgery Clear Carbohydrate Drink" provided to you, 2 hours before arrival. **If you       are diabetic you will be provided with an alternative drink, Gatorade Zero or G2.  __X__3.  On the morning of surgery brush your teeth with toothpaste and water, you                may rinse your mouth with mouthwash if you wish.  Do not swallow any toothpaste of mouthwash.     _X__ 4.  No Alcohol for 24 hours before or after surgery.   _X__ 5.  Do Not Smoke or use e-cigarettes For 24 Hours Prior to Your Surgery.                 Do not use any chewable tobacco products for at least 6 hours prior to                 Surgery.  _X__  6.  Do not use any recreational drugs (marijuana, cocaine, heroin, ecstasy, MDMA or other)                For at least one week prior to your surgery.  Combination of these drugs with anesthesia                May have life threatening  results.  __X__  7.  Notify your doctor if there is any change in your medical condition      (cold, fever, infections).     Do not wear jewelry, make-up, hairpins, clips or nail polish. Do not wear lotions, powders, or perfumes. You may wear deodorant. Do not shave 48 hours prior to surgery. Men may shave face and neck. Do not bring valuables to the hospital.    [facility] is not responsible for any belongings or valuables.  Contacts, dentures or bridgework may not be worn into surgery. Leave your suitcase in the car. After surgery it may  be brought to your room. For patients admitted to the hospital, discharge time is determined by your treatment team.   Patients discharged the day of surgery will not be allowed to drive home.   Make arrangements for someone to be with you for the first 24 hours of your Same Day Discharge.   ____ Take these medicines the morning of surgery with A SIP OF WATER:    1. none     __x__ Use CHG Soap as directed  __x__ Stop Anti-inflammatories such as Ibuprofen, Aleve, Advil, naproxen, aspirin and or BC powders.    __x__ Stop supplements until after surgery.    __x__ Do not start any herbal supplements before your surgery.     If you have any questions regarding your pre-procedure instructions,  Please call Pre-admit Testing at [phone].

## 2020-05-22 ENCOUNTER — Ambulatory Visit: Payer: Medicaid Other | Admitting: Anesthesiology

## 2020-05-22 ENCOUNTER — Encounter
Admission: RE | Disposition: A | Discharge status: Home / Self Care | Payer: Self-pay | Source: Home / Self Care | Attending: Obstetrics and Gynecology

## 2020-05-22 ENCOUNTER — Encounter: Payer: Self-pay | Admitting: Obstetrics and Gynecology

## 2020-05-22 ENCOUNTER — Ambulatory Visit
Admission: RE | Admit: 2020-05-22 | Discharge: 2020-05-22 | Disposition: A | Discharge status: Home / Self Care | Payer: Medicaid Other | Attending: Obstetrics and Gynecology | Admitting: Obstetrics and Gynecology

## 2020-05-22 DIAGNOSIS — Z90710 Acquired absence of both cervix and uterus: Secondary | ICD-10-CM

## 2020-05-22 DIAGNOSIS — L570 Actinic keratosis: Secondary | ICD-10-CM | POA: Insufficient documentation

## 2020-05-22 DIAGNOSIS — Z809 Family history of malignant neoplasm, unspecified: Secondary | ICD-10-CM | POA: Diagnosis not present

## 2020-05-22 DIAGNOSIS — L814 Other melanin hyperpigmentation: Secondary | ICD-10-CM | POA: Insufficient documentation

## 2020-05-22 DIAGNOSIS — Z8249 Family history of ischemic heart disease and other diseases of the circulatory system: Secondary | ICD-10-CM | POA: Insufficient documentation

## 2020-05-22 DIAGNOSIS — N800 Endometriosis of uterus: Secondary | ICD-10-CM | POA: Insufficient documentation

## 2020-05-22 DIAGNOSIS — N369 Urethral disorder, unspecified: Secondary | ICD-10-CM | POA: Diagnosis not present

## 2020-05-22 DIAGNOSIS — I10 Essential (primary) hypertension: Secondary | ICD-10-CM | POA: Diagnosis not present

## 2020-05-22 DIAGNOSIS — N72 Inflammatory disease of cervix uteri: Secondary | ICD-10-CM | POA: Diagnosis not present

## 2020-05-22 DIAGNOSIS — G4733 Obstructive sleep apnea (adult) (pediatric): Secondary | ICD-10-CM | POA: Diagnosis not present

## 2020-05-22 DIAGNOSIS — R0600 Dyspnea, unspecified: Secondary | ICD-10-CM | POA: Insufficient documentation

## 2020-05-22 DIAGNOSIS — C541 Malignant neoplasm of endometrium: Secondary | ICD-10-CM | POA: Diagnosis not present

## 2020-05-22 DIAGNOSIS — Z79899 Other long term (current) drug therapy: Secondary | ICD-10-CM | POA: Diagnosis not present

## 2020-05-22 DIAGNOSIS — Z6841 Body Mass Index (BMI) 40.0 and over, adult: Secondary | ICD-10-CM | POA: Diagnosis not present

## 2020-05-22 HISTORY — PX: TOTAL LAPAROSCOPIC HYSTERECTOMY WITH BILATERAL SALPINGO OOPHORECTOMY: SHX6845

## 2020-05-22 LAB — ABO/RH: ABO/RH(D): B POS

## 2020-05-22 LAB — SARS CORONAVIRUS 2 (TAT 6-24 HRS): SARS Coronavirus 2: NEGATIVE

## 2020-05-22 SURGERY — HYSTERECTOMY, TOTAL, LAPAROSCOPIC, WITH BILATERAL SALPINGO-OOPHORECTOMY
Anesthesia: General

## 2020-05-22 MED ORDER — FENTANYL CITRATE (PF) 100 MCG/2ML IJ SOLN
INTRAMUSCULAR | Status: AC
  Administered 2020-05-22: 25 ug via INTRAVENOUS
  Filled 2020-05-22: qty 2

## 2020-05-22 MED ORDER — OXYCODONE-ACETAMINOPHEN 5-325 MG PO TABS: 1.0000 | ORAL_TABLET | ORAL | 0 refills | Status: DC | PRN

## 2020-05-22 MED ORDER — INDOCYANINE GREEN 25 MG IV SOLR
INTRAVENOUS | Status: DC | PRN
  Administered 2020-05-22: 5 mg

## 2020-05-22 MED ORDER — CEFAZOLIN SODIUM-DEXTROSE 2-4 GM/100ML-% IV SOLN
2.0000 g | INTRAVENOUS | Status: AC
  Administered 2020-05-22: 2 g via INTRAVENOUS

## 2020-05-22 MED ORDER — PROMETHAZINE HCL 25 MG/ML IJ SOLN: 6.2500 mg | INTRAMUSCULAR | Status: DC | PRN

## 2020-05-22 MED ORDER — SUGAMMADEX SODIUM 200 MG/2ML IV SOLN
INTRAVENOUS | Status: DC | PRN
  Administered 2020-05-22: 300 mg via INTRAVENOUS

## 2020-05-22 MED ORDER — OXYCODONE HCL 5 MG PO TABS: 5.0000 mg | ORAL_TABLET | Freq: Once | ORAL | Status: DC | PRN

## 2020-05-22 MED ORDER — HEPARIN SODIUM (PORCINE) 5000 UNIT/ML IJ SOLN: 5000.0000 [IU] | INTRAMUSCULAR | Status: AC

## 2020-05-22 MED ORDER — ONDANSETRON HCL 4 MG/2ML IJ SOLN
INTRAMUSCULAR | Status: DC | PRN
  Administered 2020-05-22: 4 mg via INTRAVENOUS

## 2020-05-22 MED ORDER — ACETAMINOPHEN 10 MG/ML IV SOLN
INTRAVENOUS | Status: AC
  Filled 2020-05-22: qty 100

## 2020-05-22 MED ORDER — METHYLENE BLUE 0.5 % INJ SOLN
INTRAVENOUS | Status: AC
  Filled 2020-05-22: qty 10

## 2020-05-22 MED ORDER — CEFAZOLIN SODIUM-DEXTROSE 2-4 GM/100ML-% IV SOLN
INTRAVENOUS | Status: AC
  Filled 2020-05-22: qty 100

## 2020-05-22 MED ORDER — GLYCOPYRROLATE 0.2 MG/ML IJ SOLN
INTRAMUSCULAR | Status: DC | PRN
  Administered 2020-05-22: .2 mg via INTRAVENOUS

## 2020-05-22 MED ORDER — CHLORHEXIDINE GLUCONATE 0.12 % MT SOLN
OROMUCOSAL | Status: AC
  Administered 2020-05-22: 15 mL via OROMUCOSAL
  Filled 2020-05-22: qty 15

## 2020-05-22 MED ORDER — FENTANYL CITRATE (PF) 100 MCG/2ML IJ SOLN
INTRAMUSCULAR | Status: AC
  Filled 2020-05-22: qty 2

## 2020-05-22 MED ORDER — DEXAMETHASONE SODIUM PHOSPHATE 10 MG/ML IJ SOLN
INTRAMUSCULAR | Status: DC | PRN
  Administered 2020-05-22: 5 mg via INTRAVENOUS

## 2020-05-22 MED ORDER — FENTANYL CITRATE (PF) 100 MCG/2ML IJ SOLN
25.0000 ug | INTRAMUSCULAR | Status: DC | PRN
  Administered 2020-05-22 (×3): 25 ug via INTRAVENOUS

## 2020-05-22 MED ORDER — BUPIVACAINE HCL (PF) 0.5 % IJ SOLN
INTRAMUSCULAR | Status: AC
  Filled 2020-05-22: qty 30

## 2020-05-22 MED ORDER — ACETAMINOPHEN 10 MG/ML IV SOLN
INTRAVENOUS | Status: DC | PRN
  Administered 2020-05-22: 1000 mg via INTRAVENOUS

## 2020-05-22 MED ORDER — OXYCODONE-ACETAMINOPHEN 5-325 MG PO TABS
1.0000 | ORAL_TABLET | ORAL | Status: DC | PRN
  Administered 2020-05-22: 1 via ORAL

## 2020-05-22 MED ORDER — DOCUSATE SODIUM 100 MG PO CAPS: 100.0000 mg | ORAL_CAPSULE | Freq: Two times a day (BID) | ORAL | 2 refills | Status: DC | PRN

## 2020-05-22 MED ORDER — BUPIVACAINE HCL (PF) 0.5 % IJ SOLN
INTRAMUSCULAR | Status: DC | PRN
  Administered 2020-05-22: 11 mL

## 2020-05-22 MED ORDER — FAMOTIDINE 20 MG PO TABS: 20.0000 mg | ORAL_TABLET | Freq: Once | ORAL | Status: AC

## 2020-05-22 MED ORDER — MIDAZOLAM HCL 2 MG/2ML IJ SOLN
INTRAMUSCULAR | Status: AC
  Filled 2020-05-22: qty 2

## 2020-05-22 MED ORDER — SIMETHICONE 80 MG PO CHEW: 80.0000 mg | CHEWABLE_TABLET | Freq: Four times a day (QID) | ORAL | 1 refills | Status: DC | PRN

## 2020-05-22 MED ORDER — LACTATED RINGERS IV SOLN: INTRAVENOUS | Status: DC

## 2020-05-22 MED ORDER — OXYCODONE HCL 5 MG/5ML PO SOLN: 5.0000 mg | Freq: Once | ORAL | Status: DC | PRN

## 2020-05-22 MED ORDER — PROPOFOL 10 MG/ML IV BOLUS
INTRAVENOUS | Status: DC | PRN
  Administered 2020-05-22: 120 mg via INTRAVENOUS

## 2020-05-22 MED ORDER — PROPOFOL 500 MG/50ML IV EMUL
INTRAVENOUS | Status: AC
  Filled 2020-05-22: qty 50

## 2020-05-22 MED ORDER — FAMOTIDINE 20 MG PO TABS
ORAL_TABLET | ORAL | Status: AC
  Administered 2020-05-22: 20 mg via ORAL
  Filled 2020-05-22: qty 1

## 2020-05-22 MED ORDER — LIDOCAINE HCL (PF) 2 % IJ SOLN
INTRAMUSCULAR | Status: AC
  Filled 2020-05-22: qty 5

## 2020-05-22 MED ORDER — LIDOCAINE HCL (CARDIAC) PF 100 MG/5ML IV SOSY
PREFILLED_SYRINGE | INTRAVENOUS | Status: DC | PRN
  Administered 2020-05-22: 50 mg via INTRAVENOUS

## 2020-05-22 MED ORDER — SUCCINYLCHOLINE CHLORIDE 200 MG/10ML IV SOSY
PREFILLED_SYRINGE | INTRAVENOUS | Status: AC
  Filled 2020-05-22: qty 10

## 2020-05-22 MED ORDER — KETOROLAC TROMETHAMINE 30 MG/ML IJ SOLN
INTRAMUSCULAR | Status: DC | PRN
  Administered 2020-05-22: 30 mg via INTRAVENOUS

## 2020-05-22 MED ORDER — SEVOFLURANE IN SOLN
RESPIRATORY_TRACT | Status: AC
  Filled 2020-05-22: qty 250

## 2020-05-22 MED ORDER — MEPERIDINE HCL 50 MG/ML IJ SOLN: 6.2500 mg | INTRAMUSCULAR | Status: DC | PRN

## 2020-05-22 MED ORDER — ONDANSETRON HCL 4 MG/2ML IJ SOLN
INTRAMUSCULAR | Status: AC
  Filled 2020-05-22: qty 2

## 2020-05-22 MED ORDER — ORAL CARE MOUTH RINSE: 15.0000 mL | Freq: Once | OROMUCOSAL | Status: AC

## 2020-05-22 MED ORDER — MIDAZOLAM HCL 2 MG/2ML IJ SOLN
INTRAMUSCULAR | Status: DC | PRN
  Administered 2020-05-22: 2 mg via INTRAVENOUS

## 2020-05-22 MED ORDER — HEPARIN SODIUM (PORCINE) 5000 UNIT/ML IJ SOLN
INTRAMUSCULAR | Status: AC
  Administered 2020-05-22: 5000 [IU] via SUBCUTANEOUS
  Filled 2020-05-22: qty 1

## 2020-05-22 MED ORDER — ROCURONIUM BROMIDE 100 MG/10ML IV SOLN
INTRAVENOUS | Status: DC | PRN
  Administered 2020-05-22 (×2): 10 mg via INTRAVENOUS
  Administered 2020-05-22: 20 mg via INTRAVENOUS
  Administered 2020-05-22: 50 mg via INTRAVENOUS

## 2020-05-22 MED ORDER — OXYCODONE-ACETAMINOPHEN 5-325 MG PO TABS
ORAL_TABLET | ORAL | Status: DC
Start: 2020-05-22 — End: 2020-05-22
  Filled 2020-05-22: qty 1

## 2020-05-22 MED ORDER — IBUPROFEN 800 MG PO TABS: 800.0000 mg | ORAL_TABLET | Freq: Three times a day (TID) | ORAL | 1 refills | Status: DC | PRN

## 2020-05-22 MED ORDER — DEXAMETHASONE SODIUM PHOSPHATE 10 MG/ML IJ SOLN
INTRAMUSCULAR | Status: AC
  Filled 2020-05-22: qty 1

## 2020-05-22 MED ORDER — FENTANYL CITRATE (PF) 100 MCG/2ML IJ SOLN
INTRAMUSCULAR | Status: DC | PRN
  Administered 2020-05-22: 50 ug via INTRAVENOUS
  Administered 2020-05-22: 25 ug via INTRAVENOUS
  Administered 2020-05-22 (×2): 50 ug via INTRAVENOUS
  Administered 2020-05-22: 25 ug via INTRAVENOUS

## 2020-05-22 MED ORDER — GLYCOPYRROLATE 0.2 MG/ML IJ SOLN
INTRAMUSCULAR | Status: AC
Start: 2020-05-22 — End: ?
  Filled 2020-05-22: qty 1

## 2020-05-22 MED ORDER — CHLORHEXIDINE GLUCONATE 0.12 % MT SOLN: 15.0000 mL | Freq: Once | OROMUCOSAL | Status: AC

## 2020-05-22 MED ORDER — SUCCINYLCHOLINE CHLORIDE 20 MG/ML IJ SOLN
INTRAMUSCULAR | Status: DC | PRN
  Administered 2020-05-22: 100 mg via INTRAVENOUS

## 2020-05-22 MED ORDER — ROCURONIUM BROMIDE 10 MG/ML (PF) SYRINGE
PREFILLED_SYRINGE | INTRAVENOUS | Status: AC
  Filled 2020-05-22: qty 10

## 2020-05-22 SURGICAL SUPPLY — 99 items
ADH SKN CLS APL DERMABOND .7 (GAUZE/BANDAGES/DRESSINGS) ×2
AGENT HMST MTR 8 SURGIFLO (HEMOSTASIS)
APL ESCP 34 STRL LF DISP (HEMOSTASIS)
APL PRP STRL LF DISP 70% ISPRP (MISCELLANEOUS) ×2
APPLICATOR SURGIFLO ENDO (HEMOSTASIS) IMPLANT
BAG DRN RND TRDRP ANRFLXCHMBR (UROLOGICAL SUPPLIES) ×2
BAG URINE DRAIN 2000ML AR STRL (UROLOGICAL SUPPLIES) ×4 IMPLANT
BLADE SURG 15 STRL LF DISP TIS (BLADE) ×2 IMPLANT
BLADE SURG 15 STRL SS (BLADE) ×4
CANISTER SUCT 1200ML W/VALVE (MISCELLANEOUS) ×4 IMPLANT
CANNULA DILATOR  5MM W/SLV (CANNULA) ×1
CANNULA DILATOR 5 W/SLV (CANNULA) ×3 IMPLANT
CATH FOLEY 2WAY  5CC 16FR (CATHETERS) ×2
CATH FOLEY 2WAY 5CC 16FR (CATHETERS) ×2
CATH URTH 16FR FL 2W BLN LF (CATHETERS) ×2 IMPLANT
CHLORAPREP W/TINT 26 (MISCELLANEOUS) ×4 IMPLANT
CNTNR SPEC 2.5X3XGRAD LEK (MISCELLANEOUS) ×2
CONT SPEC 4OZ STER OR WHT (MISCELLANEOUS) ×2
CONT SPEC 4OZ STRL OR WHT (MISCELLANEOUS) ×2
CONTAINER SPEC 2.5X3XGRAD LEK (MISCELLANEOUS) ×2 IMPLANT
CORD MONOPOLAR M/FML 12FT (MISCELLANEOUS) ×4 IMPLANT
COVER WAND RF STERILE (DRAPES) ×4 IMPLANT
DEFOGGER SCOPE WARMER CLEARIFY (MISCELLANEOUS) ×4 IMPLANT
DERMABOND ADVANCED (GAUZE/BANDAGES/DRESSINGS) ×2
DERMABOND ADVANCED .7 DNX12 (GAUZE/BANDAGES/DRESSINGS) ×2 IMPLANT
DEVICE SUTURE ENDOST 10MM (ENDOMECHANICALS) ×4 IMPLANT
DRAPE LAP W/FLUID (DRAPES) ×4 IMPLANT
DRAPE LAPAROTOMY 100X77 ABD (DRAPES) ×4 IMPLANT
DRAPE LAPAROTOMY TRNSV 106X77 (MISCELLANEOUS) ×4 IMPLANT
DRAPE PERI LITHO V/GYN (MISCELLANEOUS) ×4 IMPLANT
DRAPE STERI POUCH LG 24X46 STR (DRAPES) ×8 IMPLANT
DRAPE UNDER BUTTOCK W/FLU (DRAPES) ×4 IMPLANT
DRSG TELFA 3X8 NADH (GAUZE/BANDAGES/DRESSINGS) ×8 IMPLANT
ELECT BLADE 6 FLAT ULTRCLN (ELECTRODE) ×4 IMPLANT
ELECT CAUTERY BLADE 6.4 (BLADE) ×4 IMPLANT
ELECT REM PT RETURN 9FT ADLT (ELECTROSURGICAL) ×4
ELECTRODE REM PT RTRN 9FT ADLT (ELECTROSURGICAL) ×2 IMPLANT
GAUZE SPONGE 4X4 12PLY STRL (GAUZE/BANDAGES/DRESSINGS) ×4 IMPLANT
GLOVE BIO SURGEON STRL SZ8 (GLOVE) ×32 IMPLANT
GLOVE INDICATOR 8.0 STRL GRN (GLOVE) ×12 IMPLANT
GOWN STRL REUS W/ TWL LRG LVL3 (GOWN DISPOSABLE) ×4 IMPLANT
GOWN STRL REUS W/ TWL XL LVL3 (GOWN DISPOSABLE) ×6 IMPLANT
GOWN STRL REUS W/TWL LRG LVL3 (GOWN DISPOSABLE) ×8
GOWN STRL REUS W/TWL XL LVL3 (GOWN DISPOSABLE) ×12
GRASPER SUT TROCAR 14GX15 (MISCELLANEOUS) ×4 IMPLANT
IRRIGATION STRYKERFLOW (MISCELLANEOUS) ×2 IMPLANT
IRRIGATOR STRYKERFLOW (MISCELLANEOUS) ×4
IV LACTATED RINGERS 1000ML (IV SOLUTION) IMPLANT
KIT IMAGING PINPOINTPAQ (MISCELLANEOUS) ×4 IMPLANT
KIT PINK PAD W/HEAD ARE REST (MISCELLANEOUS) ×4
KIT PINK PAD W/HEAD ARM REST (MISCELLANEOUS) ×2 IMPLANT
KIT TURNOVER CYSTO (KITS) ×4 IMPLANT
LABEL OR SOLS (LABEL) ×4 IMPLANT
LIGASURE IMPACT 36 18CM CVD LR (INSTRUMENTS) IMPLANT
LIGASURE VESSEL 5MM BLUNT TIP (ELECTROSURGICAL) ×4 IMPLANT
MANIPULATOR VCARE LG CRV RETR (MISCELLANEOUS) IMPLANT
MANIPULATOR VCARE SML CRV RETR (MISCELLANEOUS) IMPLANT
MANIPULATOR VCARE STD CRV RETR (MISCELLANEOUS) ×4 IMPLANT
NDL INSUFF ACCESS 14 VERSASTEP (NEEDLE) ×4 IMPLANT
NEEDLE SPNL 22GX5 LNG QUINC BK (NEEDLE) IMPLANT
NS IRRIG 1000ML POUR BTL (IV SOLUTION) ×4 IMPLANT
NS IRRIG 500ML POUR BTL (IV SOLUTION) ×4 IMPLANT
OCCLUDER COLPOPNEUMO (BALLOONS) ×4 IMPLANT
PACK BASIN MAJOR ARMC (MISCELLANEOUS) ×4 IMPLANT
PACK GYN LAPAROSCOPIC (MISCELLANEOUS) ×4 IMPLANT
PAD OB MATERNITY 4.3X12.25 (PERSONAL CARE ITEMS) ×4 IMPLANT
PAD PREP 24X41 OB/GYN DISP (PERSONAL CARE ITEMS) ×4 IMPLANT
PORT ACCESS TROCAR AIRSEAL 5 (TROCAR) ×4 IMPLANT
POUCH ENDO CATCH II 15MM (MISCELLANEOUS) IMPLANT
SET CYSTO W/LG BORE CLAMP LF (SET/KITS/TRAYS/PACK) ×4 IMPLANT
SET TRI-LUMEN FLTR TB AIRSEAL (TUBING) ×4 IMPLANT
SHEARS ENDO 5MM 31CM (CUTTER) ×4 IMPLANT
SPOGE SURGIFLO 8M (HEMOSTASIS)
SPONGE LAP 18X18 RF (DISPOSABLE) ×4 IMPLANT
SPONGE LAP 4X18 RFD (DISPOSABLE) IMPLANT
SPONGE SURGIFLO 8M (HEMOSTASIS) IMPLANT
STAPLER SKIN PROX 35W (STAPLE) ×4 IMPLANT
SUT ENDO VLOC 180-0-8IN (SUTURE) ×4 IMPLANT
SUT MAXON ABS #0 GS21 30IN (SUTURE) IMPLANT
SUT MNCRL 4-0 (SUTURE) ×8
SUT MNCRL 4-0 27XMFL (SUTURE) ×4
SUT PDS AB 1 TP1 96 (SUTURE) IMPLANT
SUT PLAIN 2 0 XLH (SUTURE) IMPLANT
SUT VIC AB 0 CT1 27 (SUTURE)
SUT VIC AB 0 CT1 27XCR 8 STRN (SUTURE) IMPLANT
SUT VIC AB 0 CT1 36 (SUTURE) ×4 IMPLANT
SUT VIC AB 2-0 UR6 27 (SUTURE) IMPLANT
SUT VICRYL 0 AB UR-6 (SUTURE) ×4 IMPLANT
SUT VICRYL AB 3-0 FS1 BRD 27IN (SUTURE) IMPLANT
SUTURE MNCRL 4-0 27XMF (SUTURE) ×4 IMPLANT
SYR 10ML LL (SYRINGE) ×4 IMPLANT
SYR 20ML LL LF (SYRINGE) IMPLANT
SYR 3ML LL SCALE MARK (SYRINGE) ×8 IMPLANT
SYR 50ML LL SCALE MARK (SYRINGE) ×8 IMPLANT
SYR BULB IRRIG 60ML STRL (SYRINGE) ×4 IMPLANT
TRAY FOLEY MTR SLVR 16FR STAT (SET/KITS/TRAYS/PACK) IMPLANT
TROCAR BLUNT TIP 12MM OMST12BT (TROCAR) ×4 IMPLANT
TROCAR VERSASTEP PLUS 12MM (TROCAR) ×4 IMPLANT
TROCAR VERSASTEP PLUS 5MM (TROCAR) ×4 IMPLANT

## 2020-05-22 NOTE — Progress Notes (Signed)
   05/22/20 0750  Clinical Encounter Type  Visited With Family  Visit Type Initial  Referral From Chaplain  Consult/Referral To Chaplain  While rounding [facility] waiting area, chaplain visited with pt's son, daughter, and sister. Done of them had questions or concerns. Chaplain wished them well.

## 2020-05-22 NOTE — Interval H&P Note (Signed)
History and Physical Interval Note:  05/22/2020 8:00 AM  Allison Dixon  has presented today for surgery, with the diagnosis of Endometrial cancer.  The various methods of treatment have been discussed with the patient and family. After consideration of risks, benefits and other options for treatment, the patient has consented to  Procedure(s): TOTAL LAPAROSCOPIC HYSTERECTOMY WITH BILATERAL SALPINGO OOPHORECTOMY (N/A) SENTINEL NODE BIOPSY (N/A) INDOCYANINE GREEN FLUORESCENCE IMAGING (ICG) (N/A) as a surgical intervention.  The patient's history has been reviewed, patient examined, no change in status, stable for surgery.  I have reviewed the patient's chart and labs.  Questions were answered to the patient's satisfaction.    I met with her and confirmed the plan.  Mellody Drown

## 2020-05-22 NOTE — OR Nursing (Signed)
Dr. Fransisca Connors in to see pt @ 2:01 pm.

## 2020-05-22 NOTE — Discharge Instructions (Signed)

## 2020-05-22 NOTE — Op Note (Signed)
Operative Note   05/22/2020 11:56 AM  PRE-OP DIAGNOSIS: Endometrial cancer, grade 1, morbid obesity BMI 42   POST-OP DIAGNOSIS: Endometrial cancer, grade 1, morbid obesity BMI 42, periurethral pigmented lesion  SURGEON: Surgeon(s) and Role:    Rubie Maid, MD - Primary  ASSISTANT: Mellody Drown, MD  ANESTHESIA: General ET  PROCEDURE: TOTAL LAPAROSCOPIC HYSTERECTOMY WITH BILATERAL SALPINGO OOPHORECTOMY AND WASHINGS, BILATERAL PELVIC SENTINEL LYMPH NODE MAPPING AND BIOPSIES, RIGHT PERIURETHRAL BIOPSY   ESTIMATED BLOOD LOSS: 50 ML  DRAINS: none  TOTAL IV FLUIDS: per anesthesia  SPECIMENS: uterus, adnexa, washings, right periurethral biopsy  COMPLICATIONS: none  DISPOSITION: PACU - hemodynamically stable.  CONDITION: stable  INDICATIONS: grade 1 endometrial cancer  FINDINGS: Normal uterus, tubes and ovaries.  Upper abdominal survey negative except for some omental adhesions in the upper abdomen.  Frozen section showed grade 1 adenocarcinoma of the endometrium with less than 25% invasion. There were two small areas of hyperpigmentation noted below the urethra at 5 and 7 o'clock that were suspisous for melanotic hyperplasia, and biopsy done to rule out melanoma in situ.  Silver nitrate was used to cauterize the area.  The surgery was difficult due to morbid obesity, which made manipulation of the instruments and exposure difficult and this significantly lengthened the procedure.   PROCEDURE IN DETAIL: After informed consent was obtained, the patient was taken to the operating room where anesthesia was obtained without difficulty. The patient was positioned in the dorsal lithotomy position in [place] and her arms were carefully tucked at her sides and the usual precautions were taken.  She was prepped and draped in normal sterile fashion.  Time-out was performed and a Foley catheter was placed into the bladder and right periuretral biopsy was done in view of the areas of  brown pigmentation.  The cervix was infiltrated with 4 ml of ICG fluorescent dye at 3 an 9 o'clock both superficial and deep injections. A standard VCare uterine manipulator was then placed in the uterus without incident.    An open Hasson technique was used to place an infraumbilical 09-XI baloon trocar under direct visualization. The laparoscope was introduced and CO2 gas was infused for pneumoperitoneum to a pressure of 15 mm Hg.  Right and left lateral 5-mm ports (Airseal on the left) and a 5-12 mm suprapubic port were placed under direct visualization of the laparoscope using an EndoStep technique.  Cytologic washings were obtained.  The patient was placed in Trendelenburg and the bowel was displaced up into the upper abdomen.    Round ligaments were divided on each side with the EndoShears and the retroperitoneal space was opened bilaterally.  The ureters were identified and preserved.  At this point the retroperitoneal spaces were developed and the lymphatic channels were attempted to be mapped to each side.  Due to her morbid obesity, mapping was not successful.   The infundibulopelvic ligaments were skeletonized, sealed and divided with the LigaSure device with care to preserve the ureters.  A bladder flap was created and the bladder was dissected down off the lower uterine segment and cervix using endoshears and electrocautery.  This was difficult due to scarring and adipose tissue.  The uterine arteries were skeletonized bilaterally, sealed and divided with the LigaSure device.  A colpotomy was performed circumferentially along the V-Care ring with electrocautery and the cervix was incised from the vagina and the specimen was removed through the vagina.  A pneumo balloon was placed in the vagina and the vaginal cuff was then  closed in a running continuous fashion using the EndoStitch technique with 0 V-Lock suture with careful attention to include the vaginal cuff angles and the vaginal mucosa  within the closure.  Intraoperative pathologic evaluation revealed favorable risk criteria and therefore further dissection was not performed.  The pelvis was irrigated.   Hemostasis was observed. The intraperitoneal pressure was dropped, and all planes of dissection, vascular pedicles and the vaginal cuff were found to be hemostatic.  The urine was clear.  The suprapubic trocar was removed and the fascia was closed with 0 Vicryl suture using the Endoclose technique. The lateral trocars were removed under visualization.   Before the umbilical trocar was removed the CO2 gas was released.  The fascia there was closed with 0 Vicryl suture in interrupted figure of eight technique.   The skin incisions were closed with subcuticular stitches and glue. The patient tolerated the procedure well.  Sponge, lap and needle counts were correct x2.  The patient was taken to recovery room in excellent condition.  Antibiotics: Ancef 2 gm  VTE prophylaxis: IPCs and SQ heparin.  Mellody Drown, MD

## 2020-05-22 NOTE — Anesthesia Postprocedure Evaluation (Signed)
Anesthesia Post Note  Patient: Allison Dixon  Procedure(s) Performed: TOTAL LAPAROSCOPIC HYSTERECTOMY WITH BILATERAL SALPINGO OOPHORECTOMY AND WASHINGS, PERINEAL BIOPSY (Bilateral ) INDOCYANINE GREEN FLUORESCENCE IMAGING (ICG) (N/A )  Patient location during evaluation: PACU Anesthesia Type: General Level of consciousness: awake and alert and oriented Pain management: pain level controlled Vital Signs Assessment: post-procedure vital signs reviewed and stable Respiratory status: spontaneous breathing, nonlabored ventilation and respiratory function stable Cardiovascular status: blood pressure returned to baseline and stable Postop Assessment: no signs of nausea or vomiting Anesthetic complications: no   No complications documented.   Last Vitals:  Vitals:   05/22/20 1215 05/22/20 1230  BP: (!) 164/97 134/76  Pulse: 78 62  Resp: (!) 24 17  Temp:    SpO2: 100% 100%    Last Pain:  Vitals:   05/22/20 1230  TempSrc:   PainSc: 7                  Allison Dixon

## 2020-05-22 NOTE — Anesthesia Procedure Notes (Signed)
Procedure Name: Intubation Date/Time: 05/22/2020 8:34 AM Performed by: Lerry Liner, CRNA Pre-anesthesia Checklist: Patient identified, Emergency Drugs available, Suction available and Patient being monitored Patient Re-evaluated:Patient Re-evaluated prior to induction Oxygen Delivery Method: Circle system utilized Preoxygenation: Pre-oxygenation with 100% oxygen Induction Type: IV induction Ventilation: Mask ventilation without difficulty Laryngoscope Size: McGraph and 3 Grade View: Grade I Tube type: Oral Tube size: 7.0 mm Number of attempts: 1 Airway Equipment and Method: Stylet and Oral airway Placement Confirmation: ETT inserted through vocal cords under direct vision,  positive ETCO2 and breath sounds checked- equal and bilateral Secured at: 22 cm Tube secured with: Tape Dental Injury: Teeth and Oropharynx as per pre-operative assessment

## 2020-05-22 NOTE — Transfer of Care (Signed)
Immediate Anesthesia Transfer of Care Note  Patient: Allison Dixon  Procedure(s) Performed: TOTAL LAPAROSCOPIC HYSTERECTOMY WITH BILATERAL SALPINGO OOPHORECTOMY AND WASHINGS, PERINEAL BIOPSY (Bilateral ) INDOCYANINE GREEN FLUORESCENCE IMAGING (ICG) (N/A )  Patient Location: PACU  Anesthesia Type:General  Level of Consciousness: drowsy  Airway & Oxygen Therapy: Patient Spontanous Breathing and Patient connected to face mask oxygen  Post-op Assessment: Report given to RN  Post vital signs: stable  Last Vitals:  Vitals Value Taken Time  BP 152/97 05/22/20 1200  Temp 36.2 C 05/22/20 1200  Pulse 69 05/22/20 1204  Resp 18 05/22/20 1204  SpO2 100 % 05/22/20 1204  Vitals shown include unvalidated device data.  Last Pain:  Vitals:   05/22/20 0703  TempSrc: Oral  PainSc: 0-No pain         Complications: No complications documented.

## 2020-05-22 NOTE — Anesthesia Preprocedure Evaluation (Signed)
Anesthesia Evaluation  Patient identified by MRN, date of birth, ID band Patient awake    Reviewed: Allergy & Precautions, NPO status , Patient's Chart, lab work & pertinent test results  History of Anesthesia Complications Negative for: history of anesthetic complications  Airway Mallampati: III  TM Distance: >3 FB Neck ROM: Full    Dental no notable dental hx.    Pulmonary sleep apnea (diagnosed, but hasn't gotten CPAP yet) , neg COPD,    breath sounds clear to auscultation- rhonchi (-) wheezing      Cardiovascular Exercise Tolerance: Good hypertension, (-) CAD, (-) Past MI, (-) Cardiac Stents and (-) CABG  Rhythm:Regular Rate:Normal - Systolic murmurs and - Diastolic murmurs    Neuro/Psych neg Seizures negative neurological ROS  negative psych ROS   GI/Hepatic negative GI ROS, Neg liver ROS,   Endo/Other  negative endocrine ROSneg diabetes  Renal/GU negative Renal ROS     Musculoskeletal negative musculoskeletal ROS (+)   Abdominal (+) + obese,   Peds  Hematology negative hematology ROS (+)   Anesthesia Other Findings Past Medical History: No date: Hypertension No date: Sleep apnea     Comment:  was diagnosed but has not received the cpap machine    Reproductive/Obstetrics                             Anesthesia Physical Anesthesia Plan  ASA: II  Anesthesia Plan: General   Post-op Pain Management:    Induction: Intravenous  PONV Risk Score and Plan: 2 and Ondansetron, Dexamethasone and Midazolam  Airway Management Planned: Oral ETT  Additional Equipment:   Intra-op Plan:   Post-operative Plan: Extubation in OR  Informed Consent: I have reviewed the patients History and Physical, chart, labs and discussed the procedure including the risks, benefits and alternatives for the proposed anesthesia with the patient or authorized representative who has indicated his/her  understanding and acceptance.     Dental advisory given  Plan Discussed with: CRNA and Anesthesiologist  Anesthesia Plan Comments:         Anesthesia Quick Evaluation

## 2020-05-23 ENCOUNTER — Telehealth: Payer: Self-pay

## 2020-05-23 ENCOUNTER — Encounter: Payer: Self-pay | Admitting: Obstetrics and Gynecology

## 2020-05-23 NOTE — Telephone Encounter (Signed)
   1. How well has your pain been managed at home?   1- worst 2 3 4  5- Best  Pt states it's a 4. Sometimes it gets bad.     2. Do you have any concerns about your surgical site or dressing?   No    3. Can you tell me when your follow-up appointment is?   Pt could not remember the date. Advised her it was 9/23 at 1:45.     4. Do you have any concerns about your home care?   No. Sometimes its hard to go up the steps. Her daughter is there to help her.     5. Are you taking all of your discharge medications?   Yes, except the gas X. She was unable to find 80 mg but did find 120 mg. Advised pt 120mg  is fine. Take as directed on the bottle. Pt voiced understanding.   Advised pt to contact office for any concerns. Patient was appreciative of call.

## 2020-05-27 LAB — CYTOLOGY - NON PAP

## 2020-05-30 ENCOUNTER — Other Ambulatory Visit: Payer: Self-pay

## 2020-05-30 ENCOUNTER — Ambulatory Visit (INDEPENDENT_AMBULATORY_CARE_PROVIDER_SITE_OTHER): Payer: Medicaid Other | Admitting: Obstetrics and Gynecology

## 2020-05-30 ENCOUNTER — Encounter: Payer: Self-pay | Admitting: Obstetrics and Gynecology

## 2020-05-30 VITALS — BP 124/78 | HR 84 | Ht 63.0 in | Wt 237.4 lb

## 2020-05-30 DIAGNOSIS — Z4889 Encounter for other specified surgical aftercare: Secondary | ICD-10-CM

## 2020-05-30 DIAGNOSIS — C541 Malignant neoplasm of endometrium: Secondary | ICD-10-CM

## 2020-05-30 DIAGNOSIS — Z90710 Acquired absence of both cervix and uterus: Secondary | ICD-10-CM

## 2020-05-30 NOTE — Progress Notes (Signed)
Pt present for wound check. Pt stated that she was doing well no problems.

## 2020-05-30 NOTE — Progress Notes (Signed)
    OBSTETRICS/GYNECOLOGY POST-OPERATIVE CLINIC VISIT  Subjective:     TIMERA WINDT is a 59 y.o. female who presents to the clinic 1 weeks status post total laparoscopic hysterectomy with bilateral salpingo-oophorectomy and sentinel lymph node mapping for endometrial cancer. Also with vaginal biopsy for hyperpigmentation near urethral area, concerning for possible malignancy. Eating a regular diet without difficulty. Bowel movements are abnormal with occasional constipation. The patient is not having any pain.  The following portions of the patient's history were reviewed and updated as appropriate: allergies, current medications, past family history, past medical history, past social history, past surgical history and problem list.  Review of Systems Pertinent items noted in HPI and remainder of comprehensive ROS otherwise negative.    Objective:    BP 124/78   Pulse 84   Ht 5\' 3"  (1.6 m)   Wt 237 lb 6.4 oz (107.7 kg)   LMP  (LMP Unknown)   BMI 42.05 kg/m  General:  alert and no distress  Abdomen: soft, bowel sounds active, non-tender  Incision:   healing well, no drainage, no erythema, no hernia, no seroma, no swelling, no dehiscence, incision well approximated    Pathology:   DIAGNOSIS:  A. UTERUS WITH CERVIX, BILATERAL FALLOPIAN TUBES AND OVARIES; TOTAL  HYSTERECTOMY WITH BILATERAL SALPINGO-OOPHORECTOMY:  - ENDOMETRIAL CARCINOMA.  - SEE CANCER STAGING SUMMARY BELOW.  - INACTIVE BACKGROUND ENDOMETRIUM WITH 2.3 CM ENDOMETRIAL POLYP WITH  ENDOMETRIAL INTRAEPITHELIAL NEOPLASIA.  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  - MYOMETRIUM WITH ADENOMYOSIS.  - BILATERAL OVARIES, NEGATIVE FOR MALIGNANCY.  - BILATERAL FALLOPIAN TUBES, NEGATIVE FOR MALIGNANCY.    B. PERI URETHRA, RIGHT; BIOPSY:  - LENTIGO SIMPLEX.  - HYPERKERATOSIS.  - NEGATIVE FOR DYSPLASIA AND MALIGNANCY.  - DEEPER SECTIONS AND S100 STAIN EXAMINED.  Assessment:     Doing well postoperatively.  S/p TLH/BSO, sentinel lymph node mapping for endometrial cancer. Also with vaginal biopsy for hyperpigmentation near urethral area,   Plan:   1. Continue any current medications as needed. 2. Wound care discussed. 3. Operative findings again reviewed. Pathology report discussed. 4. Activity restrictions: no bending, stooping, or squatting, no lifting more than 10-15 pounds and pelvic rest for 5 weeks 5. Anticipated return to work: 5 weeks if applicable. 6. Follow up: 5 weeks for final post-op visit with GYN Oncology.    Rubie Maid, MD [facility]

## 2020-06-07 ENCOUNTER — Encounter: Payer: Self-pay | Admitting: Obstetrics and Gynecology

## 2020-06-07 LAB — SURGICAL PATHOLOGY

## 2020-06-26 ENCOUNTER — Other Ambulatory Visit: Payer: Self-pay

## 2020-06-26 ENCOUNTER — Inpatient Hospital Stay: Payer: Medicaid Other | Attending: Obstetrics and Gynecology | Admitting: Obstetrics and Gynecology

## 2020-06-26 VITALS — BP 138/80 | HR 83 | Temp 98.0°F | Resp 20 | Wt 240.8 lb

## 2020-06-26 DIAGNOSIS — Z90710 Acquired absence of both cervix and uterus: Secondary | ICD-10-CM

## 2020-06-26 DIAGNOSIS — Z90722 Acquired absence of ovaries, bilateral: Secondary | ICD-10-CM

## 2020-06-26 DIAGNOSIS — C541 Malignant neoplasm of endometrium: Secondary | ICD-10-CM

## 2020-06-26 NOTE — Progress Notes (Signed)
Gynecologic Oncology Interval Visit   Referring Provider: Diona Fanti, CNM [facility] [address] [place],  [place] [address] [phone]  Chief Concern: endometrial cancer  Subjective:  Allison Dixon is a 59 y.o. G29P7 female who is seen in consultation from Ms. Lawhorn for endometrial cancer.  She underwent TLH BSO with bilateral pelvic SLN mapping and right periurethral biopsy with Dr. Fransisca Connors and Dr. Marcelline Mates on 05/22/20 at [facility]. SLN not biopsied due to no mapping because of morbid obesity.Periurethral biopsy for mucosal pigmentation negative.   DIAGNOSIS:  A. UTERUS WITH CERVIX, BILATERAL FALLOPIAN TUBES AND OVARIES; TOTAL  HYSTERECTOMY WITH BILATERAL SALPINGO-OOPHORECTOMY:  - ENDOMETRIAL CARCINOMA.  - SEE CANCER STAGING SUMMARY BELOW.  - INACTIVE BACKGROUND ENDOMETRIUM WITH 2.3 CM ENDOMETRIAL POLYP WITH  ENDOMETRIAL INTRAEPITHELIAL NEOPLASIA.  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  - MYOMETRIUM WITH ADENOMYOSIS.  - BILATERAL OVARIES, NEGATIVE FOR MALIGNANCY.  - BILATERAL FALLOPIAN TUBES, NEGATIVE FOR MALIGNANCY.   B. PERI URETHRA, RIGHT; BIOPSY:  - LENTIGO SIMPLEX.  - HYPERKERATOSIS.  - NEGATIVE FOR DYSPLASIA AND MALIGNANCY.  - DEEPER SECTIONS AND S100 STAIN EXAMINED.   CANCER CASE SUMMARY: ENDOMETRIUM  Procedure: Total hysterectomy and bilateral salpingo-oophorectomy  Tumor Size: Greatest dimension: 4 cm  Histologic Type: Endometrioid carcinoma  Histologic Grade: FIGO grade 1  Myometrial Invasion: Present    Depth of invasion: 5 mm    Myometrial thickness: 25 mm    Percentage of myometrial invasion: 20%  Uterine Serosa Involvement: Not identified  Cervical Stromal Involvement: Not identified  Other Tissue/Organ Involvement: Not applicable  Lymphovascular Invasion: Not identified  Regional Lymph Nodes: No lymph nodes submitted or found  Pathologic Stage Classification (pTNM, AJCC 8th Edition):  pT1a pN not  assigned / FIGO IA   Immunohistochemistry (IHC) Testing for DNA Mismatch Repair (MMR)  Proteins:  Results:  MLH1: Loss of protein expression  MSH2: Intact nuclear expression  MSH6: Intact nuclear expression, heterogenous pattern  PMS2: Loss of protein expression   IHC Interpretation: Abnormal result with absent nuclear staining for MLH1 and PMS2, high probability of MSI-H.  This pattern of deficient DNA repair proteins is most often associated with methylation of the MLH1 gene promoter. Testing for MLH1 promoter methylation has been requested and the result will be reported in an addendum. Of note, a heterogeneous pattern of MSH-6 staining typically does not indicate germline mutation in MSH6 but is commonly associated with abnormality in another MMR gene such as MLH1 or PMS2, or even other DNA repair genes such as DNA polymerase.  MLH1 promoter methylation positive  Gynecologic Oncology: She presented with abnormal bleeding, irregular x 6-7 months. Her evaluation included the following tests:   Pelvic US 04/18/2020 The uterus is anteverted and measures 10.2 x 5.4. Echo texture is heterogenous with evidence of focal masses. Within the uterus are multiple suspected fibroids measuring: Fibroid 1:Posterior IM 2.6 x 3.1 x 3.0 cm Fibroid 2:Anterior IM 2.2 x 2.2 x 1.8 cm The Endometrium measures 14 mm. Right Ovary was not seen Left Ovary was not seen Survey of the adnexa demonstrates no adnexal masses. There is no free fluid in the cul de sac.  Endometrial biopsy 04/18/2020  A. 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   Problem List: Patient Active Problem List   Diagnosis Date Noted  . S/P laparoscopic hysterectomy 05/30/2020  .  Endometrial cancer ([place])   . Moderate obstructive sleep apnea 05/07/2020  . Dyspnea on exertion  05/07/2020  . Abnormal uterine bleeding 04/18/2020  Ectopic Pregnant  Past Medical History: Past Medical History:  Diagnosis Date  . Hypertension   . Sleep apnea    was diagnosed but has not received the cpap machine     Past Surgical History: Past Surgical History:  Procedure Laterality Date  . DILATION AND CURETTAGE, DIAGNOSTIC / THERAPEUTIC    . EXPLORATORY LAPAROTOMY     for ectopic pregnancy  . TOTAL LAPAROSCOPIC HYSTERECTOMY WITH BILATERAL SALPINGO OOPHORECTOMY Bilateral 05/22/2020   Procedure: TOTAL LAPAROSCOPIC HYSTERECTOMY WITH BILATERAL SALPINGO OOPHORECTOMY AND WASHINGS, PERINEAL BIOPSY;  Surgeon: Mellody Drown, MD;  Location: [facility] ORS;  Service: Gynecology;  Laterality: Bilateral;    Past Gynecologic History:  As per HPI  OB History:  SVD x 7 OB History  Gravida Para Term Preterm AB Living  7 7 0 0 0 0  SAB TAB Ectopic Multiple Live Births  0 0 0 0 0    # Outcome Date GA Lbr Len/2nd Weight Sex Delivery Anes PTL Lv  7 Para           6 Para           5 Para           4 Para           3 Para           2 Para           1 Para             Obstetric Comments  NSVD x 7    Family History: Family History  Problem Relation Age of Onset  . Heart attack Mother   . Cancer Mother   . Cancer Father     Social History: Social History   Socioeconomic History  . Marital status: Married    Spouse name: Not on file  . Number of children: Not on file  . Years of education: Not on file  . Highest education level: Not on file  Occupational History  . Not on file  Tobacco Use  . Smoking status: Never Smoker  . Smokeless tobacco: Never Used  Vaping Use  . Vaping Use: Never used  Substance and Sexual Activity  . Alcohol use: Never  . Drug use: Never  . Sexual activity: Not Currently  Other Topics Concern  . Not on file  Social History Narrative  . Not on file   Social Determinants of Health   Financial Resource Strain:   . Difficulty of Paying  Living Expenses: Not on file  Food Insecurity:   . Worried About Charity fundraiser in the Last Year: Not on file  . Ran Out of Food in the Last Year: Not on file  Transportation Needs:   . Lack of Transportation (Medical): Not on file  . Lack of Transportation (Non-Medical): Not on file  Physical Activity:   . Days of Exercise per Week: Not on file  . Minutes of Exercise per Session: Not on file  Stress:   . Feeling of Stress : Not on file  Social Connections:   . Frequency of Communication with Friends and Family: Not on file  . Frequency of Social Gatherings with Friends and Family: Not on file  . Attends Religious Services: Not on file  . Active Member of Clubs or Organizations: Not on file  . Attends Club or  Organization Meetings: Not on file  . Marital Status: Not on file  Intimate Partner Violence:   . Fear of Current or Ex-Partner: Not on file  . Emotionally Abused: Not on file  . Physically Abused: Not on file  . Sexually Abused: Not on file   Allergies: No Known Allergies  Current Medications: Current Outpatient Medications  Medication Sig Dispense Refill  . docusate sodium (COLACE) 100 MG capsule Take 1 capsule (100 mg total) by mouth 2 (two) times daily as needed. 30 capsule 2  . ibuprofen (ADVIL) 800 MG tablet Take 1 tablet (800 mg total) by mouth every 8 (eight) hours as needed for mild pain. 60 tablet 1  . acetaminophen (TYLENOL) 500 MG tablet Take 500 mg by mouth every 6 (six) hours as needed (pain).  (Patient not taking: Reported on 06/25/2020)    . oxyCODONE-acetaminophen (PERCOCET) 5-325 MG tablet Take 1 tablet by mouth every 4 (four) hours as needed for severe pain. (Patient not taking: Reported on 06/25/2020) 20 tablet 0  . simethicone (GAS-X) 80 MG chewable tablet Chew 1 tablet (80 mg total) by mouth 4 (four) times daily as needed for flatulence (bloating). (Patient not taking: Reported on 06/25/2020) 60 tablet 1   No current facility-administered  medications for this visit.   Review of Systems General:  no complaints Skin: no complaints Eyes: no complaints HEENT: no complaints Breasts: no complaints Pulmonary: no complaints Cardiac: no complaints Gastrointestinal: no complaints Genitourinary/Sexual: no complaints Ob/Gyn: no complaints Musculoskeletal: no complaints Hematology: no complaints Neurologic/Psych: no complaints   Objective:  Physical Examination:  LMP  (LMP Unknown)    ECOG Performance Status: 1 - Symptomatic but completely ambulatory  GENERAL: Patient is a well appearing female in no acute distress HEENT:  Sclera clear. Anicteric NODES:  Negative axillary, supraclavicular, inguinal lymph node survery LUNGS:  Clear to auscultation bilaterally.   HEART:  Regular rate and rhythm.  ABDOMEN:  Soft, nontender.  No hernias, incisions well healed. No masses or ascites EXTREMITIES:  No peripheral edema. Atraumatic. No cyanosis SKIN:  Clear with no obvious rashes or skin changes.  NEURO:  Nonfocal. Well oriented.  Appropriate affect.  Pelvic: chaperoned by RN EGBUS: no lesions Vagina: no lesions, cuff healing well. Adnexa: no palpable masses  Lab Review Lab Results  Component Value Date   WBC 7.5 04/12/2020   HGB 13.0 04/12/2020   HCT 39.6 04/12/2020   MCV 87 04/12/2020   PLT 246 04/12/2020    Radiologic Imaging: CXR 01/25/2020 CLINICAL DATA:  Shortness of breath  EXAM: CHEST - 2 VIEW  COMPARISON:  July 28, 2019  FINDINGS: Lungs are clear. Heart size and pulmonary vascular normal. No evident adenopathy. No bone lesions.  IMPRESSION: No abnormality noted.  EKG 01/26/2020 Nonspecific intraventicular block; Normal sinus rhythm  Assessment:  Allison Dixon is a 59 y.o. female diagnosed with stage IA grade 1 endometrial cancer.  She underwent TLH BSO with bilateral pelvic SLN mapping and right periurethral biopsy with Dr. Fransisca Connors and Dr. Marcelline Mates on 05/22/20 with invasion 5/25 mm and  negative LVSI, adnexa, cervix and washings. SLN not biopsied due to no mapping because of morbid obesity. Periurethral biopsy negative.   Cancer has loss of MLH1 and PMS2 and MLH1 promoter methylation assay positive. No need for Lynch Syndrome testing.   Normal post op exam.   History of exploratory laparotomy for ectopic pregnancy.    Medical co-morbidities complicating care: morbid obesity (MBI 42), HTN and prior abdominal surgery.  Plan:  Problem List Items Addressed This Visit      Genitourinary   Endometrial cancer ([place]) - Primary      Suggested return to clinic in 6 months for surveillance and then alternate follow up with Dr Marcelline Mates.  Instructed her to return sooner if any concerning symptoms.   Verlon Au, NP  I personally interviewed and examined the patient. Agreed with the above/below plan of care. I have directly contributed to assessment and plan of care of this patient and educated and discussed with patient and family.  Mellody Drown, MD

## 2020-08-27 ENCOUNTER — Encounter: Payer: Self-pay | Admitting: Emergency Medicine

## 2020-08-27 ENCOUNTER — Other Ambulatory Visit: Payer: Self-pay

## 2020-08-27 ENCOUNTER — Emergency Department: Payer: Medicaid Other

## 2020-08-27 ENCOUNTER — Emergency Department
Admission: EM | Admit: 2020-08-27 | Discharge: 2020-08-27 | Disposition: A | Discharge status: Home / Self Care | Payer: Medicaid Other | Attending: Emergency Medicine | Admitting: Emergency Medicine

## 2020-08-27 DIAGNOSIS — R000 Tachycardia, unspecified: Secondary | ICD-10-CM | POA: Diagnosis not present

## 2020-08-27 DIAGNOSIS — I10 Essential (primary) hypertension: Secondary | ICD-10-CM | POA: Insufficient documentation

## 2020-08-27 DIAGNOSIS — U071 COVID-19: Secondary | ICD-10-CM | POA: Diagnosis not present

## 2020-08-27 DIAGNOSIS — B349 Viral infection, unspecified: Secondary | ICD-10-CM | POA: Diagnosis not present

## 2020-08-27 DIAGNOSIS — R519 Headache, unspecified: Secondary | ICD-10-CM | POA: Diagnosis present

## 2020-08-27 LAB — CBC WITH DIFFERENTIAL/PLATELET
Abs Immature Granulocytes: 0.02 10*3/uL (ref 0.00–0.07)
Basophils Absolute: 0 10*3/uL (ref 0.0–0.1)
Basophils Relative: 1 %
Eosinophils Absolute: 0.1 10*3/uL (ref 0.0–0.5)
Eosinophils Relative: 1 %
HCT: 42.2 % (ref 36.0–46.0)
Hemoglobin: 13.6 g/dL (ref 12.0–15.0)
Immature Granulocytes: 0 %
Lymphocytes Relative: 9 %
Lymphs Abs: 0.7 10*3/uL (ref 0.7–4.0)
MCH: 27.9 pg (ref 26.0–34.0)
MCHC: 32.2 g/dL (ref 30.0–36.0)
MCV: 86.5 fL (ref 80.0–100.0)
Monocytes Absolute: 0.8 10*3/uL (ref 0.1–1.0)
Monocytes Relative: 10 %
Neutro Abs: 6.2 10*3/uL (ref 1.7–7.7)
Neutrophils Relative %: 79 %
Platelets: 231 10*3/uL (ref 150–400)
RBC: 4.88 MIL/uL (ref 3.87–5.11)
RDW: 13.2 % (ref 11.5–15.5)
WBC: 7.8 10*3/uL (ref 4.0–10.5)
nRBC: 0 % (ref 0.0–0.2)

## 2020-08-27 LAB — RESP PANEL BY RT-PCR (FLU A&B, COVID) ARPGX2
Influenza A by PCR: NEGATIVE
Influenza B by PCR: NEGATIVE
SARS Coronavirus 2 by RT PCR: POSITIVE — AB

## 2020-08-27 LAB — URINALYSIS, COMPLETE (UACMP) WITH MICROSCOPIC
Bacteria, UA: NONE SEEN
Bilirubin Urine: NEGATIVE
Glucose, UA: NEGATIVE mg/dL
Ketones, ur: NEGATIVE mg/dL
Leukocytes,Ua: NEGATIVE
Nitrite: NEGATIVE
Protein, ur: NEGATIVE mg/dL
Specific Gravity, Urine: 1.011 (ref 1.005–1.030)
pH: 7 (ref 5.0–8.0)

## 2020-08-27 LAB — COMPREHENSIVE METABOLIC PANEL
ALT: 12 U/L (ref 0–44)
AST: 17 U/L (ref 15–41)
Albumin: 3.7 g/dL (ref 3.5–5.0)
Alkaline Phosphatase: 73 U/L (ref 38–126)
Anion gap: 10 (ref 5–15)
BUN: 12 mg/dL (ref 6–20)
CO2: 24 mmol/L (ref 22–32)
Calcium: 9.4 mg/dL (ref 8.9–10.3)
Chloride: 104 mmol/L (ref 98–111)
Creatinine, Ser: 0.82 mg/dL (ref 0.44–1.00)
GFR, Estimated: >60 mL/min (ref >60)
Glucose, Bld: 102 mg/dL — ABNORMAL HIGH (ref 70–99)
Potassium: 3.7 mmol/L (ref 3.5–5.1)
Sodium: 138 mmol/L (ref 135–145)
Total Bilirubin: 0.9 mg/dL (ref 0.3–1.2)
Total Protein: 8.7 g/dL — ABNORMAL HIGH (ref 6.5–8.1)

## 2020-08-27 LAB — PROTIME-INR
INR: 1.1 (ref 0.8–1.2)
Prothrombin Time: 14.1 seconds (ref 11.4–15.2)

## 2020-08-27 LAB — LACTIC ACID, PLASMA
Lactic Acid, Venous: 1 mmol/L (ref 0.5–1.9)
Lactic Acid, Venous: 0.8 mmol/L (ref 0.5–1.9)

## 2020-08-27 LAB — TSH: TSH: 0.588 u[IU]/mL (ref 0.350–4.500)

## 2020-08-27 LAB — T4, FREE: Free T4: 0.96 ng/dL (ref 0.61–1.12)

## 2020-08-27 LAB — PROCALCITONIN: Procalcitonin: <0.1 ng/mL

## 2020-08-27 MED ORDER — NAPROXEN 500 MG PO TBEC: 500.0000 mg | DELAYED_RELEASE_TABLET | Freq: Two times a day (BID) | ORAL | 0 refills | Status: AC

## 2020-08-27 MED ORDER — ACETAMINOPHEN 500 MG PO TABS
ORAL_TABLET | ORAL | Status: AC
  Filled 2020-08-27: qty 2

## 2020-08-27 MED ORDER — ONDANSETRON 4 MG PO TBDP: 4.0000 mg | ORAL_TABLET | Freq: Three times a day (TID) | ORAL | 0 refills | Status: DC | PRN

## 2020-08-27 MED ORDER — KETOROLAC TROMETHAMINE 30 MG/ML IJ SOLN
15.0000 mg | INTRAMUSCULAR | Status: AC
  Administered 2020-08-27: 18:00:00 15 mg via INTRAVENOUS
  Filled 2020-08-27: qty 1

## 2020-08-27 MED ORDER — ACETAMINOPHEN 500 MG PO TABS
1000.0000 mg | ORAL_TABLET | Freq: Once | ORAL | Status: AC
  Administered 2020-08-27: 16:00:00 1000 mg via ORAL

## 2020-08-27 MED ORDER — LACTATED RINGERS IV BOLUS
1000.0000 mL | Freq: Once | INTRAVENOUS | Status: AC
  Administered 2020-08-27: 18:00:00 1000 mL via INTRAVENOUS

## 2020-08-27 MED ORDER — ONDANSETRON HCL 4 MG/2ML IJ SOLN
4.0000 mg | Freq: Once | INTRAMUSCULAR | Status: AC
  Administered 2020-08-27: 18:00:00 4 mg via INTRAVENOUS
  Filled 2020-08-27: qty 2

## 2020-08-27 NOTE — ED Provider Notes (Addendum)
[facility] Emergency Department Provider Note  ____________________________________________  Time seen: Approximately 6:31 PM  I have reviewed the triage vital signs and the nursing notes.   HISTORY  Chief Complaint Multiple Complaints    HPI Allison Dixon is a 59 y.o. female with a history of hypertension and endometrial cancer who comes ED complaining of generalized headache, elevated blood pressure, chills, body aches, fatigue and nonproductive cough for the past 2 days.  She reports normal appetite normal oral intake.  No vomiting or diarrhea.  No dysuria, no chest pain or shortness of breath, no neck pain or stiffness or back pain.  Denies any known sick contacts.  She is unvaccinated against Covid and flu.      Past Medical History:  Diagnosis Date   Cancer ([place]) 2021   Uterine    Hypertension    Sleep apnea    was diagnosed but has not received the cpap machine      Patient Active Problem List   Diagnosis Date Noted   S/P laparoscopic hysterectomy 05/30/2020   Endometrial cancer ([place])    Moderate obstructive sleep apnea 05/07/2020   Dyspnea on exertion 05/07/2020   Abnormal uterine bleeding 04/18/2020     Past Surgical History:  Procedure Laterality Date   DILATION AND CURETTAGE, DIAGNOSTIC / THERAPEUTIC     EXPLORATORY LAPAROTOMY     for ectopic pregnancy   TOTAL LAPAROSCOPIC HYSTERECTOMY WITH BILATERAL SALPINGO OOPHORECTOMY Bilateral 05/22/2020   Procedure: TOTAL LAPAROSCOPIC HYSTERECTOMY WITH BILATERAL SALPINGO OOPHORECTOMY AND WASHINGS, PERINEAL BIOPSY;  Surgeon: Mellody Drown, MD;  Location: [facility] ORS;  Service: Gynecology;  Laterality: Bilateral;     Prior to Admission medications   Medication Sig Start Date End Date Taking? Authorizing Provider  acetaminophen (TYLENOL) 500 MG tablet Take 500 mg by mouth every 6 (six) hours as needed (pain).  Patient not taking: Reported on 06/25/2020    [provider]   docusate sodium (COLACE) 100 MG capsule Take 1 capsule (100 mg total) by mouth 2 (two) times daily as needed. 05/22/20   Rubie Maid, MD  ibuprofen (ADVIL) 800 MG tablet Take 1 tablet (800 mg total) by mouth every 8 (eight) hours as needed for mild pain. 05/22/20   Rubie Maid, MD  naproxen (EC NAPROSYN) 500 MG EC tablet Take 1 tablet (500 mg total) by mouth 2 (two) times daily with a meal for 7 days. 08/27/20 09/03/20  Carrie Mew, MD  ondansetron (ZOFRAN ODT) 4 MG disintegrating tablet Take 1 tablet (4 mg total) by mouth every 8 (eight) hours as needed for nausea or vomiting. 08/27/20   Carrie Mew, MD  oxyCODONE-acetaminophen (PERCOCET) 5-325 MG tablet Take 1 tablet by mouth every 4 (four) hours as needed for severe pain. Patient not taking: Reported on 06/25/2020 05/22/20 05/22/21  Rubie Maid, MD  simethicone (GAS-X) 80 MG chewable tablet Chew 1 tablet (80 mg total) by mouth 4 (four) times daily as needed for flatulence (bloating). Patient not taking: Reported on 06/25/2020 05/22/20 05/22/21  Rubie Maid, MD     Allergies Patient has no known allergies.   Family History  Problem Relation Age of Onset   Heart attack Mother    Cancer Mother    Cancer Father     Social History Social History   Tobacco Use   Smoking status: Never Smoker   Smokeless tobacco: Never Used  Scientific laboratory technician Use: Never used  Substance Use Topics   Alcohol use: Never   Drug use:  Never    Review of Systems  Constitutional:   Positive chills.  ENT:   Positive sore throat. No rhinorrhea. Cardiovascular:   No chest pain or syncope. Respiratory:   No dyspnea positive nonproductive cough. Gastrointestinal:   Negative for abdominal pain, vomiting and diarrhea.  Musculoskeletal:   Negative for focal pain or swelling All other systems reviewed and are negative except as documented above in ROS and HPI.  ____________________________________________   PHYSICAL EXAM:  VITAL  SIGNS: ED Triage Vitals  Enc Vitals Group     BP 08/27/20 1605 (!) 155/102     Pulse Rate 08/27/20 1605 (!) 123     Resp 08/27/20 1605 20     Temp 08/27/20 1605 (!) 102.5 F (39.2 C)     Temp Source 08/27/20 1605 Oral     SpO2 08/27/20 1605 97 %     Weight 08/27/20 1606 234 lb (106.1 kg)     Height 08/27/20 1606 5\' 1"  (1.549 m)     Head Circumference --      Peak Flow --      Pain Score 08/27/20 1606 9     Pain Loc --      Pain Edu? --      Excl. in [place]? --     Vital signs reviewed, nursing assessments reviewed.   Constitutional:   Alert and oriented. Non-toxic appearance. Eyes:   Conjunctivae are normal. EOMI. PERRL. ENT      Head:   Normocephalic and atraumatic.      Nose:   Normal.      Mouth/Throat:   Dry mucous membranes      Neck:   No meningismus. Full ROM. Hematological/Lymphatic/Immunilogical:   No cervical lymphadenopathy. Cardiovascular:   Tachycardia heart rate 110. Symmetric bilateral radial and DP pulses.  No murmurs. Cap refill less than 2 seconds. Respiratory:   Normal respiratory effort without tachypnea/retractions. Breath sounds are clear and equal bilaterally. No wheezes/rales/rhonchi. Gastrointestinal:   Soft and nontender. Non distended. There is no CVA tenderness.  No rebound, rigidity, or guarding. Musculoskeletal:   Normal range of motion in all extremities. No joint effusions.  No lower extremity tenderness.  No edema. Neurologic:   Normal speech and language.  Motor grossly intact. No acute focal neurologic deficits are appreciated.  Skin:    Skin is warm, dry and intact. No rash noted.  No petechiae, purpura, or bullae.  ____________________________________________    LABS (pertinent positives/negatives) (all labs ordered are listed, but only abnormal results are displayed) Labs Reviewed  RESP PANEL BY RT-PCR (FLU A&B, COVID) ARPGX2 - Abnormal; Notable for the following components:      Result Value   SARS Coronavirus 2 by RT PCR POSITIVE  (*)    All other components within normal limits  COMPREHENSIVE METABOLIC PANEL - Abnormal; Notable for the following components:   Glucose, Bld 102 (*)    Total Protein 8.7 (*)    All other components within normal limits  URINALYSIS, COMPLETE (UACMP) WITH MICROSCOPIC - Abnormal; Notable for the following components:   Color, Urine YELLOW (*)    APPearance CLEAR (*)    Hgb urine dipstick SMALL (*)    All other components within normal limits  CULTURE, BLOOD (ROUTINE X 2)  CULTURE, BLOOD (ROUTINE X 2)  LACTIC ACID, PLASMA  LACTIC ACID, PLASMA  CBC WITH DIFFERENTIAL/PLATELET  PROTIME-INR  T4, FREE  TSH  PROCALCITONIN   ____________________________________________   EKG  Interpreted by me Sinus tachycardia rate 123.  Left axis, normal intervals.  Poor R wave progression.  Left bundle branch block.  No acute ischemic changes.  ____________________________________________    [id number]  DG Chest 2 View  Result Date: 08/27/2020 CLINICAL DATA:  Fever, chest pain, headache, hypertension EXAM: CHEST - 2 VIEW COMPARISON:  01/25/2020 FINDINGS: The heart size and mediastinal contours are within normal limits. Both lungs are clear. The visualized skeletal structures are unremarkable. IMPRESSION: No active cardiopulmonary disease. Electronically Signed   By: Randa Ngo M.D.   On: 08/27/2020 17:06    ____________________________________________   PROCEDURES Procedures  ____________________________________________  DIFFERENTIAL DIAGNOSIS   Pneumonia, UTI, COVID-19, flu, dehydration, hyperthyroidism  CLINICAL IMPRESSION / ASSESSMENT AND PLAN / ED COURSE  Medications ordered in the ED: Medications  acetaminophen (TYLENOL) tablet 1,000 mg (1,000 mg Oral Given 08/27/20 1623)  lactated ringers bolus 1,000 mL (1,000 mLs Intravenous New Bag/Given 08/27/20 1732)  ketorolac (TORADOL) 30 MG/ML injection 15 mg (15 mg Intravenous Given 08/27/20 1732)  ondansetron (ZOFRAN) injection  4 mg (4 mg Intravenous Given 08/27/20 1732)    Pertinent labs & imaging results that were available during my care of the patient were reviewed by me and considered in my medical decision making (see chart for details).  Allison Dixon was evaluated in Emergency Department on 08/27/2020 for the symptoms described in the history of present illness. She was evaluated in the context of the global COVID-19 pandemic, which necessitated consideration that the patient might be at risk for infection with the SARS-CoV-2 virus that causes COVID-19. Institutional protocols and algorithms that pertain to the evaluation of patients at risk for COVID-19 are in a state of rapid change based on information released by regulatory bodies including the CDC and federal and state organizations. These policies and algorithms were followed during the patient's care in the ED.   Patient presents with multiple constitutional symptoms, most likely influenza-like illness.  Will check Covid flu swab.  Chest x-ray image viewed by me, appears unremarkable without obvious pleural effusion pneumonia or edema.  Radiology report agrees.  Urinalysis and labs are normal.  She initially has fever and tachycardia, but is nontoxic and not septic.  Doubt meningitis or encephalitis, abdomen is benign.  Highly likely to be viral.  We will add on TSH, give IV fluids Toradol and Zofran for symptom relief.  Clinical Course as of 08/27/20 1851  Tue Aug 27, 2020  1846 Resp Panel by RT-PCR (Flu A&B, Covid) Nasopharyngeal Swab(!) Thyroid studies normal.  Covid positive.  Not hypoxic, tolerating p.o.  Stable for discharge home to continue NSAIDs for fever and pain control, Zofran as needed.  Will refer patient to outpatient monoclonal antibody infusion team. [PS]    Clinical Course User Index [PS] Carrie Mew, MD     ____________________________________________   FINAL CLINICAL IMPRESSION(S) / ED DIAGNOSES    Final diagnoses:   COVID-19 virus infection  Acute viral syndrome     ED Discharge Orders         Ordered    ondansetron (ZOFRAN ODT) 4 MG disintegrating tablet  Every 8 hours PRN        08/27/20 1848    naproxen (EC NAPROSYN) 500 MG EC tablet  2 times daily with meals        08/27/20 1848          Portions of this note were generated with dragon dictation software. Dictation errors may occur despite best attempts at proofreading.   Carrie Mew, MD 08/27/20 [phone]  Carrie Mew, MD 08/27/20 [phone]

## 2020-08-27 NOTE — ED Notes (Signed)
Screener now states pt's mom called and said pt is still here. Pt currently in triage with [facility]

## 2020-08-27 NOTE — ED Notes (Signed)
Pt notified screener that she was leaving.

## 2020-08-27 NOTE — ED Notes (Signed)
Pt assisted to restroom at this time. Ambulates without difficulties.

## 2020-08-27 NOTE — Discharge Instructions (Signed)
Your Covid test is positive.  Your other tests are all reassuring today. Take Naproxen or ibuprofen to control pain and fever.  Take Zofran as needed for nausea and drink lots of fluids to stay well-hydrated.    We have sent your information to the [facility] infusion team.  They will contact you in the next few days to schedule an appointment for monoclonal antibody infusion if you qualify.  If warranted, this medication will help limit the progression of your symptoms to help you recover faster.

## 2020-08-27 NOTE — ED Triage Notes (Signed)
Pt in via POV, primary complaint is of headache and hypertension x one day.  Pt febrile, tachycardic at this time.  Pt also reports chills, generalized pain to bilateral legs and chest pain.  NAD noted at this time.

## 2020-08-27 NOTE — ED Notes (Signed)
Date and time results received: 08/27/20 1841   Test: COVID  Critical Value: positive  Name of Provider Notified: Joni Fears, MD   Orders Received? Or Actions Taken?: ED Provider aware.

## 2020-08-28 ENCOUNTER — Other Ambulatory Visit: Payer: Self-pay | Admitting: Nurse Practitioner

## 2020-08-28 ENCOUNTER — Encounter: Payer: Self-pay | Admitting: Nurse Practitioner

## 2020-08-28 ENCOUNTER — Telehealth: Payer: Self-pay | Admitting: Nurse Practitioner

## 2020-08-28 DIAGNOSIS — Z6841 Body Mass Index (BMI) 40.0 and over, adult: Secondary | ICD-10-CM

## 2020-08-28 DIAGNOSIS — C541 Malignant neoplasm of endometrium: Secondary | ICD-10-CM

## 2020-08-28 DIAGNOSIS — I10 Essential (primary) hypertension: Secondary | ICD-10-CM

## 2020-08-28 DIAGNOSIS — U071 COVID-19: Secondary | ICD-10-CM

## 2020-08-28 DIAGNOSIS — G4733 Obstructive sleep apnea (adult) (pediatric): Secondary | ICD-10-CM

## 2020-08-28 NOTE — Telephone Encounter (Signed)
I connected by phone with Allison Dixon on 08/28/2020 at 12:48 PM to discuss the potential use of a new treatment for mild to moderate COVID-19 viral infection in non-hospitalized patients.  This patient is a 59 y.o. female that meets the FDA criteria for Emergency Use Authorization of COVID monoclonal antibody casirivimab/imdevimab, bamlanivimab/etesevimab, or sotrovimab.  Has a (+) direct SARS-CoV-2 viral test result  Has mild or moderate COVID-19   Is NOT hospitalized due to COVID-19  Is within 10 days of symptom onset  Has at least one of the high risk factor(s) for progression to severe COVID-19 and/or hospitalization as defined in EUA.  Specific high risk criteria : BMI > 25, Immunosuppressive Disease or Treatment, Cardiovascular disease or hypertension and Other high risk medical condition per CDC:  Unvaccinated   Symptom onset: 12/18   I have spoken and communicated the following to the patient or parent/caregiver regarding COVID monoclonal antibody treatment:  1. FDA has authorized the emergency use for the treatment of mild to moderate COVID-19 in adults and pediatric patients with positive results of direct SARS-CoV-2 viral testing who are 8 years of age and older weighing at least 40 kg, and who are at high risk for progressing to severe COVID-19 and/or hospitalization.  2. The significant known and potential risks and benefits of COVID monoclonal antibody, and the extent to which such potential risks and benefits are unknown.  3. Information on available alternative treatments and the risks and benefits of those alternatives, including clinical trials.  4. Patients treated with COVID monoclonal antibody should continue to self-isolate and use infection control measures (e.g., wear mask, isolate, social distance, avoid sharing personal items, clean and disinfect "high touch" surfaces, and frequent handwashing) according to CDC guidelines.   5. The patient or parent/caregiver  has the option to accept or refuse COVID monoclonal antibody treatment.  After reviewing this information with the patient, the patient has agreed to receive one of the available covid 19 monoclonal antibodies and will be provided an appropriate fact sheet prior to infusion. Orma Render, NP 08/28/2020 12:48 PM

## 2020-08-29 ENCOUNTER — Ambulatory Visit (HOSPITAL_COMMUNITY)
Admission: RE | Admit: 2020-08-29 | Discharge: 2020-08-29 | Disposition: A | Discharge status: Home / Self Care | Payer: Medicaid Other | Source: Ambulatory Visit | Attending: Pulmonary Disease | Admitting: Pulmonary Disease

## 2020-08-29 DIAGNOSIS — I10 Essential (primary) hypertension: Secondary | ICD-10-CM | POA: Insufficient documentation

## 2020-08-29 DIAGNOSIS — Z6841 Body Mass Index (BMI) 40.0 and over, adult: Secondary | ICD-10-CM | POA: Diagnosis present

## 2020-08-29 DIAGNOSIS — G4733 Obstructive sleep apnea (adult) (pediatric): Secondary | ICD-10-CM | POA: Diagnosis present

## 2020-08-29 DIAGNOSIS — U071 COVID-19: Secondary | ICD-10-CM | POA: Insufficient documentation

## 2020-08-29 DIAGNOSIS — C541 Malignant neoplasm of endometrium: Secondary | ICD-10-CM | POA: Insufficient documentation

## 2020-08-29 MED ORDER — SODIUM CHLORIDE 0.9 % IV SOLN: Freq: Once | INTRAVENOUS | Status: AC

## 2020-08-29 MED ORDER — METHYLPREDNISOLONE SODIUM SUCC 125 MG IJ SOLR: 125.0000 mg | Freq: Once | INTRAMUSCULAR | Status: DC | PRN

## 2020-08-29 MED ORDER — EPINEPHRINE 0.3 MG/0.3ML IJ SOAJ: 0.3000 mg | Freq: Once | INTRAMUSCULAR | Status: DC | PRN

## 2020-08-29 MED ORDER — ALBUTEROL SULFATE HFA 108 (90 BASE) MCG/ACT IN AERS: 2.0000 | INHALATION_SPRAY | Freq: Once | RESPIRATORY_TRACT | Status: DC | PRN

## 2020-08-29 MED ORDER — FAMOTIDINE IN NACL 20-0.9 MG/50ML-% IV SOLN: 20.0000 mg | Freq: Once | INTRAVENOUS | Status: DC | PRN

## 2020-08-29 MED ORDER — SODIUM CHLORIDE 0.9 % IV SOLN: INTRAVENOUS | Status: DC | PRN

## 2020-08-29 MED ORDER — DIPHENHYDRAMINE HCL 50 MG/ML IJ SOLN: 50.0000 mg | Freq: Once | INTRAMUSCULAR | Status: DC | PRN

## 2020-08-29 NOTE — Discharge Instructions (Signed)
If you have any questions or concerns after the infusion please call the Advanced Practice Provider on call at [phone]. This number is ONLY intended for your use regarding questions or concerns about the infusion post-treatment side-effects.  Please do not provide this number to others for use. For return to work notes please contact your primary care provider.  ° °If someone you know is interested in receiving treatment please have them call the COVID hotline at [phone]. ° ° ° ° ° ° °What types of side effects do monoclonal antibody drugs cause?  °Common side effects ° °In general, the more common side effects caused by monoclonal antibody drugs include: °• Allergic reactions, such as hives or itching °• Flu-like signs and symptoms, including chills, fatigue, fever, and muscle aches and pains °• Nausea, vomiting °• Diarrhea °• Skin rashes °• Low blood pressure ° ° °The CDC is recommending patients who receive monoclonal antibody treatments wait at least 90 days before being vaccinated. ° °Currently, there are no data on the safety and efficacy of mRNA COVID-19 vaccines in persons who received monoclonal antibodies or convalescent plasma as part of COVID-19 treatment. Based on the estimated half-life of such therapies as well as evidence suggesting that reinfection is uncommon in the 90 days after initial infection, vaccination should be deferred for at least 90 days, as a precautionary measure until additional information becomes available, to avoid interference of the antibody treatment with vaccine-induced immune responses. ° ° ° ° ° ° °10 Things You Can Do to Manage Your COVID-19 Symptoms at Home °If you have possible or confirmed COVID-19: °1. Stay home from work and school. And stay away from other public places. If you must go out, avoid using any kind of public transportation, ridesharing, or taxis. °2. Monitor your symptoms carefully. If your symptoms get worse, call your healthcare provider  immediately. °3. Get rest and stay hydrated. °4. If you have a medical appointment, call the healthcare provider ahead of time and tell them that you have or may have COVID-19. °5. For medical emergencies, call 911 and notify the dispatch personnel that you have or may have COVID-19. °6. Cover your cough and sneezes with a tissue or use the inside of your elbow. °7. Wash your hands often with soap and water for at least 20 seconds or clean your hands with an alcohol-based hand sanitizer that contains at least 60% alcohol. °8. As much as possible, stay in a specific room and away from other people in your home. Also, you should use a separate bathroom, if available. If you need to be around other people in or outside of the home, wear a mask. °9. Avoid sharing personal items with other people in your household, like dishes, towels, and bedding. °10. Clean all surfaces that are touched often, like counters, tabletops, and doorknobs. Use household cleaning sprays or wipes according to the label instructions. °cdc.gov/coronavirus °03/08/2019 °This information is not intended to replace advice given to you by your health care provider. Make sure you discuss any questions you have with your health care provider. °Document Revised: 08/10/2019 Document Reviewed: 08/10/2019 °Elsevier Patient Education © 2020 [employer] ° °

## 2020-08-29 NOTE — Progress Notes (Signed)
  Diagnosis: COVID-19  Physician: Dr. Wright  Procedure: Covid Infusion Clinic Med: casirivimab\imdevimab infusion - Provided patient with casirivimab\imdevimab fact sheet for patients, parents and caregivers prior to infusion.  Complications: No immediate complications noted.  Discharge: Discharged home   Aliesha Dolata R Tekila Caillouet 08/29/2020   

## 2020-09-01 LAB — CULTURE, BLOOD (ROUTINE X 2)
Culture: NO GROWTH
Special Requests: ADEQUATE

## 2020-11-22 ENCOUNTER — Ambulatory Visit: Payer: Self-pay | Admitting: Podiatry

## 2020-12-25 ENCOUNTER — Inpatient Hospital Stay: Payer: Medicare Other | Attending: Obstetrics and Gynecology | Admitting: Obstetrics and Gynecology

## 2020-12-25 VITALS — BP 141/86 | HR 79 | Temp 97.6°F | Wt 244.2 lb

## 2020-12-25 DIAGNOSIS — G4733 Obstructive sleep apnea (adult) (pediatric): Secondary | ICD-10-CM | POA: Insufficient documentation

## 2020-12-25 DIAGNOSIS — M545 Low back pain, unspecified: Secondary | ICD-10-CM | POA: Diagnosis not present

## 2020-12-25 DIAGNOSIS — I10 Essential (primary) hypertension: Secondary | ICD-10-CM | POA: Insufficient documentation

## 2020-12-25 DIAGNOSIS — Z90722 Acquired absence of ovaries, bilateral: Secondary | ICD-10-CM | POA: Diagnosis not present

## 2020-12-25 DIAGNOSIS — Z8542 Personal history of malignant neoplasm of other parts of uterus: Secondary | ICD-10-CM | POA: Insufficient documentation

## 2020-12-25 DIAGNOSIS — Z90710 Acquired absence of both cervix and uterus: Secondary | ICD-10-CM | POA: Insufficient documentation

## 2020-12-25 DIAGNOSIS — C541 Malignant neoplasm of endometrium: Secondary | ICD-10-CM

## 2020-12-25 NOTE — Progress Notes (Signed)
Gynecologic Oncology Interval Visit   Referring Provider: Diona Fanti, CNM [facility] [place],  [place] [address] [phone]  Chief Concern: endometrial cancer  Subjective:  Allison Dixon is a 60 y.o. G4P7 female, diagnosed with stage IA grade I endometrial cancer s/p TLH BSO with bilateral pelvic SLN mapping (no biopsies) and right periurethral biopsy (negative) on 05/22/20, who returns to clinic for follow-up.   She was diagnosed with stage IA grade 1 endometrial cancer.  She underwent TLH BSO with bilateral pelvic SLN mapping and right periurethral biopsy with Dr. Fransisca Connors and Dr. Marcelline Mates on 05/22/20 with invasion 5/25 mm and negative LVSI, adnexa, cervix and washings. SLN not biopsied due to no mapping because of morbid obesity. Periurethral biopsy negative. Cancer has loss of MLH1 and PMS2 and MLH1 promoter methylation assay positive. No need for Lynch Syndrome testing.   She complains of low back pain which she's had for many years.   Gynecologic Oncology: Initially seen in consultation from Dani Gobble for consultation for endometrial cancer. She presented with abnormal bleeding, irregular x 6-7 months. Her evaluation included the following tests:   Pelvic US 04/18/2020 The uterus is anteverted and measures 10.2 x 5.4. Echo texture is heterogenous with evidence of focal masses. Within the uterus are multiple suspected fibroids measuring: Fibroid 1:Posterior IM 2.6 x 3.1 x 3.0 cm Fibroid 2:Anterior IM 2.2 x 2.2 x 1.8 cm The Endometrium measures 14 mm. Right Ovary was not seen Left Ovary was not seen Survey of the adnexa demonstrates no adnexal masses. There is no free fluid in the cul de sac.  Endometrial biopsy 04/18/2020  A. 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   DIAGNOSIS:  A. UTERUS WITH CERVIX,  BILATERAL FALLOPIAN TUBES AND OVARIES; TOTAL  HYSTERECTOMY WITH BILATERAL SALPINGO-OOPHORECTOMY:  - ENDOMETRIAL CARCINOMA.  - SEE CANCER STAGING SUMMARY BELOW.  - INACTIVE BACKGROUND ENDOMETRIUM WITH 2.3 CM ENDOMETRIAL POLYP WITH  ENDOMETRIAL INTRAEPITHELIAL NEOPLASIA.  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  - MYOMETRIUM WITH ADENOMYOSIS.  - BILATERAL OVARIES, NEGATIVE FOR MALIGNANCY.  - BILATERAL FALLOPIAN TUBES, NEGATIVE FOR MALIGNANCY.   B. PERI URETHRA, RIGHT; BIOPSY:  - LENTIGO SIMPLEX.  - HYPERKERATOSIS.  - NEGATIVE FOR DYSPLASIA AND MALIGNANCY.  - DEEPER SECTIONS AND S100 STAIN EXAMINED.   CANCER CASE SUMMARY: ENDOMETRIUM  Procedure: Total hysterectomy and bilateral salpingo-oophorectomy  Tumor Size: Greatest dimension: 4 cm  Histologic Type: Endometrioid carcinoma  Histologic Grade: FIGO grade 1  Myometrial Invasion: Present    Depth of invasion: 5 mm    Myometrial thickness: 25 mm    Percentage of myometrial invasion: 20%  Uterine Serosa Involvement: Not identified  Cervical Stromal Involvement: Not identified  Other Tissue/Organ Involvement: Not applicable  Lymphovascular Invasion: Not identified  Regional Lymph Nodes: No lymph nodes submitted or found  Pathologic Stage Classification (pTNM, AJCC 8th Edition): pT1a pN not  assigned / FIGO IA   Immunohistochemistry (IHC) Testing for DNA Mismatch Repair (MMR)  Proteins:  Results:  MLH1: Loss of protein expression  MSH2: Intact nuclear expression  MSH6: Intact nuclear expression, heterogenous pattern  PMS2: Loss of protein expression   IHC Interpretation: Abnormal result with absent nuclear staining for MLH1 and PMS2, high probability of MSI-H.  This pattern of deficient DNA repair proteins is most often associated with methylation of the MLH1  gene promoter. Testing for MLH1 promoter methylation has been requested and the result will be reported in an addendum.  Of note, a heterogeneous pattern of MSH-6 staining typically does not indicate germline mutation in MSH6 but is commonly associated with abnormality in another MMR gene such as MLH1 or PMS2, or even other DNA repair genes such as DNA polymerase.  MLH1 promoter methylation positive  Problem List: Patient Active Problem List   Diagnosis Date Noted  . S/P laparoscopic hysterectomy 05/30/2020  . Endometrial cancer ([place])   . Moderate obstructive sleep apnea 05/07/2020  . Dyspnea on exertion 05/07/2020  . Abnormal uterine bleeding 04/18/2020  Ectopic Pregnant  Past Medical History: Past Medical History:  Diagnosis Date  . Cancer ([place]) 2021   Uterine   . Hypertension   . Sleep apnea    was diagnosed but has not received the cpap machine     Past Surgical History: Past Surgical History:  Procedure Laterality Date  . DILATION AND CURETTAGE, DIAGNOSTIC / THERAPEUTIC    . EXPLORATORY LAPAROTOMY     for ectopic pregnancy  . TOTAL LAPAROSCOPIC HYSTERECTOMY WITH BILATERAL SALPINGO OOPHORECTOMY Bilateral 05/22/2020   Procedure: TOTAL LAPAROSCOPIC HYSTERECTOMY WITH BILATERAL SALPINGO OOPHORECTOMY AND WASHINGS, PERINEAL BIOPSY;  Surgeon: Mellody Drown, MD;  Location: [facility] ORS;  Service: Gynecology;  Laterality: Bilateral;    Past Gynecologic History:  As per HPI  OB History:  SVD x 7 OB History  Gravida Para Term Preterm AB Living  7 7 0 0 0 0  SAB IAB Ectopic Multiple Live Births  0 0 0 0 0    # Outcome Date GA Lbr Len/2nd Weight Sex Delivery Anes PTL Lv  7 Para           6 Para           5 Para           4 Para           3 Para           2 Para           1 Para             Obstetric Comments  NSVD x 7    Family History: Family History  Problem Relation Age of Onset  . Heart attack Mother   . Cancer Mother   . Cancer Father     Social History: Social History   Socioeconomic History  . Marital status: Married    Spouse name: Not on file  . Number of children:  Not on file  . Years of education: Not on file  . Highest education level: Not on file  Occupational History  . Not on file  Tobacco Use  . Smoking status: Never Smoker  . Smokeless tobacco: Never Used  Vaping Use  . Vaping Use: Never used  Substance and Sexual Activity  . Alcohol use: Never  . Drug use: Never  . Sexual activity: Not Currently  Other Topics Concern  . Not on file  Social History Narrative  . Not on file   Social Determinants of Health   Financial Resource Strain: Not on file  Food Insecurity: Not on file  Transportation Needs: Not on file  Physical Activity: Not on file  Stress: Not on file  Social Connections: Not on file  Intimate Partner Violence: Not on file   Allergies: No Known Allergies  Current Medications: Current Outpatient Medications  Medication Sig Dispense Refill  . acetaminophen (TYLENOL)  500 MG tablet Take 500 mg by mouth every 6 (six) hours as needed (pain).  (Patient not taking: No sig reported)    . ibuprofen (ADVIL) 800 MG tablet Take 1 tablet (800 mg total) by mouth every 8 (eight) hours as needed for mild pain. (Patient not taking: Reported on 12/25/2020) 60 tablet 1   No current facility-administered medications for this visit.   Review of Systems General:  fatigue Skin: no complaints Eyes: no complaints HEENT: no complaints Breasts: no complaints Pulmonary: shortness of breath Cardiac: no complaints Gastrointestinal: no complaints Genitourinary/Sexual: no complaints Ob/Gyn: no complaints Musculoskeletal: back pain Hematology: no complaints Neurologic/Psych: no complaints    Objective:  Physical Examination:  BP (!) 141/86   Pulse 79   Temp 97.6 F (36.4 C) (Tympanic)   Wt 244 lb 3.2 oz (110.8 kg)   LMP  (LMP Unknown)   SpO2 100%   BMI 46.14 kg/m    ECOG Performance Status: 1 - Symptomatic but completely ambulatory  GENERAL: Patient is a well appearing female in no acute distress HEENT:  Sclera clear.  Anicteric NODES:  Negative axillary, supraclavicular, inguinal lymph node survery LUNGS:  Clear to auscultation bilaterally.   HEART:  Regular rate and rhythm.  ABDOMEN:  Soft, nontender.  No hernias, incisions well healed. No masses or ascites EXTREMITIES:  No peripheral edema. Atraumatic. No cyanosis SKIN:  Clear with no obvious rashes or skin changes.  NEURO:  Nonfocal. Well oriented.  Appropriate affect.   Pelvic exam: normal external genitalia, vulva, vagina, cervix, uterus and adnexa, VULVA: normal appearing vulva with no masses, tenderness or lesions, VAGINA: normal appearing vagina with normal color and discharge, no lesions, CERVIX: surgically absent, UTERUS: surgically absent, vaginal cuff well healed, BME  nontender and no masses,    Lab Review Lab Results  Component Value Date   WBC 7.8 08/27/2020   HGB 13.6 08/27/2020   HCT 42.2 08/27/2020   MCV 86.5 08/27/2020   PLT 231 08/27/2020    Radiologic Imaging: As per HPI  Assessment:  Allison Dixon is a 60 y.o. female diagnosed with stage IA grade 1 endometrial cancer.  She underwent TLH BSO with bilateral pelvic SLN mapping and right periurethral biopsy with Dr. Fransisca Connors and Dr. Marcelline Mates on 05/22/20 with invasion 5/25 mm and negative LVSI, adnexa, cervix and washings. SLN not biopsied due to no mapping because of morbid obesity. Periurethral biopsy negative. Clincally NED   Cancer has loss of MLH1 and PMS2 and MLH1 promoter methylation assay positive. No need for Lynch Syndrome testing.   History of exploratory laparotomy for ectopic pregnancy.    Medical co-morbidities complicating care: morbid obesity (MBI 42), HTN and prior abdominal surgery.  Plan:   Problem List Items Addressed This Visit      Genitourinary   Endometrial cancer ([place]) - Primary      Follow up with Dr. Marcelline Mates to re-establish well woman care in 48month. Suggested return to clinic in 6 months for surveillance and then alternate follow up with Dr  CMarcelline Mates  Instructed her to return sooner if any concerning symptoms.   LVerlon Au NP   I personally had a face to face interaction and evaluated the patient jointly with the NP, Ms. LBeckey Rutter  I have reviewed her history and available records and have performed the key portions of the physical exam including lymph node survey, abdominal exam, pelvic exam with my findings confirming those documented above by the APP.  I have discussed the case with  the APP and the patient.  I agree with the above documentation, assessment and plan which was fully formulated by me.  Counseling was completed by me.   I personally saw the patient and performed a substantive portion of this encounter in conjunction with the listed APP as documented above.  Waniya Hoglund Gaetana Michaelis, MD

## 2020-12-25 NOTE — Patient Instructions (Signed)
Please contact Dr. Andreas Blower office to see Dr. Marcelline Mates or Dani Gobble, CNM in 3 months (around March 26, 2021). We will see you back in 6 months.

## 2021-01-14 ENCOUNTER — Ambulatory Visit
Admission: RE | Admit: 2021-01-14 | Discharge: 2021-01-14 | Disposition: A | Discharge status: Home / Self Care | Payer: Medicare Other | Attending: Nurse Practitioner | Admitting: Nurse Practitioner

## 2021-01-14 ENCOUNTER — Ambulatory Visit (INDEPENDENT_AMBULATORY_CARE_PROVIDER_SITE_OTHER): Payer: Medicare Other | Admitting: Nurse Practitioner

## 2021-01-14 ENCOUNTER — Other Ambulatory Visit: Payer: Self-pay

## 2021-01-14 ENCOUNTER — Encounter: Payer: Self-pay | Admitting: Nurse Practitioner

## 2021-01-14 ENCOUNTER — Ambulatory Visit
Admission: RE | Admit: 2021-01-14 | Discharge: 2021-01-14 | Disposition: A | Discharge status: Home / Self Care | Payer: Medicare Other | Source: Ambulatory Visit | Attending: Nurse Practitioner | Admitting: Nurse Practitioner

## 2021-01-14 VITALS — BP 138/83 | HR 80 | Temp 97.8°F | Ht 61.18 in | Wt 245.6 lb

## 2021-01-14 DIAGNOSIS — G8929 Other chronic pain: Secondary | ICD-10-CM | POA: Insufficient documentation

## 2021-01-14 DIAGNOSIS — R7303 Prediabetes: Secondary | ICD-10-CM

## 2021-01-14 DIAGNOSIS — M5441 Lumbago with sciatica, right side: Secondary | ICD-10-CM

## 2021-01-14 DIAGNOSIS — I10 Essential (primary) hypertension: Secondary | ICD-10-CM

## 2021-01-14 DIAGNOSIS — M79672 Pain in left foot: Secondary | ICD-10-CM | POA: Insufficient documentation

## 2021-01-14 DIAGNOSIS — R0602 Shortness of breath: Secondary | ICD-10-CM

## 2021-01-14 DIAGNOSIS — C541 Malignant neoplasm of endometrium: Secondary | ICD-10-CM

## 2021-01-14 DIAGNOSIS — Z7689 Persons encountering health services in other specified circumstances: Secondary | ICD-10-CM

## 2021-01-14 LAB — BAYER DCA HB A1C WAIVED: HB A1C (BAYER DCA - WAIVED): 5.6 % (ref <7.0)

## 2021-01-14 NOTE — Progress Notes (Signed)
EKG interpreted by me on 01/14/21 showed normal sinus rhythm with a heart rate of 78 and inverted T waves, changed from prior EKGs

## 2021-01-14 NOTE — Progress Notes (Signed)
New Patient Office Visit  Subjective:  Patient ID: Allison Dixon, female    DOB: 1961/04/02  Age: 60 y.o. MRN: [id number]  CC:  Chief Complaint  Patient presents with  . New Patient (Initial Visit)    Patient has been having back pain, L. Foot pain, and R foot numbness for the past 3 months. Patient also states that she has been short of breath for the past 3 months or more.   . Back Pain  . Shortness of Breath       . Right foot numbness  . Left foot pain  . Hypertension    Patient states that she has been on hypertension medication in the past but took herself off because they were make her sick and made her stomach hurt    HPI Allison Dixon presents presents for new patient visit to establish care.  Introduced to Designer, jewellery role and practice setting.  All questions answered.  Discussed provider/patient relationship and expectations.   BACK PAIN  Duration: months, 3 months  Mechanism of injury: unknown Location: midline and low back Onset: gradual Severity: 8/10 Quality: sharp Frequency: intermittent Radiation: R leg below the knee Aggravating factors: laying Alleviating factors: nothing Status: fluctuating Treatments attempted: rest, heat and aleve  Relief with NSAIDs?: mild Nighttime pain:  yes Paresthesias / decreased sensation:  yes, right Bowel / bladder incontinence:  no Fevers:  no Dysuria / urinary frequency:  no  SHORTNESS OF BREATH  Duration: 3 months, off and on Onset: gradual Description of breathing discomfort: feels like something gets hung in her throat  Severity: varies Episode duration: with exertion, stops with rest Frequency: daily Related to exertion: yes Cough: no Chest tightness: no Wheezing: no Fevers: no Chest pain: no Palpitations: no  Nausea: no Diaphoresis: yes Deconditioning: yes Status: fluctuating Aggravating factors: walking, exertion Alleviating factors: rest Treatments attempted:  none  HYPERTENSION  Hypertension status: stable  Satisfied with current treatment? was on lisinopril, hurt her stomach so she stopped Duration of hypertension: chronic BP monitoring frequency:  not checking BP range:  n/a BP medication side effects:  yes Medication compliance: stopped medicine Previous BP meds:lisinopril Aspirin: no Recurrent headaches: no Visual changes: no Palpitations: no Dyspnea: yes Chest pain: no Lower extremity edema: no Dizzy/lightheaded: no    Past Medical History:  Diagnosis Date  . Cancer ([place]) 2021   Uterine   . Hypertension   . Sleep apnea    was diagnosed but has not received the cpap machine     Past Surgical History:  Procedure Laterality Date  . DILATION AND CURETTAGE, DIAGNOSTIC / THERAPEUTIC    . EXPLORATORY LAPAROTOMY     for ectopic pregnancy  . TOTAL LAPAROSCOPIC HYSTERECTOMY WITH BILATERAL SALPINGO OOPHORECTOMY Bilateral 05/22/2020   Procedure: TOTAL LAPAROSCOPIC HYSTERECTOMY WITH BILATERAL SALPINGO OOPHORECTOMY AND WASHINGS, PERINEAL BIOPSY;  Surgeon: Mellody Drown, MD;  Location: [facility] ORS;  Service: Gynecology;  Laterality: Bilateral;    Family History  Problem Relation Age of Onset  . Heart attack Mother   . Cancer Mother   . Cancer Father     Social History   Socioeconomic History  . Marital status: Widowed    Spouse name: Not on file  . Number of children: Not on file  . Years of education: Not on file  . Highest education level: Not on file  Occupational History  . Not on file  Tobacco Use  . Smoking status: Never Smoker  . Smokeless tobacco: Never Used  Vaping  Use  . Vaping Use: Never used  Substance and Sexual Activity  . Alcohol use: Never  . Drug use: Never  . Sexual activity: Not Currently  Other Topics Concern  . Not on file  Social History Narrative  . Not on file   Social Determinants of Health   Financial Resource Strain: Not on file  Food Insecurity: Not on file  Transportation Needs:  Not on file  Physical Activity: Not on file  Stress: Not on file  Social Connections: Not on file  Intimate Partner Violence: Not on file    ROS Review of Systems  Constitutional: Positive for diaphoresis (intermittent since hysterectomy) and fatigue. Negative for fever.  HENT: Negative.   Eyes: Negative.   Respiratory: Positive for shortness of breath (with exertion). Negative for cough.   Cardiovascular: Negative.   Gastrointestinal: Negative.   Endocrine: Positive for polyuria.  Genitourinary: Positive for frequency. Negative for urgency.  Musculoskeletal: Positive for back pain.       Left foot pain that makes it hard to walk. Started 3 months ago.  Skin: Negative.   Allergic/Immunologic: Negative.   Neurological: Negative.   Hematological: Negative.   Psychiatric/Behavioral: Negative.     Objective:   Today's Vitals: BP 138/83   Pulse 80   Temp 97.8 F (36.6 C) (Oral)   Ht 5' 1.18" (1.554 m)   Wt 245 lb 9.6 oz (111.4 kg)   LMP  (LMP Unknown)   SpO2 99%   BMI 46.13 kg/m   Physical Exam Vitals and nursing note reviewed.  Constitutional:      General: She is not in acute distress.    Appearance: Normal appearance. She is obese.  HENT:     Head: Normocephalic and atraumatic.     Right Ear: Tympanic membrane, ear canal and external ear normal.     Left Ear: Tympanic membrane, ear canal and external ear normal.     Nose: Nose normal.     Mouth/Throat:     Mouth: Mucous membranes are moist.     Pharynx: Oropharynx is clear.  Eyes:     Conjunctiva/sclera: Conjunctivae normal.  Cardiovascular:     Rate and Rhythm: Normal rate and regular rhythm.     Pulses: Normal pulses.     Heart sounds: Normal heart sounds.  Pulmonary:     Effort: Pulmonary effort is normal.     Breath sounds: Normal breath sounds.     Comments: Patient short of breath going from chair to getting on the exam table Abdominal:     General: Bowel sounds are normal.     Palpations: Abdomen  is soft.     Tenderness: There is no abdominal tenderness.  Musculoskeletal:        General: Normal range of motion.     Cervical back: Normal range of motion.     Right lower leg: No edema.     Left lower leg: No edema.     Comments: Positive straight leg raise with right leg  Skin:    General: Skin is warm and dry.  Neurological:     General: No focal deficit present.     Mental Status: She is alert and oriented to person, place, and time.  Psychiatric:        Mood and Affect: Mood normal.        Behavior: Behavior normal.        Thought Content: Thought content normal.        Judgment: Judgment normal.  Assessment & Plan:   Problem List Items Addressed This Visit      Cardiovascular and Mediastinum   Primary hypertension    Chronic, stable. BP today 138/83. Not taking any medication because the lisinopril hurt her stomach. BP today is stable. Encouraged her to check her blood pressure at home and write it down. Discussed DASH diet. Check CMP, CBC today. Follow-up in 4 weeks.      Relevant Orders   Comp Met (CMET)   CBC w/Diff     Nervous and Auditory   Chronic bilateral low back pain with right-sided sciatica    No red flags on exam. Positive right straight leg raise. Can continue tylenol as needed. Will refer to PT. Follow-up in 4 weeks.      Relevant Orders   Ambulatory referral to Physical Therapy     Genitourinary   Endometrial cancer [facility] [address])    Had total hysterectomy 05/22/20. Continue collaboration and recommendations with GYN and oncology.         Other   Shortness of breath - Primary    Shortness of breath with exertion worsening in the last 3 months. Reviewed prior notes and saw that she was worked up with cardiology and pulmonology about a year ago for this same problem. She also was diagnosed with covid-19 in December 2021. Spirometry in office was normal. EKG today showed NSR with t wave inversions. Will place urgent referral to cardiology. Follow-up  in 4 weeks or sooner with worsening symptoms. Go to the ER if start having chest pain, severe shortness of breath, or pre-syncope/syncope.       Relevant Orders   EKG 12-Lead (Completed)   Spirometry with Graph (Completed)   CBC w/Diff   B Nat Peptide   Ambulatory referral to Cardiology   Prediabetes    A1C today is 5.6. Continue watching diet and encouraged weight loss. Check lipid panel today. Follow-up in 6 months.       Relevant Orders   Comp Met (CMET)   Lipid Panel w/o Chol/HDL Ratio   Bayer DCA Hb A1c Waived   Left foot pain    Has noted pain in left foot for about 3 months. Was told in the past that she may have a bone spur but never had imaging. Will check x-ray of left foot and treat based on results.       Relevant Orders   DG Foot Complete Left   Morbid obesity ([place])    BMI 46.13. Discussed diet and exercise. Goal is to lose 1-2 pounds per week. Her weight could be impacting her back pain, foot pain, and shortness of breath.        Other Visit Diagnoses    Encounter to establish care          Outpatient Encounter Medications as of 01/14/2021  Medication Sig  . acetaminophen (TYLENOL) 500 MG tablet Take 500 mg by mouth every 6 (six) hours as needed (pain).  Marland Kitchen ibuprofen (ADVIL) 800 MG tablet Take 1 tablet (800 mg total) by mouth every 8 (eight) hours as needed for mild pain.   No facility-administered encounter medications on file as of 01/14/2021.    Follow-up: Return in about 4 weeks (around 02/11/2021) for shortness of breath follow up and foot pain.   Charyl Dancer, NP

## 2021-01-14 NOTE — Assessment & Plan Note (Signed)
Had total hysterectomy 05/22/20. Continue collaboration and recommendations with GYN and oncology.

## 2021-01-14 NOTE — Assessment & Plan Note (Signed)
No red flags on exam. Positive right straight leg raise. Can continue tylenol as needed. Will refer to PT. Follow-up in 4 weeks.

## 2021-01-14 NOTE — Assessment & Plan Note (Signed)
Shortness of breath with exertion worsening in the last 3 months. Reviewed prior notes and saw that she was worked up with cardiology and pulmonology about a year ago for this same problem. She also was diagnosed with covid-19 in December 2021. Spirometry in office was normal. EKG today showed NSR with t wave inversions. Will place urgent referral to cardiology. Follow-up in 4 weeks or sooner with worsening symptoms. Go to the ER if start having chest pain, severe shortness of breath, or pre-syncope/syncope.

## 2021-01-14 NOTE — Assessment & Plan Note (Signed)
Has noted pain in left foot for about 3 months. Was told in the past that she may have a bone spur but never had imaging. Will check x-ray of left foot and treat based on results.

## 2021-01-14 NOTE — Assessment & Plan Note (Signed)
Chronic, stable. BP today 138/83. Not taking any medication because the lisinopril hurt her stomach. BP today is stable. Encouraged her to check her blood pressure at home and write it down. Discussed DASH diet. Check CMP, CBC today. Follow-up in 4 weeks.

## 2021-01-14 NOTE — Assessment & Plan Note (Addendum)
A1C today is 5.6. Continue watching diet and encouraged weight loss. Check lipid panel today. Follow-up in 6 months.

## 2021-01-14 NOTE — Patient Instructions (Signed)
Friday 8am with Dr. [facility] at [facility] [address] [place], [place] [address] [phone]

## 2021-01-14 NOTE — Assessment & Plan Note (Signed)
BMI 46.13. Discussed diet and exercise. Goal is to lose 1-2 pounds per week. Her weight could be impacting her back pain, foot pain, and shortness of breath.

## 2021-01-15 LAB — CBC WITH DIFFERENTIAL/PLATELET
Basophils Absolute: 0 10*3/uL (ref 0.0–0.2)
Basos: 1 %
EOS (ABSOLUTE): 0.2 10*3/uL (ref 0.0–0.4)
Eos: 3 %
Hematocrit: 42 % (ref 34.0–46.6)
Hemoglobin: 13.6 g/dL (ref 11.1–15.9)
Immature Grans (Abs): 0 10*3/uL (ref 0.0–0.1)
Immature Granulocytes: 0 %
Lymphocytes Absolute: 2.2 10*3/uL (ref 0.7–3.1)
Lymphs: 35 %
MCH: 27.7 pg (ref 26.6–33.0)
MCHC: 32.4 g/dL (ref 31.5–35.7)
MCV: 86 fL (ref 79–97)
Monocytes Absolute: 0.5 10*3/uL (ref 0.1–0.9)
Monocytes: 8 %
Neutrophils Absolute: 3.5 10*3/uL (ref 1.4–7.0)
Neutrophils: 53 %
Platelets: 245 10*3/uL (ref 150–450)
RBC: 4.91 x10E6/uL (ref 3.77–5.28)
RDW: 13.1 % (ref 11.7–15.4)
WBC: 6.4 10*3/uL (ref 3.4–10.8)

## 2021-01-15 LAB — LIPID PANEL W/O CHOL/HDL RATIO
Cholesterol, Total: 183 mg/dL (ref 100–199)
HDL: 68 mg/dL (ref >39)
LDL Chol Calc (NIH): 102 mg/dL — ABNORMAL HIGH (ref 0–99)
Triglycerides: 69 mg/dL (ref 0–149)
VLDL Cholesterol Cal: 13 mg/dL (ref 5–40)

## 2021-01-15 LAB — COMPREHENSIVE METABOLIC PANEL
ALT: 10 IU/L (ref 0–32)
AST: 15 IU/L (ref 0–40)
Albumin/Globulin Ratio: 1.1 — ABNORMAL LOW (ref 1.2–2.2)
Albumin: 3.9 g/dL (ref 3.8–4.9)
Alkaline Phosphatase: 93 IU/L (ref 44–121)
BUN/Creatinine Ratio: 19 (ref 9–23)
BUN: 16 mg/dL (ref 6–24)
Bilirubin Total: 0.5 mg/dL (ref 0.0–1.2)
CO2: 25 mmol/L (ref 20–29)
Calcium: 9.3 mg/dL (ref 8.7–10.2)
Chloride: 104 mmol/L (ref 96–106)
Creatinine, Ser: 0.83 mg/dL (ref 0.57–1.00)
Globulin, Total: 3.7 g/dL (ref 1.5–4.5)
Glucose: 97 mg/dL (ref 65–99)
Potassium: 4.3 mmol/L (ref 3.5–5.2)
Sodium: 142 mmol/L (ref 134–144)
Total Protein: 7.6 g/dL (ref 6.0–8.5)
eGFR: 82 mL/min/{1.73_m2} (ref >59)

## 2021-01-15 LAB — BRAIN NATRIURETIC PEPTIDE: BNP: 21.4 pg/mL (ref 0.0–100.0)

## 2021-01-15 NOTE — Progress Notes (Signed)
Cardiology Office Note    Date:  01/17/2021   ID:  Allison Dixon, DOB 1961-06-26, MRN [id number]  PCP:  Vance Peper, NP Cardiologist:  Kate Sable, MD  Electrophysiologist:  None   Chief Complaint: SOB/chest tightness  History of Present Illness:   Allison Dixon is a 60 y.o. female with history of HTN, Covid in 08/2020, sleep apnea, endometrial cancer, and obesity who presents for evaluation of exertional SOB/chest tightness.  She was previously evaluated by Dr. Garen Lah in 08/2019 for atypical chest pain, that had been present for about 1 year.  It was noted that prior ED evaluation was unrevealing.  Due to persistent symptoms, she underwent echo in 11/2019 which showed an EF of 55 to 60%, no regional wall motion abnormalities, normal LV diastolic function parameters, normal RV systolic function and ventricular cavity size, no significant valvular abnormalities, and an estimated right atrial pressure of 3 mmHg.  Lexiscan MPI in 10/2019 showed no significant ischemia and was overall a low risk study.  ABIs in 01/2020 were normal bilaterally.  PFTs in 02/2020 showed no evidence of obstructive or reactive disease.  She was seen by PCP's office on 01/14/2021 reporting a 27-month history of intermittent shortness of breath that "feels like something got hung in her throat."  Dyspnea was worse with exertion and improved with rest.  EKG performed in their office showed sinus rhythm with lateral T wave inversion, which had previously been noted on tracings dating back at least to 10/06/2019, though did appear to be more pronounced on a tracing at PCP's office.  BNP 21.4.  Cardiology follow-up was advised.  She comes in today noting a 96-month history of progressive exertional shortness of breath with intermittent chest tightness at times.  She indicates that she has difficulty ambulating from her car to in her house secondary to dyspnea.  She can stop and rest and usually within about 5 minutes  symptoms resolved.  She does note some intermittent chest pressure with exertion that does not radiate.  No palpitations, dizziness, presyncope, or syncope.  Currently symptom-free.   Labs independently reviewed: 01/2021 - TC 183, TG 69, HDL 60, LDL 102, Hgb 13.6, PLT 245, A1c 5.6, potassium 4.3, BUN 16, serum creatinine 0.83, albumin 3.9, AST/ALT normal 08/2020 - TSH normal  Past Medical History:  Diagnosis Date  . Cancer ([place]) 2021   Uterine   . COVID   . Hypertension   . Morbid obesity ([place])   . Sleep apnea     Past Surgical History:  Procedure Laterality Date  . DILATION AND CURETTAGE, DIAGNOSTIC / THERAPEUTIC    . EXPLORATORY LAPAROTOMY     for ectopic pregnancy  . TOTAL LAPAROSCOPIC HYSTERECTOMY WITH BILATERAL SALPINGO OOPHORECTOMY Bilateral 05/22/2020   Procedure: TOTAL LAPAROSCOPIC HYSTERECTOMY WITH BILATERAL SALPINGO OOPHORECTOMY AND WASHINGS, PERINEAL BIOPSY;  Surgeon: Mellody Drown, MD;  Location: [facility] ORS;  Service: Gynecology;  Laterality: Bilateral;    Current Medications: Current Meds  Medication Sig  . amLODipine (NORVASC) 5 MG tablet Take 1 tablet (5 mg total) by mouth daily.  . naproxen sodium (ALEVE) 220 MG tablet Take 220 mg by mouth as needed.    Allergies:   Patient has no known allergies.   Social History   Socioeconomic History  . Marital status: Widowed    Spouse name: Not on file  . Number of children: Not on file  . Years of education: Not on file  . Highest education level: Not on file  Occupational History  .  Not on file  Tobacco Use  . Smoking status: Never Smoker  . Smokeless tobacco: Never Used  Vaping Use  . Vaping Use: Never used  Substance and Sexual Activity  . Alcohol use: Never  . Drug use: Never  . Sexual activity: Not Currently  Other Topics Concern  . Not on file  Social History Narrative  . Not on file   Social Determinants of Health   Financial Resource Strain: Not on file  Food Insecurity: Not on file   Transportation Needs: Not on file  Physical Activity: Not on file  Stress: Not on file  Social Connections: Not on file     Family History:  The patient's family history includes Cancer in her father and mother; Heart attack in her mother.  ROS:   Review of Systems  Constitutional: Positive for malaise/fatigue. Negative for chills, diaphoresis, fever and weight loss.  HENT: Negative for congestion.   Eyes: Negative for discharge and redness.  Respiratory: Positive for shortness of breath. Negative for cough, sputum production and wheezing.   Cardiovascular: Positive for chest pain. Negative for palpitations, orthopnea, claudication, leg swelling and PND.  Gastrointestinal: Positive for nausea. Negative for abdominal pain, heartburn and vomiting.  Musculoskeletal: Negative for falls and myalgias.  Skin: Negative for rash.  Neurological: Positive for weakness. Negative for dizziness, tingling, tremors, sensory change, speech change, focal weakness and loss of consciousness.  Endo/Heme/Allergies: Does not bruise/bleed easily.  Psychiatric/Behavioral: Negative for substance abuse. The patient is not nervous/anxious.   All other systems reviewed and are negative.    EKGs/Labs/Other Studies Reviewed:    Studies reviewed were summarized above. The additional studies were reviewed today:  2D echo 12/06/2019: 1. Left ventricular ejection fraction, by estimation, is 55 to 60%. The  left ventricle has normal function. The left ventricle has no regional  wall motion abnormalities. Left ventricular diastolic parameters were  normal.  2. Right ventricular systolic function is normal. The right ventricular  size is normal.  3. The mitral valve is normal in structure. No evidence of mitral valve  regurgitation. No evidence of mitral stenosis.  4. The aortic valve is normal in structure. Aortic valve regurgitation is  not visualized. No aortic stenosis is present.  5. The inferior vena  cava is normal in size with greater than 50%  respiratory variability, suggesting right atrial pressure of 3 mmHg. __________  Carlton Adam MPI 10/13/2019: Pharmacological myocardial perfusion imaging study with no significant  ischemia Normal wall motion, EF estimated at 47% (depressed ejection fraction likely secondary to GI uptake artifact) No EKG changes concerning for ischemia at peak stress or in recovery. Low risk scan __________  ABIs 01/15/2020: +-------+-----------+-----------+  ABI/TBIToday's ABIToday's TBI  +-------+-----------+-----------+  Right 1.09    1.12      +-------+-----------+-----------+  Left  1.07    1.03      +-------+-----------+-----------+   Summary:  Right: Resting right ankle-brachial index is within normal range. No  evidence of significant right lower extremity arterial disease. The right  toe-brachial index is normal.   Left: Resting left ankle-brachial index is within normal range. No  evidence of significant left lower extremity arterial disease. The left  toe-brachial index is normal.    EKG:  EKG is ordered today.  The EKG ordered today demonstrates NSR, 72 bpm, lateral T wave inversion improved from prior tracing  Recent Labs: 08/27/2020: TSH 0.588 01/14/2021: ALT 10; BNP 21.4; BUN 16; Creatinine, Ser 0.83; Hemoglobin 13.6; Platelets 245; Potassium 4.3; Sodium 142  Recent Lipid Panel    Component Value Date/Time   CHOL 183 01/14/2021 0922   TRIG 69 01/14/2021 0922   HDL 68 01/14/2021 0922   LDLCALC 102 (H) 01/14/2021 0922    PHYSICAL EXAM:    VS:  BP (!) 142/100 (BP Location: Left Arm, Patient Position: Sitting, Cuff Size: Large)   Pulse 72   Ht 5\' 1"  (1.549 m)   Wt 246 lb (111.6 kg)   LMP  (LMP Unknown)   SpO2 98%   BMI 46.48 kg/m   BMI: Body mass index is 46.48 kg/m.  Physical Exam Vitals reviewed.  Constitutional:      Appearance: She is well-developed.  HENT:     Head: Normocephalic and  atraumatic.  Eyes:     General:        Right eye: No discharge.        Left eye: No discharge.  Neck:     Vascular: No JVD.  Cardiovascular:     Rate and Rhythm: Normal rate and regular rhythm.     Pulses: No midsystolic click and no opening snap.          Posterior tibial pulses are 2+ on the right side and 2+ on the left side.     Heart sounds: Normal heart sounds, S1 normal and S2 normal. Heart sounds not distant. No murmur heard. No friction rub.  Pulmonary:     Effort: Pulmonary effort is normal. No respiratory distress.     Breath sounds: Normal breath sounds. No decreased breath sounds, wheezing or rales.  Chest:     Chest wall: No tenderness.  Abdominal:     General: There is no distension.     Palpations: Abdomen is soft.     Tenderness: There is no abdominal tenderness.  Musculoskeletal:     Cervical back: Normal range of motion.  Skin:    General: Skin is warm and dry.     Nails: There is no clubbing.  Neurological:     Mental Status: She is alert and oriented to person, place, and time.  Psychiatric:        Speech: Speech normal.        Behavior: Behavior normal.        Thought Content: Thought content normal.        Judgment: Judgment normal.     Wt Readings from Last 3 Encounters:  01/17/21 246 lb (111.6 kg)  01/14/21 245 lb 9.6 oz (111.4 kg)  12/25/20 244 lb 3.2 oz (110.8 kg)     ASSESSMENT & PLAN:   1. Exertional dyspnea/chest discomfort: Currently symptom-free.  Recent cardiac work-up was unrevealing including echo and Lexiscan MPI.  Suspect her symptoms are multifactorial including poorly controlled blood pressure with sleep apnea with possible component of diastolic dysfunction, morbid obesity, and physical deconditioning.  That said, I cannot exclude development of ischemic heart disease with EKG demonstrating more pronounced lateral T wave inversion in the context of exertional symptoms.  We will plan to proceed with an echo and Lexiscan MPI (deferred  coronary CTA given national contrast shortage).  No significant coronary artery calcifications on CT attenuation corrected images on prior MPI.  If she is found to have development of coronary artery calcium on CT attenuation corrected images on updated Lexiscan would recommend escalation of medical therapy and risk factor modification to achieve a target LDL of less than 70.  2. HTN: Most recent BP readings have been in the upper 130s to 140s over 80s  to low 100s.  She previously self discontinued her antihypertensive therapy.  Start amlodipine 5 mg daily.  Low-sodium diet recommended.  Adherence to CPAP recommended.  3. Morbid obesity with OSA: BMI greater than 40.  Weight loss is advised with heart healthy diet and regular exercise.  Not adherent to CPAP, compliance is recommended.  Disposition: F/u with Dr. Garen Lah or an APP in 1 month.   Medication Adjustments/Labs and Tests Ordered: Current medicines are reviewed at length with the patient today.  Concerns regarding medicines are outlined above. Medication changes, Labs and Tests ordered today are summarized above and listed in the Patient Instructions accessible in Encounters.   Signed, Christell Faith, PA-C 01/17/2021 8:30 AM     [address] [place], [place] [address] [phone]

## 2021-01-15 NOTE — Addendum Note (Signed)
Addended by: Vance Peper A on: 01/15/2021 04:32 PM   Modules accepted: Orders

## 2021-01-17 ENCOUNTER — Ambulatory Visit (INDEPENDENT_AMBULATORY_CARE_PROVIDER_SITE_OTHER): Payer: Medicare Other | Admitting: Physician Assistant

## 2021-01-17 ENCOUNTER — Other Ambulatory Visit: Payer: Self-pay

## 2021-01-17 ENCOUNTER — Encounter: Payer: Self-pay | Admitting: Physician Assistant

## 2021-01-17 VITALS — BP 142/100 | HR 72 | Ht 61.0 in | Wt 246.0 lb

## 2021-01-17 DIAGNOSIS — I10 Essential (primary) hypertension: Secondary | ICD-10-CM | POA: Diagnosis not present

## 2021-01-17 DIAGNOSIS — R0609 Other forms of dyspnea: Secondary | ICD-10-CM

## 2021-01-17 DIAGNOSIS — R072 Precordial pain: Secondary | ICD-10-CM

## 2021-01-17 DIAGNOSIS — G4730 Sleep apnea, unspecified: Secondary | ICD-10-CM

## 2021-01-17 DIAGNOSIS — R0600 Dyspnea, unspecified: Secondary | ICD-10-CM

## 2021-01-17 MED ORDER — AMLODIPINE BESYLATE 5 MG PO TABS: 5.0000 mg | ORAL_TABLET | Freq: Every day | ORAL | 3 refills | Status: DC

## 2021-01-17 NOTE — Patient Instructions (Addendum)
Medication Instructions:  Your physician has recommended you make the following change in your medication:   1. START Amlodipine 5 mg once daily  *If you need a refill on your cardiac medications before your next appointment, please call your pharmacy*   Lab Work: None  If you have labs (blood work) drawn today and your tests are completely normal, you will receive your results only by: Marland Kitchen MyChart Message (if you have MyChart) OR . A paper copy in the mail If you have any lab test that is abnormal or we need to change your treatment, we will call you to review the results.   Testing/Procedures: Your physician has requested that you have an echocardiogram.   Echocardiography is a painless test that uses sound waves to create images of your heart. It provides your doctor with information about the size and shape of your heart and how well your heart's chambers and valves are working. This procedure takes approximately one hour. There are no restrictions for this procedure.  [place]  Your caregiver has ordered a Stress Test with nuclear imaging.   The purpose of this test is to evaluate the blood supply to your heart muscle. This procedure is referred to as a "Non-Invasive Stress Test." This is because other than having an IV started in your vein, nothing is inserted or "invades" your body. Cardiac stress tests are done to find areas of poor blood flow to the heart by determining the extent of coronary artery disease (CAD). Some patients exercise on a treadmill, which naturally increases the blood flow to your heart, while others who are  unable to walk on a treadmill due to physical limitations have a pharmacologic/chemical stress agent called Lexiscan . This medicine will mimic walking on a treadmill by temporarily increasing your coronary blood flow.   Please note: these test may take anywhere between 2-4 hours to complete  PLEASE REPORT TO [place] AT THE FIRST DESK WILL DIRECT YOU WHERE TO GO  Date of Procedure:_____________________________________  Arrival Time for Procedure:______________________________   PLEASE NOTIFY THE OFFICE AT LEAST 24 HOURS IN ADVANCE IF YOU ARE UNABLE TO KEEP YOUR APPOINTMENT.  [phone] AND  PLEASE NOTIFY NUCLEAR MEDICINE AT [facility] AT LEAST 24 HOURS IN ADVANCE IF YOU ARE UNABLE TO KEEP YOUR APPOINTMENT. [phone]  How to prepare for your Myoview test:  1. Do not eat or drink after midnight 2. No caffeine for 24 hours prior to test 3. No smoking 24 hours prior to test. 4. Your medication may be taken with water.  If your doctor stopped a medication because of this test, do not take that medication. 5. Ladies, please do not wear dresses.  Skirts or pants are appropriate. Please wear a short sleeve shirt. 6. No perfume, cologne or lotion. 7. Wear comfortable walking shoes. No heels!     Follow-Up: At [facility], you and your health needs are our priority.  As part of our continuing mission to provide you with exceptional heart care, we have created designated Provider Care Teams.  These Care Teams include your primary Cardiologist (physician) and Advanced Practice Providers (APPs -  Physician Assistants and Nurse Practitioners) who all work together to provide you with the care you need, when you need it.   Your next appointment:   1 month(s)  The format for your next appointment:   In Person  Provider:   You may see Kate Sable, MD or one of the following  Advanced Practice Providers on your designated Care Team:    Murray Hodgkins, NP  Christell Faith, PA-C  Marrianne Mood, PA-C  Cadence Westfield, [place]  Laurann Montana, NP

## 2021-01-23 ENCOUNTER — Encounter: Payer: Self-pay | Admitting: Podiatry

## 2021-01-23 ENCOUNTER — Ambulatory Visit (INDEPENDENT_AMBULATORY_CARE_PROVIDER_SITE_OTHER): Payer: Medicare Other | Admitting: Podiatry

## 2021-01-23 ENCOUNTER — Other Ambulatory Visit: Payer: Self-pay

## 2021-01-23 DIAGNOSIS — M722 Plantar fascial fibromatosis: Secondary | ICD-10-CM | POA: Diagnosis not present

## 2021-01-27 ENCOUNTER — Encounter
Admission: RE | Admit: 2021-01-27 | Discharge: 2021-01-27 | Disposition: A | Discharge status: Home / Self Care | Payer: Medicare Other | Source: Ambulatory Visit | Attending: Physician Assistant | Admitting: Physician Assistant

## 2021-01-27 ENCOUNTER — Other Ambulatory Visit: Payer: Self-pay

## 2021-01-27 DIAGNOSIS — R0600 Dyspnea, unspecified: Secondary | ICD-10-CM

## 2021-01-27 DIAGNOSIS — R0609 Other forms of dyspnea: Secondary | ICD-10-CM

## 2021-01-27 MED ORDER — REGADENOSON 0.4 MG/5ML IV SOLN
0.4000 mg | Freq: Once | INTRAVENOUS | Status: AC
  Administered 2021-01-27: 0.4 mg via INTRAVENOUS
  Filled 2021-01-27: qty 5

## 2021-01-27 MED ORDER — TECHNETIUM TC 99M TETROFOSMIN IV KIT
30.0000 | PACK | Freq: Once | INTRAVENOUS | Status: AC | PRN
  Administered 2021-01-27: 30 via INTRAVENOUS

## 2021-01-27 MED ORDER — TECHNETIUM TC 99M TETROFOSMIN IV KIT
10.0000 | PACK | Freq: Once | INTRAVENOUS | Status: AC | PRN
  Administered 2021-01-27: 9.57 via INTRAVENOUS

## 2021-01-28 LAB — NM MYOCAR MULTI W/SPECT W/WALL MOTION / EF
Estimated workload: 1 METS
Exercise duration (min): 0 min
Exercise duration (sec): 0 s
LV dias vol: 120 mL (ref 46–106)
LV sys vol: 49 mL
MPHR: 163 {beats}/min
Peak HR: 93 {beats}/min
Percent HR: 57 %
Rest HR: 66 {beats}/min
SDS: 1
SRS: 9
SSS: 1
TID: 0.9

## 2021-01-29 ENCOUNTER — Encounter: Payer: Self-pay | Admitting: Podiatry

## 2021-01-29 NOTE — Progress Notes (Signed)
  Subjective:  Patient ID: Allison Dixon, female    DOB: [birth year],  MRN: [id number]  Chief Complaint  Patient presents with  . Foot Pain    Patient referred by pcp for left heel and hallux pain x 3 months.  She says her heel is most painful in the mornings and standing from sitting.   Her left hallux aches and burns at times    60 y.o. female presents with the above complaint.  Patient presents with complaint of left heel pain that has been going on for past 3 months.  She states is mostly in the heel is more is worse when morning and standing from sitting position.  It aches in the arch and burns at times.  She has not seen and was prior to seeing me.  She would like to discuss treatment options for this.  She is tried Aleve but nothing else for pain.  She was referred here by primary care physician and x-ray was done in their office.   Review of Systems: Negative except as noted in the HPI. Denies N/V/F/Ch.  Past Medical History:  Diagnosis Date  . Cancer ([place]) 2021   Uterine   . COVID   . Hypertension   . Morbid obesity ([place])   . Sleep apnea     Current Outpatient Medications:  .  amLODipine (NORVASC) 5 MG tablet, Take 1 tablet (5 mg total) by mouth daily., Disp: 180 tablet, Rfl: 3 .  naproxen sodium (ALEVE) 220 MG tablet, Take 220 mg by mouth as needed., Disp: , Rfl:   Social History   Tobacco Use  Smoking Status Never Smoker  Smokeless Tobacco Never Used    No Known Allergies Objective:  There were no vitals filed for this visit. There is no height or weight on file to calculate BMI. Constitutional Well developed. Well nourished.  Vascular Dorsalis pedis pulses palpable bilaterally. Posterior tibial pulses palpable bilaterally. Capillary refill normal to all digits.  No cyanosis or clubbing noted. Pedal hair growth normal.  Neurologic Normal speech. Oriented to person, place, and time. Epicritic sensation to light touch grossly present bilaterally.  Dermatologic  Nails well groomed and normal in appearance. No open wounds. No skin lesions.  Orthopedic: Normal joint ROM without pain or crepitus bilaterally. No visible deformities. Tender to palpation at the calcaneal tuber left. No pain with calcaneal squeeze left. Ankle ROM diminished range of motion left. Silfverskiold Test: positive left.   Radiographs: Taken and reviewed. No acute fractures or dislocations. No evidence of stress fracture.  Plantar heel spur present. Posterior heel spur present.   Assessment:   1. Plantar fasciitis, left    Plan:  Patient was evaluated and treated and all questions answered.  Plantar Fasciitis, left - XR reviewed as above.  - Educated on icing and stretching. Instructions given.  - Injection delivered to the plantar fascia as below. - DME: Plantar Fascial Brace - Pharmacologic management: None  Procedure: Injection Tendon/Ligament Location: Left plantar fascia at the glabrous junction; medial approach. Skin Prep: alcohol Injectate: 0.5 cc 0.5% marcaine plain, 0.5 cc of 1% Lidocaine, 0.5 cc kenalog 10. Disposition: Patient tolerated procedure well. Injection site dressed with a band-aid.  No follow-ups on file.

## 2021-02-17 ENCOUNTER — Other Ambulatory Visit: Payer: Self-pay

## 2021-02-17 ENCOUNTER — Ambulatory Visit (INDEPENDENT_AMBULATORY_CARE_PROVIDER_SITE_OTHER): Payer: Medicare Other | Admitting: Nurse Practitioner

## 2021-02-17 ENCOUNTER — Encounter: Payer: Self-pay | Admitting: Nurse Practitioner

## 2021-02-17 VITALS — BP 126/79 | HR 81 | Temp 98.2°F | Ht 61.0 in | Wt 244.2 lb

## 2021-02-17 DIAGNOSIS — G8929 Other chronic pain: Secondary | ICD-10-CM

## 2021-02-17 DIAGNOSIS — R0602 Shortness of breath: Secondary | ICD-10-CM

## 2021-02-17 DIAGNOSIS — M5441 Lumbago with sciatica, right side: Secondary | ICD-10-CM

## 2021-02-17 DIAGNOSIS — I10 Essential (primary) hypertension: Secondary | ICD-10-CM

## 2021-02-17 DIAGNOSIS — L509 Urticaria, unspecified: Secondary | ICD-10-CM

## 2021-02-17 MED ORDER — FAMOTIDINE 20 MG PO TABS: 20.0000 mg | ORAL_TABLET | Freq: Every day | ORAL | 0 refills | Status: DC

## 2021-02-17 MED ORDER — PREDNISONE 10 MG PO TABS: ORAL_TABLET | ORAL | 0 refills | Status: DC

## 2021-02-17 NOTE — Patient Instructions (Addendum)
Zyrtec or claritin in the morning Benadryl at night Pepcid take at lunch time

## 2021-02-17 NOTE — Assessment & Plan Note (Signed)
BMI is 46.1. Discussed healthy eating and light exercise. Goal is to lose 1-2 pounds per week.

## 2021-02-17 NOTE — Assessment & Plan Note (Signed)
Chronic, stable. Cardiology re-started amlodipine about a month ago. Continue current regimen. Follow-up in 6 months.

## 2021-02-17 NOTE — Progress Notes (Signed)
Established Patient Office Visit  Subjective:  Patient ID: Allison Dixon, female    DOB: [birth year]  Age: 60 y.o. MRN: [id number]  CC:  Chief Complaint  Patient presents with   Shortness of Breath    4 week f/up- pt states she is not much better   Back Pain    Pt states she is still having a lot of back pain- rating pain at an 8 and has been going to PT    HPI Allison Dixon presents for back pain with sciatica, shortness of breath, and hives.   RASH  Duration: 2 weeks Location: arms  Itching: yes Burning: no Redness: yes Oozing: no Scaling: no Blisters: no Painful: no Fevers: no Change in detergents/soaps/personal care products: no Recent illness: no Recent travel:no History of same: no Context: fluctuating Alleviating factors: benadryl Treatments attempted:benadryl Shortness of breath: yes- chronic Throat/tongue swelling: no  BACK PAIN   She is still having back pain with radiation down her right leg. She states that she has been going to physical therapy. Her pain has not improved since last visit and is still 8/10. She is taking tylenol at home as needed for pain which helps very minimally.   SHORTNESS OF BREATH  She still endorses shortness of breath with exertion. She saw cardiology who re-started her on amlodipine for her blood pressure. She had a stress test which was low risk for cardiac disease. She has seen pulmonology in the past as well. She denies fevers and chest pain.    Past Medical History:  Diagnosis Date   Cancer ([place]) 2021   Uterine    COVID    Hypertension    Morbid obesity ([place])    Sleep apnea     Past Surgical History:  Procedure Laterality Date   DILATION AND CURETTAGE, DIAGNOSTIC / THERAPEUTIC     EXPLORATORY LAPAROTOMY     for ectopic pregnancy   TOTAL LAPAROSCOPIC HYSTERECTOMY WITH BILATERAL SALPINGO OOPHORECTOMY Bilateral 05/22/2020   Procedure: TOTAL LAPAROSCOPIC HYSTERECTOMY WITH BILATERAL SALPINGO OOPHORECTOMY AND WASHINGS,  PERINEAL BIOPSY;  Surgeon: Mellody Drown, MD;  Location: [facility] ORS;  Service: Gynecology;  Laterality: Bilateral;    Family History  Problem Relation Age of Onset   Heart attack Mother    Cancer Mother    Cancer Father     Social History   Socioeconomic History   Marital status: Widowed    Spouse name: Not on file   Number of children: Not on file   Years of education: Not on file   Highest education level: Not on file  Occupational History   Not on file  Tobacco Use   Smoking status: Never   Smokeless tobacco: Never  Vaping Use   Vaping Use: Never used  Substance and Sexual Activity   Alcohol use: Never   Drug use: Never   Sexual activity: Not Currently  Other Topics Concern   Not on file  Social History Narrative   Not on file   Social Determinants of Health   Financial Resource Strain: Not on file  Food Insecurity: Not on file  Transportation Needs: Not on file  Physical Activity: Not on file  Stress: Not on file  Social Connections: Not on file  Intimate Partner Violence: Not on file    Outpatient Medications Prior to Visit  Medication Sig Dispense Refill   amLODipine (NORVASC) 5 MG tablet Take 1 tablet (5 mg total) by mouth daily. 180 tablet 3   naproxen sodium (ALEVE) 220  MG tablet Take 220 mg by mouth as needed. (Patient not taking: Reported on 02/17/2021)     No facility-administered medications prior to visit.    No Known Allergies  ROS Review of Systems  Constitutional:  Positive for fatigue. Negative for fever.  HENT: Negative.    Eyes: Negative.   Respiratory:  Positive for shortness of breath (with exertion).   Cardiovascular: Negative.   Gastrointestinal: Negative.   Genitourinary: Negative.   Musculoskeletal:  Positive for back pain.       Foot pain has improved after injection with podiatry  Skin:  Positive for rash (left arm).  Neurological: Negative.      Objective:    Physical Exam Vitals and nursing note reviewed.   Constitutional:      General: She is not in acute distress.    Appearance: Normal appearance.  HENT:     Head: Normocephalic and atraumatic.  Eyes:     Conjunctiva/sclera: Conjunctivae normal.  Neck:     Vascular: No carotid bruit.  Cardiovascular:     Rate and Rhythm: Normal rate and regular rhythm.     Pulses: Normal pulses.     Heart sounds: Normal heart sounds.  Pulmonary:     Effort: Pulmonary effort is normal.     Breath sounds: Normal breath sounds.  Musculoskeletal:        General: Normal range of motion.     Cervical back: Normal range of motion.  Skin:    General: Skin is warm and dry.     Findings: Rash (uticaria noted to left forearm) present.  Neurological:     General: No focal deficit present.     Mental Status: She is alert and oriented to person, place, and time.  Psychiatric:        Mood and Affect: Mood normal.        Behavior: Behavior normal.        Thought Content: Thought content normal.        Judgment: Judgment normal.    BP 126/79   Pulse 81   Temp 98.2 F (36.8 C) (Oral)   Ht _0  (1.549 m)   Wt 244 lb 3.2 oz (110.8 kg)   LMP  (LMP Unknown)   SpO2 97%   BMI 46.14 kg/m  Wt Readings from Last 3 Encounters:  02/17/21 244 lb 3.2 oz (110.8 kg)  01/17/21 246 lb (111.6 kg)  01/14/21 245 lb 9.6 oz (111.4 kg)     Health Maintenance Due  Topic Date Due   COVID-19 Vaccine (1) Never done   Pneumococcal Vaccine 69-88 Years old (1 - PCV) Never done   Zoster Vaccines- Shingrix (1 of 2) Never done    There are no preventive care reminders to display for this patient.  Lab Results  Component Value Date   TSH 0.588 08/27/2020   Lab Results  Component Value Date   WBC 6.4 01/14/2021   HGB 13.6 01/14/2021   HCT 42.0 01/14/2021   MCV 86 01/14/2021   PLT 245 01/14/2021   Lab Results  Component Value Date   NA 142 01/14/2021   K 4.3 01/14/2021   CO2 25 01/14/2021   GLUCOSE 97 01/14/2021   BUN 16 01/14/2021   CREATININE 0.83 01/14/2021    BILITOT 0.5 01/14/2021   ALKPHOS 93 01/14/2021   AST 15 01/14/2021   ALT 10 01/14/2021   PROT 7.6 01/14/2021   ALBUMIN 3.9 01/14/2021   CALCIUM 9.3 01/14/2021   ANIONGAP 10 08/27/2020  EGFR 82 01/14/2021   Lab Results  Component Value Date   CHOL 183 01/14/2021   Lab Results  Component Value Date   HDL 68 01/14/2021   Lab Results  Component Value Date   LDLCALC 102 (H) 01/14/2021   Lab Results  Component Value Date   TRIG 69 01/14/2021   No results found for: [facility] Lab Results  Component Value Date   HGBA1C 5.6 01/14/2021      Assessment & Plan:   Problem List Items Addressed This Visit       Cardiovascular and Mediastinum   Primary hypertension    Chronic, stable. Cardiology re-started amlodipine about a month ago. Continue current regimen. Follow-up in 6 months.          Nervous and Auditory   Chronic bilateral low back pain with right-sided sciatica - Primary    Chronic, still having 8/10 pain. Will treat with prednisone taper. Continue physical therapy. Check lumbar x-ray. Consider MRI and gabapentin at next visit if still having pain. Follow-up in 4 weeks.        Relevant Medications   predniSONE (DELTASONE) 10 MG tablet   Other Relevant Orders   DG Lumbar Spine Complete     Other   Shortness of breath    Saw cardiology last month with low risk stress test. Saw pulmonology 05/07/20 with negative work-up. Most likely due to deconditioning. Discussed diet and exercise. Follow-up in 4 weeks or with any concerns.        Morbid obesity ([facility])    BMI is 46.1. Discussed healthy eating and light exercise. Goal is to lose 1-2 pounds per week.        Other Visit Diagnoses     Hives       Acute x 2 weeks. Unknown trigger. Will treat with benadryl, claritin or zyrtec, and pepcid. Follow up if symptoms don't improve or worsen       Meds ordered this encounter  Medications   predniSONE (DELTASONE) 10 MG tablet    Sig: Take 6 tablets today,  then 5 tablets tomorrow, then decrease by 1 tablet every day until gone    Dispense:  21 tablet    Refill:  0   famotidine (PEPCID) 20 MG tablet    Sig: Take 1 tablet (20 mg total) by mouth daily.    Dispense:  14 tablet    Refill:  0     Follow-up: Return in about 4 weeks (around 03/17/2021) for back pain .    Allison Dancer, NP

## 2021-02-17 NOTE — Assessment & Plan Note (Signed)
Chronic, still having 8/10 pain. Will treat with prednisone taper. Continue physical therapy. Check lumbar x-ray. Consider MRI and gabapentin at next visit if still having pain. Follow-up in 4 weeks.

## 2021-02-17 NOTE — Assessment & Plan Note (Signed)
Saw cardiology last month with low risk stress test. Saw pulmonology 05/07/20 with negative work-up. Most likely due to deconditioning. Discussed diet and exercise. Follow-up in 4 weeks or with any concerns.

## 2021-02-18 ENCOUNTER — Ambulatory Visit
Admission: RE | Admit: 2021-02-18 | Discharge: 2021-02-18 | Disposition: A | Discharge status: Home / Self Care | Payer: Medicare Other | Attending: Nurse Practitioner | Admitting: Nurse Practitioner

## 2021-02-18 ENCOUNTER — Ambulatory Visit
Admission: RE | Admit: 2021-02-18 | Discharge: 2021-02-18 | Disposition: A | Discharge status: Home / Self Care | Payer: Medicare Other | Source: Ambulatory Visit | Attending: Nurse Practitioner | Admitting: Nurse Practitioner

## 2021-02-18 DIAGNOSIS — M5441 Lumbago with sciatica, right side: Secondary | ICD-10-CM | POA: Insufficient documentation

## 2021-02-18 DIAGNOSIS — G8929 Other chronic pain: Secondary | ICD-10-CM | POA: Diagnosis present

## 2021-02-19 ENCOUNTER — Ambulatory Visit (INDEPENDENT_AMBULATORY_CARE_PROVIDER_SITE_OTHER): Payer: Medicare Other

## 2021-02-19 ENCOUNTER — Other Ambulatory Visit: Payer: Self-pay

## 2021-02-19 DIAGNOSIS — R0600 Dyspnea, unspecified: Secondary | ICD-10-CM

## 2021-02-19 DIAGNOSIS — R0609 Other forms of dyspnea: Secondary | ICD-10-CM

## 2021-02-19 LAB — ECHOCARDIOGRAM COMPLETE
AR max vel: 1.99 cm2
AV Area VTI: 2.03 cm2
AV Area mean vel: 2.04 cm2
AV Mean grad: 3 mmHg
AV Peak grad: 5.6 mmHg
Ao pk vel: 1.18 m/s
Area-P 1/2: 3.72 cm2
S' Lateral: 3.6 cm

## 2021-02-19 MED ORDER — PERFLUTREN LIPID MICROSPHERE
1.0000 mL | INTRAVENOUS | Status: AC | PRN
  Administered 2021-02-19: 2 mL via INTRAVENOUS

## 2021-02-20 ENCOUNTER — Encounter: Payer: Self-pay | Admitting: Podiatry

## 2021-02-20 ENCOUNTER — Ambulatory Visit (INDEPENDENT_AMBULATORY_CARE_PROVIDER_SITE_OTHER): Payer: Medicare Other | Admitting: Podiatry

## 2021-02-20 DIAGNOSIS — M722 Plantar fascial fibromatosis: Secondary | ICD-10-CM

## 2021-02-20 DIAGNOSIS — M7732 Calcaneal spur, left foot: Secondary | ICD-10-CM | POA: Diagnosis not present

## 2021-02-25 ENCOUNTER — Encounter: Payer: Self-pay | Admitting: Podiatry

## 2021-02-25 NOTE — Progress Notes (Signed)
  Subjective:  Patient ID: Allison Dixon, female    DOB: 20-May-1961,  MRN: [id number]  Chief Complaint  Patient presents with   Plantar Fasciitis    "Its better but still hurts in the mornings when I get up"    60 y.o. female presents with the above complaint.  Patient presents for follow-up to left Planter fasciitis.  She states she is doing a lot better she is about 5060% improved the injection and bracing helped considerably.  She would like to discuss next treatment options.  She denies any other acute complaints.  Review of Systems: Negative except as noted in the HPI. Denies N/V/F/Ch.  Past Medical History:  Diagnosis Date   Cancer ([place]) 2021   Uterine    COVID    Hypertension    Morbid obesity ([place])    Sleep apnea     Current Outpatient Medications:    amLODipine (NORVASC) 5 MG tablet, Take 1 tablet (5 mg total) by mouth daily., Disp: 180 tablet, Rfl: 3   famotidine (PEPCID) 20 MG tablet, Take 1 tablet (20 mg total) by mouth daily., Disp: 14 tablet, Rfl: 0   naproxen sodium (ALEVE) 220 MG tablet, Take 220 mg by mouth as needed. (Patient not taking: Reported on 02/17/2021), Disp: , Rfl:    predniSONE (DELTASONE) 10 MG tablet, Take 6 tablets today, then 5 tablets tomorrow, then decrease by 1 tablet every day until gone, Disp: 21 tablet, Rfl: 0  Social History   Tobacco Use  Smoking Status Never  Smokeless Tobacco Never    No Known Allergies Objective:  There were no vitals filed for this visit. There is no height or weight on file to calculate BMI. Constitutional Well developed. Well nourished.  Vascular Dorsalis pedis pulses palpable bilaterally. Posterior tibial pulses palpable bilaterally. Capillary refill normal to all digits.  No cyanosis or clubbing noted. Pedal hair growth normal.  Neurologic Normal speech. Oriented to person, place, and time. Epicritic sensation to light touch grossly present bilaterally.  Dermatologic Nails well groomed and normal in  appearance. No open wounds. No skin lesions.  Orthopedic: Normal joint ROM without pain or crepitus bilaterally. No visible deformities. Tender to palpation at the calcaneal tuber left. No pain with calcaneal squeeze left. Ankle ROM diminished range of motion left. Silfverskiold Test: positive left.   Radiographs: Taken and reviewed. No acute fractures or dislocations. No evidence of stress fracture.  Plantar heel spur present. Posterior heel spur present.   Assessment:   1. Plantar fasciitis, left   2. Heel spur, left     Plan:  Patient was evaluated and treated and all questions answered.  Plantar Fasciitis, left - XR reviewed as above.  - Educated on icing and stretching. Instructions given.  -Second injection delivered to the plantar fascia as below. - DME: Night splint - Pharmacologic management: None  Procedure: Injection Tendon/Ligament Location: Left plantar fascia at the glabrous junction; medial approach. Skin Prep: alcohol Injectate: 0.5 cc 0.5% marcaine plain, 0.5 cc of 1% Lidocaine, 0.5 cc kenalog 10. Disposition: Patient tolerated procedure well. Injection site dressed with a band-aid.  No follow-ups on file.

## 2021-02-27 ENCOUNTER — Encounter: Payer: Self-pay | Admitting: Cardiology

## 2021-02-27 ENCOUNTER — Other Ambulatory Visit: Payer: Self-pay

## 2021-02-27 ENCOUNTER — Ambulatory Visit (INDEPENDENT_AMBULATORY_CARE_PROVIDER_SITE_OTHER): Payer: Medicare Other | Admitting: Cardiology

## 2021-02-27 VITALS — BP 124/80 | HR 79 | Ht 61.0 in | Wt 242.2 lb

## 2021-02-27 DIAGNOSIS — G4730 Sleep apnea, unspecified: Secondary | ICD-10-CM | POA: Diagnosis not present

## 2021-02-27 DIAGNOSIS — R0600 Dyspnea, unspecified: Secondary | ICD-10-CM | POA: Diagnosis not present

## 2021-02-27 DIAGNOSIS — R0609 Other forms of dyspnea: Secondary | ICD-10-CM

## 2021-02-27 NOTE — Progress Notes (Signed)
Cardiology Office Note:    Date:  02/27/2021   ID:  Allison Dixon, DOB [birth year], MRN [id number]  PCP:  Charyl Dancer, NP  Cardiologist:  Kate Sable, MD  Electrophysiologist:  None   Referring MD: No ref. provider found   Chief Complaint  Patient presents with   1 month follow up     Patient c/o chest pain and shortness of breath. Medications reviewed by the patient verbally.     History of Present Illness:    Allison Dixon is a 60 y.o. female with a hx of obesity, hypertension, OSA/CPAP nightly who presents for follow-up.  Last seen in the clinic due to shortness of breath with exertion.  Echocardiogram and Lexiscan Myoview were ordered to evaluate cardiac function and presence of ischemia.  She still has shortness of breath with exertion, takes BP meds as prescribed.  Presents for testing results.  Endorses daytime fatigue.  Prior notes Lexiscan Myoview 01/2021, no evidence for ischemia, low risk scan.  EF 55% Echo 02/2021, EF 59%, normal systolic and diastolic function.  Past Medical History:  Diagnosis Date   Cancer ([place]) 2021   Uterine    COVID    Hypertension    Morbid obesity ([place])    Sleep apnea     Past Surgical History:  Procedure Laterality Date   DILATION AND CURETTAGE, DIAGNOSTIC / THERAPEUTIC     EXPLORATORY LAPAROTOMY     for ectopic pregnancy   TOTAL LAPAROSCOPIC HYSTERECTOMY WITH BILATERAL SALPINGO OOPHORECTOMY Bilateral 05/22/2020   Procedure: TOTAL LAPAROSCOPIC HYSTERECTOMY WITH BILATERAL SALPINGO OOPHORECTOMY AND WASHINGS, PERINEAL BIOPSY;  Surgeon: Mellody Drown, MD;  Location: [facility] ORS;  Service: Gynecology;  Laterality: Bilateral;    Current Medications: Current Meds  Medication Sig   amLODipine (NORVASC) 5 MG tablet Take 1 tablet (5 mg total) by mouth daily.   famotidine (PEPCID) 20 MG tablet Take 1 tablet (20 mg total) by mouth daily.     Allergies:   Patient has no known allergies.   Social History   Socioeconomic History    Marital status: Widowed    Spouse name: Not on file   Number of children: Not on file   Years of education: Not on file   Highest education level: Not on file  Occupational History   Not on file  Tobacco Use   Smoking status: Never   Smokeless tobacco: Never  Vaping Use   Vaping Use: Never used  Substance and Sexual Activity   Alcohol use: Never   Drug use: Never   Sexual activity: Not Currently  Other Topics Concern   Not on file  Social History Narrative   Not on file   Social Determinants of Health   Financial Resource Strain: Not on file  Food Insecurity: Not on file  Transportation Needs: Not on file  Physical Activity: Not on file  Stress: Not on file  Social Connections: Not on file     Family History: The patient's family history includes Cancer in her father and mother; Heart attack in her mother.  ROS:   Please see the history of present illness.     All other systems reviewed and are negative.  EKGs/Labs/Other Studies Reviewed:    The following studies were reviewed today:   EKG:  EKG is  ordered today.  The ekg ordered today demonstrates normal sinus rhythm, incomplete left bundle branch block..  Recent Labs: 08/27/2020: TSH 0.588 01/14/2021: ALT 10; BNP 21.4; BUN 16; Creatinine, Ser 0.83; Hemoglobin 13.6;  Platelets 245; Potassium 4.3; Sodium 142  Recent Lipid Panel    Component Value Date/Time   CHOL 183 01/14/2021 0922   TRIG 69 01/14/2021 0922   HDL 68 01/14/2021 0922   LDLCALC 102 (H) 01/14/2021 0922    Physical Exam:    VS:  BP 124/80 (BP Location: Left Arm, Patient Position: Sitting, Cuff Size: Large)   Pulse 79   Ht 5\' 1"  (1.549 m)   Wt 242 lb 4 oz (109.9 kg)   LMP  (LMP Unknown)   SpO2 99%   BMI 45.77 kg/m     Wt Readings from Last 3 Encounters:  02/27/21 242 lb 4 oz (109.9 kg)  02/17/21 244 lb 3.2 oz (110.8 kg)  01/17/21 246 lb (111.6 kg)     GEN:  Well nourished, well developed in no acute distress, obese HEENT:  Normal NECK: No JVD; No carotid bruits LYMPHATICS: No lymphadenopathy CARDIAC: RRR, no murmurs, rubs, gallops RESPIRATORY:  Clear to auscultation without rales, wheezing or rhonchi  ABDOMEN: Soft, non-tender, distended MUSCULOSKELETAL:  No edema; No deformity  SKIN: Warm and dry NEUROLOGIC:  Alert and oriented x 3 PSYCHIATRIC:  Normal affect   ASSESSMENT:    1. Dyspnea on exertion   2. Morbid obesity ([place])   3. Sleep apnea, unspecified type     PLAN:    In order of problems listed above:   Dyspnea on exertion, echo with normal systolic and diastolic function, EF 13%.  [place] 01/27/2021 with no evidence for ischemia, low risk scan.  Dyspnea on exertion likely due to morbid obesity. Patient is morbidly obese.  Low-calorie diet, weight loss advised.  Over 5 minutes spent counseling patient.   OSA, CPAP mask nightly advised.     Follow-up in 1 year  Total encounter time 45 minutes  Greater than 50% was spent in counseling and coordination of care with the patient. Time spent counseling patient on the benefits of weight loss, etiology for shortness of breath, diagnosis of sleep apnea and possible management, indications for ABI.   This note was generated in part or whole with voice recognition software. Voice recognition is usually quite accurate but there are transcription errors that can and very often do occur. I apologize for any typographical errors that were not detected and corrected.  Medication Adjustments/Labs and Tests Ordered: Current medicines are reviewed at length with the patient today.  Concerns regarding medicines are outlined above.  Orders Placed This Encounter  Procedures   EKG 12-Lead    No orders of the defined types were placed in this encounter.   Patient Instructions  Medication Instructions:  Your physician recommends that you continue on your current medications as directed. Please refer to the Current Medication list given to you  today.  *If you need a refill on your cardiac medications before your next appointment, please call your pharmacy*   Lab Work: None ordered If you have labs (blood work) drawn today and your tests are completely normal, you will receive your results only by: [place] (if you have MyChart) OR A paper copy in the mail If you have any lab test that is abnormal or we need to change your treatment, we will call you to review the results.   Testing/Procedures: None ordered   Follow-Up: At [facility], you and your health needs are our priority.  As part of our continuing mission to provide you with exceptional heart care, we have created designated Provider Care Teams.  These Care Teams  include your primary Cardiologist (physician) and Advanced Practice Providers (APPs -  Physician Assistants and Nurse Practitioners) who all work together to provide you with the care you need, when you need it.  We recommend signing up for the patient portal called "MyChart".  Sign up information is provided on this After Visit Summary.  MyChart is used to connect with patients for Virtual Visits (Telemedicine).  Patients are able to view lab/test results, encounter notes, upcoming appointments, etc.  Non-urgent messages can be sent to your provider as well.   To learn more about what you can do with MyChart, go to [email or web address].    Your next appointment:   1 year(s)  The format for your next appointment:   In Person  Provider:   Kate Sable, MD   Other Instructions    Signed, Kate Sable, MD  02/27/2021 1:20 PM    [place]

## 2021-02-27 NOTE — Patient Instructions (Signed)

## 2021-03-17 ENCOUNTER — Ambulatory Visit: Payer: Medicare Other | Admitting: Nurse Practitioner

## 2021-03-20 ENCOUNTER — Other Ambulatory Visit: Payer: Self-pay

## 2021-03-20 ENCOUNTER — Ambulatory Visit (INDEPENDENT_AMBULATORY_CARE_PROVIDER_SITE_OTHER): Payer: Medicare Other | Admitting: Podiatry

## 2021-03-20 ENCOUNTER — Encounter: Payer: Self-pay | Admitting: Podiatry

## 2021-03-20 DIAGNOSIS — M722 Plantar fascial fibromatosis: Secondary | ICD-10-CM

## 2021-03-20 NOTE — Progress Notes (Signed)
  Subjective:  Patient ID: Allison Dixon, female    DOB: 12-17-1960,  MRN: [id number]  Chief Complaint  Patient presents with   Plantar Fasciitis    PT stated that she has had some improvement but still has a little pain in the arch of her foot. No new concerns     60 y.o. female presents with the above complaint.  Patient presents for follow-up to left Planter fasciitis.  She is doing about the same.  She is about 50 to 60% improved.  She denies any other acute go she would like to meet discuss next treatment options he still continues to hurt in the heel.  Pain scale is 6 out of 10  Review of Systems: Negative except as noted in the HPI. Denies N/V/F/Ch.  Past Medical History:  Diagnosis Date   Cancer ([place]) 2021   Uterine    COVID    Hypertension    Morbid obesity ([place])    Sleep apnea     Current Outpatient Medications:    amLODipine (NORVASC) 5 MG tablet, Take 1 tablet (5 mg total) by mouth daily., Disp: 180 tablet, Rfl: 3   famotidine (PEPCID) 20 MG tablet, Take 1 tablet (20 mg total) by mouth daily., Disp: 14 tablet, Rfl: 0  Social History   Tobacco Use  Smoking Status Never  Smokeless Tobacco Never    No Known Allergies Objective:  There were no vitals filed for this visit. There is no height or weight on file to calculate BMI. Constitutional Well developed. Well nourished.  Vascular Dorsalis pedis pulses palpable bilaterally. Posterior tibial pulses palpable bilaterally. Capillary refill normal to all digits.  No cyanosis or clubbing noted. Pedal hair growth normal.  Neurologic Normal speech. Oriented to person, place, and time. Epicritic sensation to light touch grossly present bilaterally.  Dermatologic Nails well groomed and normal in appearance. No open wounds. No skin lesions.  Orthopedic: Normal joint ROM without pain or crepitus bilaterally. No visible deformities. Tender to palpation at the calcaneal tuber left. No pain with calcaneal squeeze  left. Ankle ROM diminished range of motion left. Silfverskiold Test: positive left.   Radiographs: Taken and reviewed. No acute fractures or dislocations. No evidence of stress fracture.  Plantar heel spur present. Posterior heel spur present.   Assessment:   1. Plantar fasciitis, left      Plan:  Patient was evaluated and treated and all questions answered.  Plantar Fasciitis, left - XR reviewed as above.  - Educated on icing and stretching. Instructions given.  -I will hold off on any further injection as they have not seem to help. - DME: Cam boot - Pharmacologic management: None -Cam boot was dispensed No follow-ups on file.

## 2021-03-24 ENCOUNTER — Ambulatory Visit (INDEPENDENT_AMBULATORY_CARE_PROVIDER_SITE_OTHER): Payer: Medicare Other | Admitting: Nurse Practitioner

## 2021-03-24 ENCOUNTER — Other Ambulatory Visit: Payer: Self-pay

## 2021-03-24 ENCOUNTER — Encounter: Payer: Self-pay | Admitting: Nurse Practitioner

## 2021-03-24 VITALS — BP 125/83 | HR 75 | Temp 97.9°F | Wt 243.0 lb

## 2021-03-24 DIAGNOSIS — M5441 Lumbago with sciatica, right side: Secondary | ICD-10-CM

## 2021-03-24 DIAGNOSIS — M79672 Pain in left foot: Secondary | ICD-10-CM

## 2021-03-24 DIAGNOSIS — R0602 Shortness of breath: Secondary | ICD-10-CM | POA: Diagnosis not present

## 2021-03-24 DIAGNOSIS — G8929 Other chronic pain: Secondary | ICD-10-CM | POA: Diagnosis not present

## 2021-03-24 MED ORDER — GABAPENTIN 300 MG PO CAPS: 300.0000 mg | ORAL_CAPSULE | Freq: Two times a day (BID) | ORAL | 1 refills | Status: DC

## 2021-03-24 MED ORDER — CYCLOBENZAPRINE HCL 10 MG PO TABS: 10.0000 mg | ORAL_TABLET | Freq: Every evening | ORAL | 0 refills | Status: DC | PRN

## 2021-03-24 NOTE — Assessment & Plan Note (Signed)
Chronic, following with podiatry. She has received 2 steroid injections which has provided some relief but she is still having pain. They gave her a boot to wear a few days ago. Continue collaboration with podiatry.

## 2021-03-24 NOTE — Assessment & Plan Note (Addendum)
Improving, although experiences intermittent shortness of breath and feeling like something is in her throat once in a while. She saw cardiology and had a low risk stress test. Spirometry was normal back in May 2022. Most likely related to deconditioning and obesity. Follow-up if symptoms worsen or for any concerns.

## 2021-03-24 NOTE — Assessment & Plan Note (Addendum)
Chronic, still having 8/10 pain. She has done 2 months of physical therapy and prednisone taper. Lumbar x-ray reviewed and showed severe spondylosis and facet hypertrophy of L4-L5. Will start gabapentin 300mg  at bedtime for 2 days, then 300mg  BID. Will also give flexeril daily prn. Advised her to take the flexeril when she is at home and doesn't have to drive to see how the medication will affect her. Will also order MRI lumbar spine. Continue performing stretches and exercises shown at PT. If still having symptoms next visit, will refer to ortho for further evaluation/treatment. Follow-up in 4-6 weeks.

## 2021-03-24 NOTE — Progress Notes (Signed)
Established Patient Office Visit  Subjective:  Patient ID: Allison Dixon, female    DOB: 12/26/1960  Age: 60 y.o. MRN: [id number]  CC:  Chief Complaint  Patient presents with   Back Pain    Follow up, patient states back pain has not gotten better      HPI Allison Dixon presents for follow-up on back pain. She completed 2 months of PT and has taken the prednisone taper. She has tried OTC tylenol, ibuprofen, heat, and ice.   BACK PAIN  Duration: months Mechanism of injury: unknown Location: R>L and low back Onset: gradual Severity: 8/10 Quality: dull and aching Frequency: constant Radiation: R leg below the knee Aggravating factors: walking Alleviating factors: nothing Status: stable Treatments attempted: rest, ice, heat, APAP, ibuprofen, and physical therapy  Relief with NSAIDs?: no Nighttime pain:  yes Paresthesias / decreased sensation:  no Bowel / bladder incontinence:  no Fevers:  no Dysuria / urinary frequency:  no   Past Medical History:  Diagnosis Date   Cancer ([place]) 2021   Uterine    COVID    Hypertension    Morbid obesity ([place])    Sleep apnea     Past Surgical History:  Procedure Laterality Date   DILATION AND CURETTAGE, DIAGNOSTIC / THERAPEUTIC     EXPLORATORY LAPAROTOMY     for ectopic pregnancy   TOTAL LAPAROSCOPIC HYSTERECTOMY WITH BILATERAL SALPINGO OOPHORECTOMY Bilateral 05/22/2020   Procedure: TOTAL LAPAROSCOPIC HYSTERECTOMY WITH BILATERAL SALPINGO OOPHORECTOMY AND WASHINGS, PERINEAL BIOPSY;  Surgeon: Mellody Drown, MD;  Location: [facility] ORS;  Service: Gynecology;  Laterality: Bilateral;    Family History  Problem Relation Age of Onset   Heart attack Mother    Cancer Mother    Cancer Father     Social History   Socioeconomic History   Marital status: Widowed    Spouse name: Not on file   Number of children: Not on file   Years of education: Not on file   Highest education level: Not on file  Occupational History   Not on file   Tobacco Use   Smoking status: Never   Smokeless tobacco: Never  Vaping Use   Vaping Use: Never used  Substance and Sexual Activity   Alcohol use: Never   Drug use: Never   Sexual activity: Not Currently  Other Topics Concern   Not on file  Social History Narrative   Not on file   Social Determinants of Health   Financial Resource Strain: Not on file  Food Insecurity: Not on file  Transportation Needs: Not on file  Physical Activity: Not on file  Stress: Not on file  Social Connections: Not on file  Intimate Partner Violence: Not on file    Outpatient Medications Prior to Visit  Medication Sig Dispense Refill   amLODipine (NORVASC) 5 MG tablet Take 1 tablet (5 mg total) by mouth daily. 180 tablet 3   famotidine (PEPCID) 20 MG tablet Take 1 tablet (20 mg total) by mouth daily. 14 tablet 0   No facility-administered medications prior to visit.    No Known Allergies  ROS Review of Systems  Constitutional:  Positive for fatigue.  HENT: Negative.    Respiratory:  Positive for shortness of breath (with exertion).   Cardiovascular: Negative.   Gastrointestinal: Negative.   Genitourinary: Negative.   Musculoskeletal:  Positive for back pain.  Neurological: Negative.   Psychiatric/Behavioral: Negative.       Objective:    Physical Exam Vitals and nursing  note reviewed.  Constitutional:      General: She is not in acute distress.    Appearance: Normal appearance. She is obese.  HENT:     Head: Normocephalic and atraumatic.  Eyes:     Conjunctiva/sclera: Conjunctivae normal.  Neck:     Vascular: No carotid bruit.  Cardiovascular:     Rate and Rhythm: Normal rate and regular rhythm.     Pulses: Normal pulses.     Heart sounds: Normal heart sounds.  Pulmonary:     Effort: Pulmonary effort is normal.     Breath sounds: Normal breath sounds.  Musculoskeletal:        General: No tenderness.     Cervical back: Normal range of motion.     Comments: Positive right  straight leg raise  Skin:    General: Skin is warm and dry.  Neurological:     General: No focal deficit present.     Mental Status: She is alert and oriented to person, place, and time.  Psychiatric:        Mood and Affect: Mood normal.        Behavior: Behavior normal.        Thought Content: Thought content normal.        Judgment: Judgment normal.    BP 125/83   Pulse 75   Temp 97.9 F (36.6 C)   Wt 243 lb (110.2 kg)   LMP  (LMP Unknown)   SpO2 98%   BMI 45.91 kg/m  Wt Readings from Last 3 Encounters:  03/24/21 243 lb (110.2 kg)  02/27/21 242 lb 4 oz (109.9 kg)  02/17/21 244 lb 3.2 oz (110.8 kg)     Health Maintenance Due  Topic Date Due   COVID-19 Vaccine (1) Never done   Pneumococcal Vaccine 39-72 Years old (1 - PCV) Never done   Zoster Vaccines- Shingrix (1 of 2) Never done    There are no preventive care reminders to display for this patient.  Lab Results  Component Value Date   TSH 0.588 08/27/2020   Lab Results  Component Value Date   WBC 6.4 01/14/2021   HGB 13.6 01/14/2021   HCT 42.0 01/14/2021   MCV 86 01/14/2021   PLT 245 01/14/2021   Lab Results  Component Value Date   NA 142 01/14/2021   K 4.3 01/14/2021   CO2 25 01/14/2021   GLUCOSE 97 01/14/2021   BUN 16 01/14/2021   CREATININE 0.83 01/14/2021   BILITOT 0.5 01/14/2021   ALKPHOS 93 01/14/2021   AST 15 01/14/2021   ALT 10 01/14/2021   PROT 7.6 01/14/2021   ALBUMIN 3.9 01/14/2021   CALCIUM 9.3 01/14/2021   ANIONGAP 10 08/27/2020   EGFR 82 01/14/2021   Lab Results  Component Value Date   CHOL 183 01/14/2021   Lab Results  Component Value Date   HDL 68 01/14/2021   Lab Results  Component Value Date   LDLCALC 102 (H) 01/14/2021   Lab Results  Component Value Date   TRIG 69 01/14/2021   No results found for: CHOLHDL Lab Results  Component Value Date   HGBA1C 5.6 01/14/2021      Assessment & Plan:   Problem List Items Addressed This Visit       Nervous and  Auditory   Chronic bilateral low back pain with right-sided sciatica - Primary    Chronic, still having 8/10 pain. She has done 2 months of physical therapy and prednisone taper. Lumbar x-ray  reviewed and showed severe spondylosis and facet hypertrophy of L4-L5. Will start gabapentin 374m at bedtime for 2 days, then 3059mBID. Will also give flexeril daily prn. Advised her to take the flexeril when she is at home and doesn't have to drive to see how the medication will affect her. Will also order MRI lumbar spine. Continue performing stretches and exercises shown at PT. If still having symptoms next visit, will refer to ortho for further evaluation/treatment. Follow-up in 4-6 weeks.        Relevant Medications   gabapentin (NEURONTIN) 300 MG capsule   cyclobenzaprine (FLEXERIL) 10 MG tablet   Other Relevant Orders   MR Lumbar Spine Wo Contrast     Other   Shortness of breath    Improving, although experiences intermittent shortness of breath and feeling like something is in her throat once in a while. She saw cardiology and had a low risk stress test. Spirometry was normal back in May 2022. Most likely related to deconditioning and obesity. Follow-up if symptoms worsen or for any concerns.        Left foot pain    Chronic, following with podiatry. She has received 2 steroid injections which has provided some relief but she is still having pain. They gave her a boot to wear a few days ago. Continue collaboration with podiatry.         Meds ordered this encounter  Medications   gabapentin (NEURONTIN) 300 MG capsule    Sig: Take 1 capsule (300 mg total) by mouth 2 (two) times daily. Start 1 capsule at bedtime for 2 days, then increase to 1 capsule twice a day    Dispense:  60 capsule    Refill:  1   cyclobenzaprine (FLEXERIL) 10 MG tablet    Sig: Take 1 tablet (10 mg total) by mouth at bedtime as needed for muscle spasms.    Dispense:  30 tablet    Refill:  0     Follow-up: Return  in about 4 weeks (around 04/21/2021) for 4-6 weeks for back pain .    LaCharyl DancerNP

## 2021-04-05 ENCOUNTER — Ambulatory Visit
Admission: RE | Admit: 2021-04-05 | Discharge: 2021-04-05 | Disposition: A | Discharge status: Home / Self Care | Payer: Medicare Other | Source: Ambulatory Visit | Attending: Nurse Practitioner | Admitting: Nurse Practitioner

## 2021-04-05 ENCOUNTER — Other Ambulatory Visit: Payer: Self-pay

## 2021-04-05 DIAGNOSIS — M5441 Lumbago with sciatica, right side: Secondary | ICD-10-CM | POA: Diagnosis present

## 2021-04-05 DIAGNOSIS — G8929 Other chronic pain: Secondary | ICD-10-CM | POA: Diagnosis present

## 2021-04-17 ENCOUNTER — Ambulatory Visit (INDEPENDENT_AMBULATORY_CARE_PROVIDER_SITE_OTHER): Payer: Medicare Other | Admitting: Podiatry

## 2021-04-17 ENCOUNTER — Other Ambulatory Visit: Payer: Self-pay

## 2021-04-17 ENCOUNTER — Encounter: Payer: Self-pay | Admitting: Podiatry

## 2021-04-17 DIAGNOSIS — M722 Plantar fascial fibromatosis: Secondary | ICD-10-CM | POA: Diagnosis not present

## 2021-04-17 DIAGNOSIS — M216X2 Other acquired deformities of left foot: Secondary | ICD-10-CM | POA: Diagnosis not present

## 2021-04-17 DIAGNOSIS — M21862 Other specified acquired deformities of left lower leg: Secondary | ICD-10-CM

## 2021-04-17 NOTE — Progress Notes (Signed)
Subjective:  Patient ID: Allison Dixon, female    DOB: 1960-12-29,  MRN: [id number]  Chief Complaint  Patient presents with   Plantar Fasciitis    "It's doing, I still have a lot of pain in it, especially when I take this boot off."    60 y.o. female presents with the above complaint.  Patient presents with follow-up of Planter fasciitis.  She states is still very painful.  She feels good in the boot but when she comes out of the boot it starts hurting is not getting any better.  She has failed all conservative treatment options she would like to discuss her surgical options at this time  Review of Systems: Negative except as noted in the HPI. Denies N/V/F/Ch.  Past Medical History:  Diagnosis Date   Cancer ([place]) 2021   Uterine    COVID    Hypertension    Morbid obesity ([place])    Sleep apnea     Current Outpatient Medications:    amLODipine (NORVASC) 5 MG tablet, Take 1 tablet (5 mg total) by mouth daily., Disp: 180 tablet, Rfl: 3   cyclobenzaprine (FLEXERIL) 10 MG tablet, Take 1 tablet (10 mg total) by mouth at bedtime as needed for muscle spasms., Disp: 30 tablet, Rfl: 0   famotidine (PEPCID) 20 MG tablet, Take 1 tablet (20 mg total) by mouth daily., Disp: 14 tablet, Rfl: 0   gabapentin (NEURONTIN) 300 MG capsule, Take 1 capsule (300 mg total) by mouth 2 (two) times daily. Start 1 capsule at bedtime for 2 days, then increase to 1 capsule twice a day, Disp: 60 capsule, Rfl: 1  Social History   Tobacco Use  Smoking Status Never  Smokeless Tobacco Never    No Known Allergies Objective:  There were no vitals filed for this visit. There is no height or weight on file to calculate BMI. Constitutional Well developed. Well nourished.  Vascular Dorsalis pedis pulses palpable bilaterally. Posterior tibial pulses palpable bilaterally. Capillary refill normal to all digits.  No cyanosis or clubbing noted. Pedal hair growth normal.  Neurologic Normal speech. Oriented to person,  place, and time. Epicritic sensation to light touch grossly present bilaterally.  Dermatologic Nails well groomed and normal in appearance. No open wounds. No skin lesions.  Orthopedic: Normal joint ROM without pain or crepitus bilaterally. No visible deformities. Tender to palpation at the calcaneal tuber left. No pain with calcaneal squeeze left. Ankle ROM diminished range of motion left. Silfverskiold Test: positive left.   Radiographs: Taken and reviewed. No acute fractures or dislocations. No evidence of stress fracture.  Plantar heel spur present. Posterior heel spur present.   Assessment:   1. Plantar fasciitis, left   2. Gastrocnemius equinus, left       Plan:  Patient was evaluated and treated and all questions answered.  Plantar Fasciitis, left with underlying gastrocnemius equinus -Clinically it has not gotten any better.  At this time patient still has continuous pain and she is not able to transition out of the boot.  I discussed surgical options as she has failed all conservative treatment options at this time.  I believe she will benefit from endoscopic plantar fasciotomy with gastrocnemius recession.  I discussed my preoperative intraoperative and postoperative plan in extensive detail.  I discussed that she will be weightbearing as tolerated after the surgery to the best of her ability.  She states understanding.  She would like to go ahead and proceed with the surgery despite all the risks.-Informed surgical  risk consent was reviewed and read aloud to the patient.  I reviewed the films.  I have discussed my findings with the patient in great detail.  I have discussed all risks including but not limited to infection, stiffness, scarring, limp, disability, deformity, damage to blood vessels and nerves, numbness, poor healing, need for braces, arthritis, chronic pain, amputation, death.  All benefits and realistic expectations discussed in great detail.  I have made no  promises as to the outcome.  I have provided realistic expectations.  I have offered the patient a 2nd opinion, which they have declined and assured me they preferred to proceed despite the risks   No follow-ups on file.

## 2021-04-24 ENCOUNTER — Other Ambulatory Visit: Payer: Self-pay

## 2021-04-24 DIAGNOSIS — Z1231 Encounter for screening mammogram for malignant neoplasm of breast: Secondary | ICD-10-CM

## 2021-04-28 ENCOUNTER — Encounter: Payer: Self-pay | Admitting: Nurse Practitioner

## 2021-04-28 ENCOUNTER — Ambulatory Visit (INDEPENDENT_AMBULATORY_CARE_PROVIDER_SITE_OTHER): Payer: Medicare Other | Admitting: Nurse Practitioner

## 2021-04-28 ENCOUNTER — Other Ambulatory Visit: Payer: Self-pay

## 2021-04-28 VITALS — BP 115/79 | HR 87 | Temp 98.5°F | Ht 61.02 in | Wt 249.0 lb

## 2021-04-28 DIAGNOSIS — M5441 Lumbago with sciatica, right side: Secondary | ICD-10-CM

## 2021-04-28 DIAGNOSIS — R0989 Other specified symptoms and signs involving the circulatory and respiratory systems: Secondary | ICD-10-CM

## 2021-04-28 DIAGNOSIS — Z1329 Encounter for screening for other suspected endocrine disorder: Secondary | ICD-10-CM | POA: Diagnosis not present

## 2021-04-28 DIAGNOSIS — G8929 Other chronic pain: Secondary | ICD-10-CM

## 2021-04-28 DIAGNOSIS — R198 Other specified symptoms and signs involving the digestive system and abdomen: Secondary | ICD-10-CM | POA: Diagnosis not present

## 2021-04-28 NOTE — Assessment & Plan Note (Signed)
Chronic, ongoing. Continue gabapentin and stretches at home. Will place referral to ortho as her symptoms are ongoing. 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

## 2021-04-28 NOTE — Patient Instructions (Signed)
It was great to see you!   I am placing a referral to orthopedics for your back and the ears, nose and throat doctor for the sensation of feeling something in your throat.   Let's follow-up in 3 months, sooner if you have concerns.  If a referral was placed today, you will be contacted for an appointment. Please note that routine referrals can sometimes take up to 3-4 weeks to process. Please call our office if you haven't heard anything after this time frame.  Take care,  Vance Peper, NP

## 2021-04-28 NOTE — Progress Notes (Signed)
Established Patient Office Visit  Subjective:  Patient ID: Allison Dixon, female    DOB: 05/04/1961  Age: 60 y.o. MRN: [id number]  CC:  Chief Complaint  Patient presents with   Back Pain    Low back pain in middle, patient states that her back pain is worse.    HPI [name] presents for follow-up on low back pain. She has done PT, prednisone, and is currently taking gabapentin. Her pain fluctuates and is worse with bending and moving. MRI showed degenerative disease and stenosis. She is also taking tylenol and using heat/ice with mild relief.   She also endorses the feeling that something is in her throat. This sensation has been going on for several months. She notices it even if she isn't swallowing. Has not gotten worse or better. Denies chest pain, shortness of breath. She said that her family has a history of thyroid disease.    Past Medical History:  Diagnosis Date   Cancer ([place]) 2021   Uterine    COVID    Hypertension    Morbid obesity ([place])    Sleep apnea     Past Surgical History:  Procedure Laterality Date   DILATION AND CURETTAGE, DIAGNOSTIC / THERAPEUTIC     EXPLORATORY LAPAROTOMY     for ectopic pregnancy   TOTAL LAPAROSCOPIC HYSTERECTOMY WITH BILATERAL SALPINGO OOPHORECTOMY Bilateral 05/22/2020   Procedure: TOTAL LAPAROSCOPIC HYSTERECTOMY WITH BILATERAL SALPINGO OOPHORECTOMY AND WASHINGS, PERINEAL BIOPSY;  Surgeon: Mellody Drown, MD;  Location: [facility] ORS;  Service: Gynecology;  Laterality: Bilateral;    Family History  Problem Relation Age of Onset   Heart attack Mother    Cancer Mother    Cancer Father     Social History   Socioeconomic History   Marital status: Widowed    Spouse name: Not on file   Number of children: Not on file   Years of education: Not on file   Highest education level: Not on file  Occupational History   Not on file  Tobacco Use   Smoking status: Never   Smokeless tobacco: Never  Vaping Use   Vaping Use: Never used   Substance and Sexual Activity   Alcohol use: Never   Drug use: Never   Sexual activity: Not Currently  Other Topics Concern   Not on file  Social History Narrative   Not on file   Social Determinants of Health   Financial Resource Strain: Not on file  Food Insecurity: Not on file  Transportation Needs: Not on file  Physical Activity: Not on file  Stress: Not on file  Social Connections: Not on file  Intimate Partner Violence: Not on file    Outpatient Medications Prior to Visit  Medication Sig Dispense Refill   cyclobenzaprine (FLEXERIL) 10 MG tablet Take 1 tablet (10 mg total) by mouth at bedtime as needed for muscle spasms. 30 tablet 0   famotidine (PEPCID) 20 MG tablet Take 1 tablet (20 mg total) by mouth daily. 14 tablet 0   gabapentin (NEURONTIN) 300 MG capsule Take 1 capsule (300 mg total) by mouth 2 (two) times daily. Start 1 capsule at bedtime for 2 days, then increase to 1 capsule twice a day 60 capsule 1   amLODipine (NORVASC) 5 MG tablet Take 1 tablet (5 mg total) by mouth daily. 180 tablet 3   No facility-administered medications prior to visit.    No Known Allergies  ROS Review of Systems  Constitutional:  Positive for fatigue.  HENT:  Feels like something stuck in her throat   Respiratory: Negative.    Cardiovascular: Negative.   Gastrointestinal: Negative.   Genitourinary: Negative.   Musculoskeletal:  Positive for arthralgias (left foot) and back pain.  Neurological: Negative.      Objective:    Physical Exam Vitals and nursing note reviewed.  Constitutional:      General: She is not in acute distress.    Appearance: Normal appearance.  HENT:     Head: Normocephalic and atraumatic.  Eyes:     Conjunctiva/sclera: Conjunctivae normal.  Cardiovascular:     Rate and Rhythm: Normal rate and regular rhythm.     Pulses: Normal pulses.     Heart sounds: Normal heart sounds.  Pulmonary:     Effort: Pulmonary effort is normal.     Breath  sounds: Normal breath sounds.  Musculoskeletal:     Cervical back: Normal range of motion and neck supple. No tenderness.  Lymphadenopathy:     Cervical: No cervical adenopathy.  Skin:    General: Skin is warm and dry.  Neurological:     General: No focal deficit present.     Mental Status: She is alert and oriented to person, place, and time.  Psychiatric:        Mood and Affect: Mood normal.        Behavior: Behavior normal.        Thought Content: Thought content normal.        Judgment: Judgment normal.    BP 115/79   Pulse 87   Temp 98.5 F (36.9 C) (Oral)   Ht 5' 1.02" (1.55 m)   Wt 249 lb (112.9 kg)   LMP  (LMP Unknown)   SpO2 97%   BMI 47.01 kg/m  Wt Readings from Last 3 Encounters:  04/28/21 249 lb (112.9 kg)  03/24/21 243 lb (110.2 kg)  02/27/21 242 lb 4 oz (109.9 kg)     Health Maintenance Due  Topic Date Due   INFLUENZA VACCINE  04/07/2021    There are no preventive care reminders to display for this patient.  Lab Results  Component Value Date   TSH 0.588 08/27/2020   Lab Results  Component Value Date   WBC 6.4 01/14/2021   HGB 13.6 01/14/2021   HCT 42.0 01/14/2021   MCV 86 01/14/2021   PLT 245 01/14/2021   Lab Results  Component Value Date   NA 142 01/14/2021   K 4.3 01/14/2021   CO2 25 01/14/2021   GLUCOSE 97 01/14/2021   BUN 16 01/14/2021   CREATININE 0.83 01/14/2021   BILITOT 0.5 01/14/2021   ALKPHOS 93 01/14/2021   AST 15 01/14/2021   ALT 10 01/14/2021   PROT 7.6 01/14/2021   ALBUMIN 3.9 01/14/2021   CALCIUM 9.3 01/14/2021   ANIONGAP 10 08/27/2020   EGFR 82 01/14/2021   Lab Results  Component Value Date   CHOL 183 01/14/2021   Lab Results  Component Value Date   HDL 68 01/14/2021   Lab Results  Component Value Date   LDLCALC 102 (H) 01/14/2021   Lab Results  Component Value Date   TRIG 69 01/14/2021   No results found for: CHOLHDL Lab Results  Component Value Date   HGBA1C 5.6 01/14/2021      Assessment &  Plan:   Problem List Items Addressed This Visit   None   No orders of the defined types were placed in this encounter.   Follow-up: No follow-ups on file.  Charyl Dancer, NP

## 2021-04-29 LAB — TSH: TSH: 1.03 u[IU]/mL (ref 0.450–4.500)

## 2021-04-29 NOTE — Addendum Note (Signed)
Addended by: Vance Peper A on: 04/29/2021 08:13 AM   Modules accepted: Orders

## 2021-05-19 ENCOUNTER — Other Ambulatory Visit: Payer: Self-pay | Admitting: Podiatry

## 2021-05-19 ENCOUNTER — Encounter: Payer: Self-pay | Admitting: Podiatry

## 2021-05-19 DIAGNOSIS — M722 Plantar fascial fibromatosis: Secondary | ICD-10-CM | POA: Diagnosis not present

## 2021-05-19 DIAGNOSIS — M216X2 Other acquired deformities of left foot: Secondary | ICD-10-CM | POA: Diagnosis not present

## 2021-05-19 MED ORDER — IBUPROFEN 800 MG PO TABS: 800.0000 mg | ORAL_TABLET | Freq: Four times a day (QID) | ORAL | 1 refills | Status: DC | PRN

## 2021-05-19 MED ORDER — OXYCODONE-ACETAMINOPHEN 5-325 MG PO TABS: 1.0000 | ORAL_TABLET | ORAL | 0 refills | Status: AC | PRN

## 2021-05-27 ENCOUNTER — Other Ambulatory Visit: Payer: Self-pay

## 2021-05-27 ENCOUNTER — Ambulatory Visit (INDEPENDENT_AMBULATORY_CARE_PROVIDER_SITE_OTHER): Payer: Medicare Other | Admitting: Podiatry

## 2021-05-27 DIAGNOSIS — M722 Plantar fascial fibromatosis: Secondary | ICD-10-CM

## 2021-05-27 DIAGNOSIS — M21862 Other specified acquired deformities of left lower leg: Secondary | ICD-10-CM

## 2021-05-27 DIAGNOSIS — Z9889 Other specified postprocedural states: Secondary | ICD-10-CM

## 2021-05-27 DIAGNOSIS — M216X2 Other acquired deformities of left foot: Secondary | ICD-10-CM

## 2021-05-27 NOTE — Progress Notes (Signed)
  Subjective:  Patient ID: Allison Dixon, female    DOB: [birth year],  MRN: [id number]  Chief Complaint  Patient presents with   Routine Post Benard Rink 9.12.22    DOS: 05/19/2021 Procedure: Left EPF and gastrocnemius recession  60 y.o. female returns for post-op check.  Patient states she is doing well.  She still has pain while ambulating.  She states she has been walking with the boot on.  She has been taking her pain medication.  She denies any other acute complaints.  Review of Systems: Negative except as noted in the HPI. Denies N/V/F/Ch.  Past Medical History:  Diagnosis Date   Cancer ([place]) 2021   Uterine    COVID    Hypertension    Morbid obesity ([place])    Sleep apnea     Current Outpatient Medications:    amLODipine (NORVASC) 5 MG tablet, Take 1 tablet (5 mg total) by mouth daily., Disp: 180 tablet, Rfl: 3   cyclobenzaprine (FLEXERIL) 10 MG tablet, Take 1 tablet (10 mg total) by mouth at bedtime as needed for muscle spasms., Disp: 30 tablet, Rfl: 0   famotidine (PEPCID) 20 MG tablet, Take 1 tablet (20 mg total) by mouth daily., Disp: 14 tablet, Rfl: 0   gabapentin (NEURONTIN) 300 MG capsule, Take 1 capsule (300 mg total) by mouth 2 (two) times daily. Start 1 capsule at bedtime for 2 days, then increase to 1 capsule twice a day, Disp: 60 capsule, Rfl: 1   ibuprofen (ADVIL) 800 MG tablet, Take 1 tablet (800 mg total) by mouth every 6 (six) hours as needed., Disp: 60 tablet, Rfl: 1  Social History   Tobacco Use  Smoking Status Never  Smokeless Tobacco Never    No Known Allergies Objective:  There were no vitals filed for this visit. There is no height or weight on file to calculate BMI. Constitutional Well developed. Well nourished.  Vascular Foot warm and well perfused. Capillary refill normal to all digits.   Neurologic Normal speech. Oriented to person, place, and time. Epicritic sensation to light touch grossly present bilaterally.  Dermatologic Skin healing  well without signs of infection. Skin edges well coapted without signs of infection.  Orthopedic: Tenderness to palpation noted about the surgical site.   Radiographs: None Assessment:   1. Plantar fasciitis, left   2. Gastrocnemius equinus, left   3. Status post foot surgery    Plan:  Patient was evaluated and treated and all questions answered.  S/p foot surgery right -Progressing as expected post-operatively. -XR: None -WB Status: Weightbearing as tolerated in cam boot left -Sutures: Intact.  No clinical signs of dehiscence noted no complication noted. -Medications: None -Foot redressed.  No follow-ups on file.

## 2021-05-31 ENCOUNTER — Ambulatory Visit (INDEPENDENT_AMBULATORY_CARE_PROVIDER_SITE_OTHER): Payer: Medicare Other

## 2021-05-31 DIAGNOSIS — Z0000 Encounter for general adult medical examination without abnormal findings: Secondary | ICD-10-CM

## 2021-05-31 NOTE — Progress Notes (Signed)
Subjective:   Allison Dixon is a 60 y.o. female who presents for an Initial Medicare Annual Wellness Visit. I connected with  [name] on 05/31/21 by a audio enabled telemedicine application and verified that I am speaking with the correct person using two identifiers.   I discussed the limitations of evaluation and management by telemedicine. The patient expressed understanding and agreed to proceed.   Location of the patient:home Location of provider: office Ar[facility] Sieving completed visit  Completed by: Glenetta Hew  Review of Systems    Defer to PCP       Objective:    Today's Vitals   05/31/21 0923  PainSc: 7    There is no height or weight on file to calculate BMI.  Advanced Directives 12/25/2020 08/27/2020 06/25/2020 05/22/2020 05/21/2020 05/15/2020 07/28/2019  Does Patient Have a Medical Advance Directive? No No No No No No No  Would patient like information on creating a medical advance directive? - No - Patient declined No - Patient declined No - Patient declined Yes (MAU/Ambulatory/Procedural Areas - Information given) Yes (MAU/Ambulatory/Procedural Areas - Information given) No - Patient declined    Current Medications (verified) Outpatient Encounter Medications as of 05/31/2021  Medication Sig   cyclobenzaprine (FLEXERIL) 10 MG tablet Take 1 tablet (10 mg total) by mouth at bedtime as needed for muscle spasms.   famotidine (PEPCID) 20 MG tablet Take 1 tablet (20 mg total) by mouth daily.   ibuprofen (ADVIL) 800 MG tablet Take 1 tablet (800 mg total) by mouth every 6 (six) hours as needed.   amLODipine (NORVASC) 5 MG tablet Take 1 tablet (5 mg total) by mouth daily.   [DISCONTINUED] gabapentin (NEURONTIN) 300 MG capsule Take 1 capsule (300 mg total) by mouth 2 (two) times daily. Start 1 capsule at bedtime for 2 days, then increase to 1 capsule twice a day   No facility-administered encounter medications on file as of 05/31/2021.    Allergies (verified) Patient has  no known allergies.   History: Past Medical History:  Diagnosis Date   Cancer ([place]) 2021   Uterine    COVID    Hypertension    Morbid obesity ([place])    Sleep apnea    Past Surgical History:  Procedure Laterality Date   DILATION AND CURETTAGE, DIAGNOSTIC / THERAPEUTIC     EXPLORATORY LAPAROTOMY     for ectopic pregnancy   TOTAL LAPAROSCOPIC HYSTERECTOMY WITH BILATERAL SALPINGO OOPHORECTOMY Bilateral 05/22/2020   Procedure: TOTAL LAPAROSCOPIC HYSTERECTOMY WITH BILATERAL SALPINGO OOPHORECTOMY AND WASHINGS, PERINEAL BIOPSY;  Surgeon: Mellody Drown, MD;  Location: [facility] ORS;  Service: Gynecology;  Laterality: Bilateral;   Family History  Problem Relation Age of Onset   Heart attack Mother    Cancer Mother    Cancer Father    Social History   Socioeconomic History   Marital status: Widowed    Spouse name: Not on file   Number of children: Not on file   Years of education: Not on file   Highest education level: Not on file  Occupational History   Not on file  Tobacco Use   Smoking status: Never   Smokeless tobacco: Never  Vaping Use   Vaping Use: Never used  Substance and Sexual Activity   Alcohol use: Never   Drug use: Never   Sexual activity: Not Currently  Other Topics Concern   Not on file  Social History Narrative   Not on file   Social Determinants of Health   Financial  Resource Strain: Low Risk    Difficulty of Paying Living Expenses: Not hard at all  Food Insecurity: No Food Insecurity   Worried About Charity fundraiser in the Last Year: Never true   Ran Out of Food in the Last Year: Never true  Transportation Needs: No Transportation Needs   Lack of Transportation (Medical): No   Lack of Transportation (Non-Medical): No  Physical Activity: Insufficiently Active   Days of Exercise per Week: 2 days   Minutes of Exercise per Session: 10 min  Stress: No Stress Concern Present   Feeling of Stress : Not at all  Social Connections: Socially Isolated    Frequency of Communication with Friends and Family: Once a week   Frequency of Social Gatherings with Friends and Family: Once a week   Attends Religious Services: 1 to 4 times per year   Active Member of [employer] or Organizations: No   Attends Archivist Meetings: Never   Marital Status: Widowed    Tobacco Counseling Counseling given: Not Answered   Clinical Intake:  Pre-visit preparation completed: Yes  Pain : 0-10 Pain Score: 7  Pain Type: Acute pain Pain Location: Foot Pain Orientation: Left Pain Descriptors / Indicators: Aching, Constant, Discomfort, Jabbing Pain Onset: Other (comment) (patient had surgery for plantar fasciitis) Pain Frequency: Constant Pain Relieving Factors: pain medication-elevation Effect of Pain on Daily Activities: affects daily living  Pain Relieving Factors: pain medication-elevation  Nutritional Risks: None Diabetes: No  How often do you need to have someone help you when you read instructions, pamphlets, or other written materials from your doctor or pharmacy?: 1 - Never What is the last grade level you completed in school?: 9th  Diabetic?Pre DM  Interpreter Needed?: No  Information entered by :: Linus Galas   Activities of Daily Living No flowsheet data found.  Patient Care Team: Charyl Dancer, NP as PCP - General (Internal Medicine) Kate Sable, MD as PCP - Cardiology (Cardiology) Kate Sable, MD as Consulting Physician (Cardiology) Clent Jacks, RN as Oncology Nurse Navigator Mellody Drown, MD as Referring Physician (Obstetrics)  Indicate any recent Medical Services you may have received from other than [facility] providers in the past year (date may be approximate).     Assessment:   This is a routine wellness examination for [place].  Hearing/Vision screen No results found.  Dietary issues and exercise activities discussed: Current Exercise Habits: The patient does not participate in regular  exercise at present, Exercise limited by: orthopedic condition(s) (patient limited right now due to syurgery)   Goals Addressed   None    Depression Screen PHQ 2/9 Scores 05/31/2021 05/31/2021 01/14/2021  PHQ - 2 Score 1 1 0  PHQ- 9 Score 11 - 5    Fall Risk Fall Risk  05/31/2021 04/28/2021 01/14/2021  Falls in the past year? 0 0 0  Number falls in past yr: 0 0 0  Injury with Fall? 0 0 0  Risk for fall due to : No Fall Risks No Fall Risks -  Follow up Falls evaluation completed Falls evaluation completed Falls evaluation completed    [place]:  Any stairs in or around the home? Yes  If so, are there any without handrails? Yes  Home free of loose throw rugs in walkways, pet beds, electrical cords, etc? No  Adequate lighting in your home to reduce risk of falls? Yes   ASSISTIVE DEVICES UTILIZED TO PREVENT FALLS:  Life alert? No  Use of a cane, [place] Ray[place] or w/c? No  Grab bars in the bathroom? No  Shower chair or bench in shower? No  Elevated toilet seat or a handicapped toilet? No   TIMED UP AND GO:  Was the test performed?  N/A .  Length of time to ambulate 10 feet: N/A sec.    Cognitive Function:     6CIT Screen 05/31/2021  What Year? 0 points  What month? 0 points  What time? 0 points  Count back from 20 0 points  Months in reverse 0 points  Repeat phrase 0 points  Total Score 0    Immunizations  There is no immunization history on file for this patient.  TDAP status: Up to date  Flu Vaccine status: Due, Education has been provided regarding the importance of this vaccine. Advised may receive this vaccine at local pharmacy or Health Dept. Aware to provide a copy of the vaccination record if obtained from local pharmacy or Health Dept. Verbalized acceptance and understanding.  Pneumococcal vaccine status: Declined,  Education has been provided regarding the importance of this vaccine but patient still declined. Advised may receive  this vaccine at local pharmacy or Health Dept. Aware to provide a copy of the vaccination record if obtained from local pharmacy or Health Dept. Verbalized acceptance and understanding.   Covid-19 vaccine status: Declined, Education has been provided regarding the importance of this vaccine but patient still declined. Advised may receive this vaccine at local pharmacy or Health Dept.or vaccine clinic. Aware to provide a copy of the vaccination record if obtained from local pharmacy or Health Dept. Verbalized acceptance and understanding.  Qualifies for Shingles Vaccine? Yes   Zostavax completed Yes   Shingrix Completed?: No.    Education has been provided regarding the importance of this vaccine. Patient has been advised to call insurance company to determine out of pocket expense if they have not yet received this vaccine. Advised may also receive vaccine at local pharmacy or Health Dept. Verbalized acceptance and understanding.  Screening Tests Health Maintenance  Topic Date Due   COVID-19 Vaccine (1) Never done   INFLUENZA VACCINE  Never done   Zoster Vaccines- Shingrix (1 of 2) 07/29/2021 (Originally 04/03/1982)   MAMMOGRAM  01/14/2022 (Originally 04/03/2013)   COLONOSCOPY (Pts 45-70yrs Insurance coverage will need to be confirmed)  01/14/2022 (Originally 04/03/2008)   TETANUS/TDAP  01/14/2022 (Originally 04/03/1982)   Hepatitis C Screening  01/14/2022 (Originally 04/03/1981)   HIV Screening  01/14/2022 (Originally 04/03/1978)   PAP SMEAR-Modifier  04/27/2023   HPV VACCINES  Aged Out    Health Maintenance  Health Maintenance Due  Topic Date Due   COVID-19 Vaccine (1) Never done   INFLUENZA VACCINE  Never done    Patient has declined colonoscopy   Mammogram status: declined   Lung Cancer Screening: (Low Dose CT Chest recommended if Age 42-80 years, 30 pack-year currently smoking OR have quit w/in 15years.) does not qualify.   Lung Cancer Screening Referral: N/A  Additional  Screening:  Hepatitis C Screening: Declined  Vision Screening: Recommended annual ophthalmology exams for early detection of glaucoma and other disorders of the eye. Is the patient up to date with their annual eye exam?  No  Who is the provider or what is the name of the office in which the patient attends annual eye exams? declined If pt is not established with a provider, would they like to be referred to a provider to establish care? Yes .   Dental Screening:  Recommended annual dental exams for proper oral hygiene  Community Resource Referral / Chronic Care Management: CRR required this visit?  No   CCM required this visit?  No      Plan:     I have personally reviewed and noted the following in the patient's chart:   Medical and social history Use of alcohol, tobacco or illicit drugs  Current medications and supplements including opioid prescriptions. Patient is currently taking opioid prescriptions. Information provided to patient regarding non-opioid alternatives. Patient advised to discuss non-opioid treatment plan with their provider. Functional ability and status Nutritional status Physical activity Advanced directives List of other physicians Hospitalizations, surgeries, and ER visits in previous 12 months Vitals Screenings to include cognitive, depression, and falls Referrals and appointments  In addition, I have reviewed and discussed with patient certain preventive protocols, quality metrics, and best practice recommendations. A written personalized care plan for preventive services as well as general preventive health recommendations were provided to patient.     Linus Galas, [place]   05/31/2021   Nurse Notes: Non face to face 60 minutes   Ms. Kilmer , Thank you for taking time to come for your Medicare Wellness Visit. I appreciate your ongoing commitment to your health goals. Please review the following plan we discussed and let me know if I can assist you in  the future.   These are the goals we discussed:  Goals   None     This is a list of the screening recommended for you and due dates:  Health Maintenance  Topic Date Due   COVID-19 Vaccine (1) Never done   Flu Shot  Never done   Zoster (Shingles) Vaccine (1 of 2) 07/29/2021*   Mammogram  01/14/2022*   Colon Cancer Screening  01/14/2022*   Tetanus Vaccine  01/14/2022*   Hepatitis C Screening: USPSTF Recommendation to screen - Ages 18-79 yo.  01/14/2022*   HIV Screening  01/14/2022*   Pap Smear  04/27/2023   HPV Vaccine  Aged Out  *Topic was postponed. The date shown is not the original due date.

## 2021-05-31 NOTE — Patient Instructions (Signed)
Health Maintenance, Female Adopting a healthy lifestyle and getting preventive care are important in promoting health and wellness. Ask your health care provider about: The right schedule for you to have regular tests and exams. Things you can do on your own to prevent diseases and keep yourself healthy. What should I know about diet, weight, and exercise? Eat a healthy diet  Eat a diet that includes plenty of vegetables, fruits, low-fat dairy products, and lean protein. Do not eat a lot of foods that are high in solid fats, added sugars, or sodium. Maintain a healthy weight Body mass index (BMI) is used to identify weight problems. It estimates body fat based on height and weight. Your health care provider can help determine your BMI and help you achieve or maintain a healthy weight. Get regular exercise Get regular exercise. This is one of the most important things you can do for your health. Most adults should: Exercise for at least 150 minutes each week. The exercise should increase your heart rate and make you sweat (moderate-intensity exercise). Do strengthening exercises at least twice a week. This is in addition to the moderate-intensity exercise. Spend less time sitting. Even light physical activity can be beneficial. Watch cholesterol and blood lipids Have your blood tested for lipids and cholesterol at 58 years of age, then have this test every 5 years. Have your cholesterol levels checked more often if: Your lipid or cholesterol levels are high. You are older than 58 years of age. You are at high risk for heart disease. What should I know about cancer screening? Depending on your health history and family history, you may need to have cancer screening at various ages. This may include screening for: Breast cancer. Cervical cancer. Colorectal cancer. Skin cancer. Lung cancer. What should I know about heart disease, diabetes, and high blood pressure? Blood pressure and heart  disease High blood pressure causes heart disease and increases the risk of stroke. This is more likely to develop in people who have high blood pressure readings, are of African descent, or are overweight. Have your blood pressure checked: Every 3-5 years if you are 18-39 years of age. Every year if you are 40 years old or older. Diabetes Have regular diabetes screenings. This checks your fasting blood sugar level. Have the screening done: Once every three years after age 40 if you are at a normal weight and have a low risk for diabetes. More often and at a younger age if you are overweight or have a high risk for diabetes. What should I know about preventing infection? Hepatitis B If you have a higher risk for hepatitis B, you should be screened for this virus. Talk with your health care provider to find out if you are at risk for hepatitis B infection. Hepatitis C Testing is recommended for: Everyone born from 1945 through 1965. Anyone with known risk factors for hepatitis C. Sexually transmitted infections (STIs) Get screened for STIs, including gonorrhea and chlamydia, if: You are sexually active and are younger than 58 years of age. You are older than 58 years of age and your health care provider tells you that you are at risk for this type of infection. Your sexual activity has changed since you were last screened, and you are at increased risk for chlamydia or gonorrhea. Ask your health care provider if you are at risk. Ask your health care provider about whether you are at high risk for HIV. Your health care provider may recommend a prescription medicine   to help prevent HIV infection. If you choose to take medicine to prevent HIV, you should first get tested for HIV. You should then be tested every 3 months for as long as you are taking the medicine. Pregnancy If you are about to stop having your period (premenopausal) and you may become pregnant, seek counseling before you get  pregnant. Take 400 to 800 micrograms (mcg) of folic acid every day if you become pregnant. Ask for birth control (contraception) if you want to prevent pregnancy. Osteoporosis and menopause Osteoporosis is a disease in which the bones lose minerals and strength with aging. This can result in bone fractures. If you are 65 years old or older, or if you are at risk for osteoporosis and fractures, ask your health care provider if you should: Be screened for bone loss. Take a calcium or vitamin D supplement to lower your risk of fractures. Be given hormone replacement therapy (HRT) to treat symptoms of menopause. Follow these instructions at home: Lifestyle Do not use any products that contain nicotine or tobacco, such as cigarettes, e-cigarettes, and chewing tobacco. If you need help quitting, ask your health care provider. Do not use street drugs. Do not share needles. Ask your health care provider for help if you need support or information about quitting drugs. Alcohol use Do not drink alcohol if: Your health care provider tells you not to drink. You are pregnant, may be pregnant, or are planning to become pregnant. If you drink alcohol: Limit how much you use to 0-1 drink a day. Limit intake if you are breastfeeding. Be aware of how much alcohol is in your drink. In the [place] one drink equals one 12 oz bottle of beer (355 mL), one 5 oz glass of wine (148 mL), or one 1 oz glass of hard liquor (44 mL). General instructions Schedule regular health, dental, and eye exams. Stay current with your vaccines. Tell your health care provider if: You often feel depressed. You have ever been abused or do not feel safe at home. Summary Adopting a healthy lifestyle and getting preventive care are important in promoting health and wellness. Follow your health care provider's instructions about healthy diet, exercising, and getting tested or screened for diseases. Follow your health care provider's  instructions on monitoring your cholesterol and blood pressure. This information is not intended to replace advice given to you by your health care provider. Make sure you discuss any questions you have with your health care provider. Document Revised: 11/01/2020 Document Reviewed: 08/17/2018 Elsevier Patient Education  2022 Elsevier Inc.  

## 2021-06-02 ENCOUNTER — Ambulatory Visit (INDEPENDENT_AMBULATORY_CARE_PROVIDER_SITE_OTHER): Payer: Medicare Other | Admitting: Primary Care

## 2021-06-02 ENCOUNTER — Encounter: Payer: Self-pay | Admitting: Primary Care

## 2021-06-02 ENCOUNTER — Other Ambulatory Visit: Payer: Self-pay

## 2021-06-02 VITALS — BP 138/80 | HR 71 | Temp 97.7°F | Ht 62.0 in | Wt 253.6 lb

## 2021-06-02 DIAGNOSIS — G4733 Obstructive sleep apnea (adult) (pediatric): Secondary | ICD-10-CM | POA: Diagnosis not present

## 2021-06-02 NOTE — Patient Instructions (Addendum)
Recommendations: Try biotene mouth wash Adjust humidification on machine higher If still having issues with dry mouth we may consider trying nasal mask with chin strap   Orders: Change CPAP auto titration 5-15 cm h20   Follow-up: 6 months with Beth NP (video visit or in-person)  CPAP and BPAP Information CPAP and BPAP (also called BiPAP) are methods that use air pressure to keep your airways open and to help you breathe well. CPAP and BPAP use different amounts of pressure. Your health care provider will tell you whether CPAP or BPAP would be more helpful for you. CPAP stands for "continuous positive airway pressure." With CPAP, the amount of pressure stays the same while you breathe in (inhale) and out (exhale). BPAP stands for "bi-level positive airway pressure." With BPAP, the amount of pressure will be higher when you inhale and lower when you exhale. This allows you to take larger breaths. CPAP or BPAP may be used in the hospital, or your health care provider may want you to use it at home. You may need to have a sleep study before your health care provider can order a machine for you to use at home. What are the advantages? CPAP or BPAP can be helpful if you have: Sleep apnea. Chronic obstructive pulmonary disease (COPD). Heart failure. Medical conditions that cause muscle weakness, including muscular dystrophy or amyotrophic lateral sclerosis (ALS). Other problems that cause breathing to be shallow, weak, abnormal, or difficult. CPAP and BPAP are most commonly used for obstructive sleep apnea (OSA) to keep the airways from collapsing when the muscles relax during sleep. What are the risks? Generally, this is a safe treatment. However, problems may occur, including: Irritated skin or skin sores if the mask does not fit properly. Dry or stuffy nose or nosebleeds. Dry mouth. Feeling gassy or bloated. Sinus or lung infection if the equipment is not cleaned properly. When should  CPAP or BPAP be used? In most cases, the mask only needs to be worn during sleep. Generally, the mask needs to be worn throughout the night and during any daytime naps. People with certain medical conditions may also need to wear the mask at other times, such as when they are awake. Follow instructions from your health care provider about when to use the machine. What happens during CPAP or BPAP? Both CPAP and BPAP are provided by a small machine with a flexible plastic tube that attaches to a plastic mask that you wear. Air is blown through the mask into your nose or mouth. The amount of pressure that is used to blow the air can be adjusted on the machine. Your health care provider will set the pressure setting and help you find the best mask for you. Tips for using the mask Because the mask needs to be snug, some people feel trapped or closed-in (claustrophobic) when first using the mask. If you feel this way, you may need to get used to the mask. One way to do this is to hold the mask loosely over your nose or mouth and then gradually apply the mask more snugly. You can also gradually increase the amount of time that you use the mask. Masks are available in various types and sizes. If your mask does not fit well, talk with your health care provider about getting a different one. Some common types of masks include: Full face masks, which fit over the mouth and nose. Nasal masks, which fit over the nose. Nasal pillow or prong masks, which fit  into the nostrils. If you are using a mask that fits over your nose and you tend to breathe through your mouth, a chin strap may be applied to help keep your mouth closed. Use a skin barrier to protect your skin as told by your health care provider. Some CPAP and BPAP machines have alarms that may sound if the mask comes off or develops a leak. If you have trouble with the mask, it is very important that you talk with your health care provider about finding a way  to make the mask easier to tolerate. Do not stop using the mask. There could be a negative impact on your health if you stop using the mask. Tips for using the machine Place your CPAP or BPAP machine on a secure table or stand near an electrical outlet. Know where the on/off switch is on the machine. Follow instructions from your health care provider about how to set the pressure on your machine and when you should use it. Do not eat or drink while the CPAP or BPAP machine is on. Food or fluids could get pushed into your lungs by the pressure of the CPAP or BPAP. For home use, CPAP and BPAP machines can be rented or purchased through home health care companies. Many different brands of machines are available. Renting a machine before purchasing may help you find out which particular machine works well for you. Your health insurance company may also decide which machine you may get. Keep the CPAP or BPAP machine and attachments clean. Ask your health care provider for specific instructions. Check the humidifier if you have a dry stuffy nose or nosebleeds. Make sure it is working correctly. Follow these instructions at home: Take over-the-counter and prescription medicines only as told by your health care provider. Ask if you can take sinus medicine if your sinuses are blocked. Do not use any products that contain nicotine or tobacco. These products include cigarettes, chewing tobacco, and vaping devices, such as e-cigarettes. If you need help quitting, ask your health care provider. Keep all follow-up visits. This is important. Contact a health care provider if: You have redness or pressure sores on your head, face, mouth, or nose from the mask or head gear. You have trouble using the CPAP or BPAP machine. You cannot tolerate wearing the CPAP or BPAP mask. Someone tells you that you snore even when wearing your CPAP or BPAP. Get help right away if: You have trouble breathing. You feel  confused. Summary CPAP and BPAP are methods that use air pressure to keep your airways open and to help you breathe well. If you have trouble with the mask, it is very important that you talk with your health care provider about finding a way to make the mask easier to tolerate. Do not stop using the mask. There could be a negative impact to your health if you stop using the mask. Follow instructions from your health care provider about when to use the machine. This information is not intended to replace advice given to you by your health care provider. Make sure you discuss any questions you have with your health care provider. Document Revised: 08/02/2020 Document Reviewed: 08/02/2020 Elsevier Patient Education  2022 [employer].

## 2021-06-02 NOTE — Progress Notes (Signed)
@Patient  ID: [name], female    DOB: 1961/02/09, 60 y.o.   MRN: [id number]  No chief complaint on file.   Referring provider: Charyl Dancer, NP  HPI: 60 year old female, never smoked.  Past medical history significant for hypertension, dyspnea, obstructive sleep apnea. Patient of Dr. Halford Chessman, seen for initial consult on 01/25/2020.  She is on no current medication.  Previous [facility] pulmonary encounters: 8/31/2021Ok Edwards, NP Volanda Napoleon  Patient presents today to review sleep study results.  She originally saw Dr. Halford Chessman back in May 2021 with reports of snoring and daytime somnolence.  Patient completed split-night sleep study on 04/11/2020 at [facility] which showed moderate obstructive sleep apnea with AHI 16.3/hour and SPO2 low 75%.  Recommended CPAP therapy with pressure setting of 10 cm H2O.  We discussed sleep study results today and treatment options.  Agreeing to initiate CPAP therapy.  Continue to encourage weight loss and avoid driving if experiencing excessive somnolence.  Patient continues to experience some exertional dyspnea.  She had a normal chest x-ray in May.  Family asking for temporary handicap placard as walking long distance causes her to get out of breath.  Patient had PFTs at [facility] that showed no obvious obstructive airway disease or restrictive lung disease.  She has never smoked.  06/02/2021- Interim hx  Patient presents today for 1 year follow-up.  Split-night sleep study in August 2021 showed moderate obstructive sleep apnea AHI 16.3 an hour with SPO2 low 75%.  She was started on CPAP at 10 cm H2O. She is doing well, no acute complaints. She is 93% compliant with CPAP use > 4 hours. Feels she is not getting enough air with CPAP use. She uses full face mask. Her mouth gets dry at night. No significant change in daytime sleepiness. Epworth 4/24.   Airview download 04/30/21-05/29/21 Usage 30/30 days; 28 days (93%) > 4 hours Average usage 7 hours 26 mins Pressure 10cm h20 Airleaks 0.0L/min  (95%) AHI 0.3    No Known Allergies   There is no immunization history on file for this patient.  Past Medical History:  Diagnosis Date   Cancer ([place]) 2021   Uterine    COVID    Hypertension    Morbid obesity ([place])    Sleep apnea     Tobacco History: Social History   Tobacco Use  Smoking Status Never  Smokeless Tobacco Never   Counseling given: Not Answered   Outpatient Medications Prior to Visit  Medication Sig Dispense Refill   amLODipine (NORVASC) 5 MG tablet Take 1 tablet (5 mg total) by mouth daily. 180 tablet 3   cyclobenzaprine (FLEXERIL) 10 MG tablet Take 1 tablet (10 mg total) by mouth at bedtime as needed for muscle spasms. 30 tablet 0   famotidine (PEPCID) 20 MG tablet Take 1 tablet (20 mg total) by mouth daily. 14 tablet 0   ibuprofen (ADVIL) 800 MG tablet Take 1 tablet (800 mg total) by mouth every 6 (six) hours as needed. 60 tablet 1   omeprazole (PRILOSEC) 10 MG capsule Take 10 mg by mouth daily.     oxyCODONE-acetaminophen (PERCOCET) 10-325 MG tablet Take 1 tablet by mouth every 4 (four) hours as needed for pain.     No facility-administered medications prior to visit.    Review of Systems  Review of Systems  Constitutional: Negative.  Negative for fatigue.  Respiratory:  Negative for cough, shortness of breath and wheezing.   Cardiovascular: Negative.   Psychiatric/Behavioral: Negative.  Physical Exam  BP 138/80 (BP Location: Left Arm, Patient Position: Sitting, Cuff Size: Normal)   Pulse 71   Temp 97.7 F (36.5 C) (Oral)   Ht 5\' 2"  (1.575 m)   Wt 253 lb 9.6 oz (115 kg)   LMP  (LMP Unknown)   SpO2 98%   BMI 46.38 kg/m  Physical Exam Constitutional:      Appearance: Normal appearance.  HENT:     Head: Normocephalic and atraumatic.     Mouth/Throat:     Mouth: Mucous membranes are moist.     Pharynx: Oropharynx is clear.  Cardiovascular:     Rate and Rhythm: Normal rate and regular rhythm.  Pulmonary:     Effort: Pulmonary  effort is normal.     Breath sounds: Normal breath sounds. No wheezing, rhonchi or rales.  Musculoskeletal:        General: Normal range of motion.  Skin:    General: Skin is warm and dry.  Neurological:     General: No focal deficit present.     Mental Status: She is alert and oriented to person, place, and time. Mental status is at baseline.  Psychiatric:        Mood and Affect: Mood normal.        Behavior: Behavior normal.        Thought Content: Thought content normal.        Judgment: Judgment normal.     Lab Results:  CBC    Component Value Date/Time   WBC 6.4 01/14/2021 0922   WBC 7.8 08/27/2020 1614   RBC 4.91 01/14/2021 0922   RBC 4.88 08/27/2020 1614   HGB 13.6 01/14/2021 0922   HCT 42.0 01/14/2021 0922   PLT 245 01/14/2021 0922   MCV 86 01/14/2021 0922   MCV 85 12/04/2013 2302   MCH 27.7 01/14/2021 0922   MCH 27.9 08/27/2020 1614   MCHC 32.4 01/14/2021 0922   MCHC 32.2 08/27/2020 1614   RDW 13.1 01/14/2021 0922   RDW 14.0 12/04/2013 2302   LYMPHSABS 2.2 01/14/2021 0922   MONOABS 0.8 08/27/2020 1614   EOSABS 0.2 01/14/2021 0922   BASOSABS 0.0 01/14/2021 0922    BMET    Component Value Date/Time   NA 142 01/14/2021 0922   NA 139 12/04/2013 2302   K 4.3 01/14/2021 0922   K 3.6 12/04/2013 2302   CL 104 01/14/2021 0922   CL 107 12/04/2013 2302   CO2 25 01/14/2021 0922   CO2 27 12/04/2013 2302   GLUCOSE 97 01/14/2021 0922   GLUCOSE 102 (H) 08/27/2020 1614   GLUCOSE 92 12/04/2013 2302   BUN 16 01/14/2021 0922   BUN 19 (H) 12/04/2013 2302   CREATININE 0.83 01/14/2021 0922   CREATININE 0.95 12/04/2013 2302   CALCIUM 9.3 01/14/2021 0922   CALCIUM 8.6 12/04/2013 2302   GFRNONAA >60 08/27/2020 1614   GFRNONAA >60 12/04/2013 2302   GFRAA >60 05/15/2020 1329   GFRAA >60 12/04/2013 2302    BNP    Component Value Date/Time   BNP 21.4 01/14/2021 0922    ProBNP No results found for: PROBNP  Imaging: No results found.   Assessment & Plan:    Moderate obstructive sleep apnea - HST in August 2021 showed moderate OSA. She is 93% compliant with CPAP > 4 hours. She reports not getting enough air at night and dry mouth. Otherwise tolerating CPAP well and reports benefit from use. Current pressure 10cm h20; residual AHI 0.3. Recommend trial auto  CPAP 5-15cm h20. Advised patient to adjust humidification setting on CPAP and try biotene mouth wash. If still having issues with dry mouth consider nasal mask with chin strap. FU in 6 months or sooner if needed.    Martyn Ehrich, NP 06/02/2021

## 2021-06-02 NOTE — Assessment & Plan Note (Signed)
-   HST in August 2021 showed moderate OSA. She is 93% compliant with CPAP > 4 hours. She reports not getting enough air at night and dry mouth. Otherwise tolerating CPAP well and reports benefit from use. Current pressure 10cm h20; residual AHI 0.3. Recommend trial auto CPAP 5-15cm h20. Advised patient to adjust humidification setting on CPAP and try biotene mouth wash. If still having issues with dry mouth consider nasal mask with chin strap. FU in 6 months or sooner if needed.

## 2021-06-03 NOTE — Progress Notes (Signed)
Reviewed and agree with assessment/plan.   Chesley Mires, MD [facility] Pulmonary/Critical Care 06/03/2021, 8:31 AM Pager:  [phone]

## 2021-06-10 ENCOUNTER — Encounter: Payer: Self-pay | Admitting: Podiatry

## 2021-06-10 ENCOUNTER — Ambulatory Visit (INDEPENDENT_AMBULATORY_CARE_PROVIDER_SITE_OTHER): Payer: Medicare Other | Admitting: Podiatry

## 2021-06-10 ENCOUNTER — Other Ambulatory Visit: Payer: Self-pay

## 2021-06-10 DIAGNOSIS — M21862 Other specified acquired deformities of left lower leg: Secondary | ICD-10-CM

## 2021-06-10 DIAGNOSIS — M722 Plantar fascial fibromatosis: Secondary | ICD-10-CM

## 2021-06-10 DIAGNOSIS — Z9889 Other specified postprocedural states: Secondary | ICD-10-CM

## 2021-06-10 MED ORDER — OXYCODONE-ACETAMINOPHEN 10-325 MG PO TABS: 1.0000 | ORAL_TABLET | ORAL | 0 refills | Status: DC | PRN

## 2021-06-10 NOTE — Progress Notes (Signed)
  Subjective:  Patient ID: Allison Dixon, female    DOB: 12-30-1960,  MRN: [id number]  Chief Complaint  Patient presents with   Routine Post Op    POV #2 DOS 05/19/2021 LT EPF W/GASTROCNEMIUS RECESSION "its feeling a little better"    DOS: 05/19/2021 Procedure: Left EPF and gastrocnemius recession  60 y.o. female returns for post-op check.  Patient states she is doing well.  She still has pain while ambulating.  She states she has been walking with the boot on.  She has been taking her pain medication.  She denies any other acute complaints.  Review of Systems: Negative except as noted in the HPI. Denies N/V/F/Ch.  Past Medical History:  Diagnosis Date   Cancer ([place]) 2021   Uterine    COVID    Hypertension    Morbid obesity ([place])    Sleep apnea     Current Outpatient Medications:    amLODipine (NORVASC) 5 MG tablet, Take 1 tablet (5 mg total) by mouth daily., Disp: 180 tablet, Rfl: 3   cyclobenzaprine (FLEXERIL) 10 MG tablet, Take 1 tablet (10 mg total) by mouth at bedtime as needed for muscle spasms., Disp: 30 tablet, Rfl: 0   famotidine (PEPCID) 20 MG tablet, Take 1 tablet (20 mg total) by mouth daily., Disp: 14 tablet, Rfl: 0   ibuprofen (ADVIL) 800 MG tablet, Take 1 tablet (800 mg total) by mouth every 6 (six) hours as needed., Disp: 60 tablet, Rfl: 1   omeprazole (PRILOSEC) 10 MG capsule, Take 10 mg by mouth daily., Disp: , Rfl:    oxyCODONE-acetaminophen (PERCOCET) 10-325 MG tablet, Take 1 tablet by mouth every 4 (four) hours as needed for pain., Disp: 30 tablet, Rfl: 0  Social History   Tobacco Use  Smoking Status Never  Smokeless Tobacco Never    No Known Allergies Objective:  There were no vitals filed for this visit. There is no height or weight on file to calculate BMI. Constitutional Well developed. Well nourished.  Vascular Foot warm and well perfused. Capillary refill normal to all digits.   Neurologic Normal speech. Oriented to person, place, and  time. Epicritic sensation to light touch grossly present bilaterally.  Dermatologic Skin completely epithelialized.  No clinical signs of dehiscence noted no complication noted.  Good range of motion noted at the ankle joint.  Orthopedic: Mild tenderness to palpation noted about the surgical site.   Radiographs: None Assessment:   1. Plantar fasciitis, left   2. Gastrocnemius equinus, left   3. Status post foot surgery     Plan:  Patient was evaluated and treated and all questions answered.  S/p foot surgery right -Progressing as expected post-operatively. -XR: None -WB Status: Transition to weightbearing as tolerated in regular shoes -Sutures: Removed no clinical signs of dehiscence noted no complication noted. -Medications: None -We will discuss physical therapy options next time if she is not clinically doing better.  No follow-ups on file.

## 2021-06-25 ENCOUNTER — Other Ambulatory Visit: Payer: Self-pay

## 2021-06-25 ENCOUNTER — Inpatient Hospital Stay: Payer: Medicare Other | Attending: Obstetrics and Gynecology | Admitting: Obstetrics and Gynecology

## 2021-06-25 VITALS — BP 131/79 | HR 86 | Temp 97.8°F | Resp 20 | Wt 247.8 lb

## 2021-06-25 DIAGNOSIS — Z90722 Acquired absence of ovaries, bilateral: Secondary | ICD-10-CM | POA: Insufficient documentation

## 2021-06-25 DIAGNOSIS — I10 Essential (primary) hypertension: Secondary | ICD-10-CM | POA: Insufficient documentation

## 2021-06-25 DIAGNOSIS — Z8542 Personal history of malignant neoplasm of other parts of uterus: Secondary | ICD-10-CM | POA: Diagnosis present

## 2021-06-25 DIAGNOSIS — M545 Low back pain, unspecified: Secondary | ICD-10-CM | POA: Diagnosis not present

## 2021-06-25 DIAGNOSIS — Z6841 Body Mass Index (BMI) 40.0 and over, adult: Secondary | ICD-10-CM | POA: Diagnosis not present

## 2021-06-25 DIAGNOSIS — Z90710 Acquired absence of both cervix and uterus: Secondary | ICD-10-CM | POA: Diagnosis not present

## 2021-06-25 DIAGNOSIS — C541 Malignant neoplasm of endometrium: Secondary | ICD-10-CM

## 2021-06-25 NOTE — Patient Instructions (Signed)
Call and make appointment to see Dr. Marcelline Mates in 6 months. We will see you back in 1 year.

## 2021-06-25 NOTE — Progress Notes (Signed)
Gynecologic Oncology Interval Visit   Referring Provider: Diona Dixon, CNM [place],  [place] [address] [phone]  Chief Concern: endometrial cancer  Subjective:  Allison Dixon is a 60 y.o. G2P7 female, diagnosed with stage IA grade I endometrial cancer s/p TLH BSO with bilateral pelvic SLN mapping (no biopsies) and right periurethral biopsy (negative) on 05/22/20, who returns to clinic for follow-up.   She was diagnosed with stage IA grade 1 endometrial cancer.  She underwent TLH BSO with bilateral pelvic SLN mapping and right periurethral biopsy with Dr. Fransisca Connors and Dr. Marcelline Mates on 05/22/20 with invasion 5/25 mm and negative LVSI, adnexa, cervix and washings. SLN not biopsied due to no mapping because of morbid obesity. Periurethral biopsy negative. Cancer has loss of MLH1 and PMS2 and MLH1 promoter methylation assay positive. No need for Lynch Syndrome testing.   She complains of low back pain which she's had for many years.  No new complaints.    Gynecologic Oncology: Initially seen in consultation from Allison Dixon for consultation for endometrial cancer. She presented with abnormal bleeding, irregular x 6-7 months. Her evaluation included the following tests:   Pelvic US 04/18/2020 The uterus is anteverted and measures 10.2 x 5.4. Echo texture is heterogenous with evidence of focal masses. Within the uterus are multiple suspected fibroids measuring: Fibroid 1:Posterior IM 2.6 x 3.1 x 3.0 cm Fibroid 2:Anterior IM 2.2 x 2.2 x 1.8 cm The Endometrium measures 14 mm. Right Ovary was not seen Left Ovary was not seen Survey of the adnexa demonstrates no adnexal masses. There is no free fluid in the cul de sac.  Endometrial biopsy 04/18/2020  A. 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   DIAGNOSIS:  A.   UTERUS WITH CERVIX, BILATERAL FALLOPIAN TUBES AND OVARIES; TOTAL  HYSTERECTOMY WITH BILATERAL SALPINGO-OOPHORECTOMY:  - ENDOMETRIAL CARCINOMA.  - SEE CANCER STAGING SUMMARY BELOW.  - INACTIVE BACKGROUND ENDOMETRIUM WITH 2.3 CM ENDOMETRIAL POLYP WITH  ENDOMETRIAL INTRAEPITHELIAL NEOPLASIA.  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  - MYOMETRIUM WITH ADENOMYOSIS.  - BILATERAL OVARIES, NEGATIVE FOR MALIGNANCY.  - BILATERAL FALLOPIAN TUBES, NEGATIVE FOR MALIGNANCY.   B. PERI URETHRA, RIGHT; BIOPSY:  - LENTIGO SIMPLEX.  - HYPERKERATOSIS.  - NEGATIVE FOR DYSPLASIA AND MALIGNANCY.  - DEEPER SECTIONS AND S100 STAIN EXAMINED.   CANCER CASE SUMMARY: ENDOMETRIUM  Procedure: Total hysterectomy and bilateral salpingo-oophorectomy  Tumor Size: Greatest dimension: 4 cm  Histologic Type: Endometrioid carcinoma  Histologic Grade: FIGO grade 1  Myometrial Invasion: Present       Depth of invasion: 5 mm       Myometrial thickness: 25 mm       Percentage of myometrial invasion: 20%  Uterine Serosa Involvement: Not identified  Cervical Stromal Involvement: Not identified  Other Tissue/Organ Involvement: Not applicable  Lymphovascular Invasion: Not identified  Regional Lymph Nodes: No lymph nodes submitted or found  Pathologic Stage Classification (pTNM, AJCC 8th Edition): pT1a pN not  assigned / FIGO IA   Immunohistochemistry (IHC) Testing for DNA Mismatch Repair (MMR)  Proteins:  Results:  MLH1: Loss of protein expression  MSH2: Intact nuclear expression  MSH6: Intact nuclear expression, heterogenous pattern  PMS2: Loss of protein expression   IHC Interpretation: Abnormal result with absent nuclear staining for MLH1 and PMS2, high probability of MSI-H.  This pattern of deficient DNA repair proteins is most often associated with methylation of the MLH1 gene promoter. Testing for MLH1 promoter methylation has been requested and the result will be  reported in an addendum. Of note, a heterogeneous pattern of MSH-6 staining typically does not indicate germline mutation in MSH6 but is commonly associated with abnormality in another MMR gene such as MLH1 or PMS2, or even other DNA repair genes such as DNA polymerase.  MLH1 promoter methylation positive  Problem List: Patient Active Problem List   Diagnosis Date Noted   Primary hypertension 01/14/2021   Prediabetes 01/14/2021   Chronic bilateral low back pain with right-sided sciatica 01/14/2021   Left foot pain 01/14/2021   Morbid obesity ([place]) 01/14/2021   S/P laparoscopic hysterectomy 05/30/2020   Endometrial cancer ([place])    Moderate obstructive sleep apnea 05/07/2020   Shortness of breath 05/07/2020   Abnormal uterine bleeding 04/18/2020  Ectopic Pregnant  Past Medical History: Past Medical History:  Diagnosis Date   Cancer ([place]) 2021   Uterine    COVID    Hypertension    Morbid obesity ([place])    Sleep apnea     Past Surgical History: Past Surgical History:  Procedure Laterality Date   DILATION AND CURETTAGE, DIAGNOSTIC / THERAPEUTIC     EXPLORATORY LAPAROTOMY     for ectopic pregnancy   TOTAL LAPAROSCOPIC HYSTERECTOMY WITH BILATERAL SALPINGO OOPHORECTOMY Bilateral 05/22/2020   Procedure: TOTAL LAPAROSCOPIC HYSTERECTOMY WITH BILATERAL SALPINGO OOPHORECTOMY AND WASHINGS, PERINEAL BIOPSY;  Surgeon: Mellody Drown, MD;  Location: [facility] ORS;  Service: Gynecology;  Laterality: Bilateral;    Past Gynecologic History:  As per HPI  OB History:  SVD x 7 OB History  Gravida Para Term Preterm AB Living  7 7 0 0 0 0  SAB IAB Ectopic Multiple Live Births  0 0 0 0 0    # Outcome Date GA Lbr Len/2nd Weight Sex Delivery Anes PTL Lv  7 Para           6 Para           5 Para           4 Para           3 Para           2 Para           1 Para             Obstetric Comments  NSVD x 7    Family History: Family History  Problem Relation Age of Onset   Heart attack  Mother    Cancer Mother    Cancer Father     Social History: Social History   Socioeconomic History   Marital status: Widowed    Spouse name: Not on file   Number of children: Not on file   Years of education: Not on file   Highest education level: Not on file  Occupational History   Not on file  Tobacco Use   Smoking status: Never   Smokeless tobacco: Never  Vaping Use   Vaping Use: Never used  Substance and Sexual Activity   Alcohol use: Never   Drug use: Never   Sexual activity: Not Currently  Other Topics Concern   Not on file  Social History Narrative   Not on file   Social Determinants of Health   Financial Resource Strain: Low Risk    Difficulty of Paying Living Expenses: Not hard at all  Food  Insecurity: No Food Insecurity   Worried About Charity fundraiser in the Last Year: Never true   Ran Out of Food in the Last Year: Never true  Transportation Needs: No Transportation Needs   Lack of Transportation (Medical): No   Lack of Transportation (Non-Medical): No  Physical Activity: Insufficiently Active   Days of Exercise per Week: 2 days   Minutes of Exercise per Session: 10 min  Stress: No Stress Concern Present   Feeling of Stress : Not at all  Social Connections: Socially Isolated   Frequency of Communication with Friends and Family: Once a week   Frequency of Social Gatherings with Friends and Family: Once a week   Attends Religious Services: 1 to 4 times per year   Active Member of [employer] or Organizations: No   Attends Archivist Meetings: Never   Marital Status: Widowed  Human resources officer Violence: Not At Risk   Fear of Current or Ex-Partner: No   Emotionally Abused: No   Physically Abused: No   Sexually Abused: No   Allergies: No Known Allergies  Current Medications: Current Outpatient Medications  Medication Sig Dispense Refill   amLODipine (NORVASC) 5 MG tablet Take 1 tablet (5 mg total) by mouth daily. 180 tablet 3    cyclobenzaprine (FLEXERIL) 10 MG tablet Take 1 tablet (10 mg total) by mouth at bedtime as needed for muscle spasms. 30 tablet 0   famotidine (PEPCID) 20 MG tablet Take 1 tablet (20 mg total) by mouth daily. 14 tablet 0   ibuprofen (ADVIL) 800 MG tablet Take 1 tablet (800 mg total) by mouth every 6 (six) hours as needed. 60 tablet 1   omeprazole (PRILOSEC) 10 MG capsule Take 10 mg by mouth daily.     oxyCODONE-acetaminophen (PERCOCET) 10-325 MG tablet Take 1 tablet by mouth every 4 (four) hours as needed for pain. 30 tablet 0   No current facility-administered medications for this visit.   Review of Systems General:  fatigue Skin: no complaints Eyes: no complaints HEENT: no complaints Breasts: no complaints Pulmonary: shortness of breath Cardiac: no complaints Gastrointestinal: no complaints Genitourinary/Sexual: no complaints Ob/Gyn: no complaints Musculoskeletal: back pain Hematology: no complaints Neurologic/Psych: no complaints    Objective:  Physical Examination:  BP 131/79   Pulse 86   Temp 97.8 F (36.6 C)   Resp 20   Wt 247 lb 12.8 oz (112.4 kg)   LMP  (LMP Unknown)   SpO2 100%   BMI 45.32 kg/m    ECOG Performance Status: 1 - Symptomatic but completely ambulatory  GENERAL: Patient is a well appearing female in no acute distress HEENT:  Sclera clear. Anicteric NODES:  Negative axillary, supraclavicular, inguinal lymph node survery LUNGS:  Clear to auscultation bilaterally.   HEART:  Regular rate and rhythm.  ABDOMEN:  Soft, nontender.  No hernias, incisions well healed. No masses or ascites EXTREMITIES:  No peripheral edema. Atraumatic. No cyanosis SKIN:  Clear with no obvious rashes or skin changes.  NEURO:  Nonfocal. Well oriented.  Appropriate affect.   Pelvic exam: normal external genitalia, vulva, vagina, cervix, uterus and adnexa, VULVA: normal appearing vulva with no masses, tenderness or lesions, VAGINA: normal appearing vagina with normal color and  discharge, no lesions, CERVIX: surgically absent, UTERUS: surgically absent, vaginal cuff well healed, BME  nontender and no masses,    Lab Review Lab Results  Component Value Date   WBC 6.4 01/14/2021   HGB 13.6 01/14/2021   HCT 42.0 01/14/2021  MCV 86 01/14/2021   PLT 245 01/14/2021    Radiologic Imaging: As per HPI   Assessment:  KENIAH KLEMMER is a 60 y.o. female diagnosed with stage IA grade 1 endometrial cancer.  She underwent TLH BSO with bilateral pelvic SLN mapping and right periurethral biopsy with Dr. Fransisca Connors and Dr. Marcelline Mates on 05/22/20 with invasion 5/25 mm and negative LVSI, adnexa, cervix and washings. SLN not biopsied due to no mapping because of morbid obesity. Periurethral biopsy negative. Clincally NED   Cancer has loss of MLH1 and PMS2 and MLH1 promoter methylation assay positive. No need for Lynch Syndrome testing.   History of exploratory laparotomy for ectopic pregnancy.    Medical co-morbidities complicating care:  morbid obesity (MBI 42), HTN and prior abdominal surgery.  Plan:   Problem List Items Addressed This Visit       Genitourinary   Endometrial cancer ([place]) - Primary    Follow up with Dr. Marcelline Mates in 6 months and with [place] in a year.  Can return sooner if any bleeding, discharge, pain or other concerning symptoms.  Mellody Drown, MD

## 2021-07-01 ENCOUNTER — Ambulatory Visit (INDEPENDENT_AMBULATORY_CARE_PROVIDER_SITE_OTHER): Payer: Medicare Other | Admitting: Podiatry

## 2021-07-01 ENCOUNTER — Encounter: Payer: Self-pay | Admitting: Podiatry

## 2021-07-01 ENCOUNTER — Other Ambulatory Visit: Payer: Self-pay

## 2021-07-01 VITALS — Temp 98.2°F

## 2021-07-01 DIAGNOSIS — Z9889 Other specified postprocedural states: Secondary | ICD-10-CM

## 2021-07-01 DIAGNOSIS — M722 Plantar fascial fibromatosis: Secondary | ICD-10-CM

## 2021-07-01 DIAGNOSIS — M21862 Other specified acquired deformities of left lower leg: Secondary | ICD-10-CM

## 2021-07-01 NOTE — Progress Notes (Signed)
  Subjective:  Patient ID: Allison Dixon, female    DOB: 12/11/1960,  MRN: [id number]  Chief Complaint  Patient presents with   Routine Post Op    "It keeps swelling up but it's doing better."    DOS: 05/19/2021 Procedure: Left EPF and gastrocnemius recession  60 y.o. female returns for post-op check.  Patient states she is doing well.  She still has pain while ambulating.  She states she has been walking with the boot on.  She has been taking her pain medication.  She denies any other acute complaints.  Review of Systems: Negative except as noted in the HPI. Denies N/V/F/Ch.  Past Medical History:  Diagnosis Date   Cancer ([place]) 2021   Uterine    COVID    Hypertension    Morbid obesity ([place])    Sleep apnea     Current Outpatient Medications:    amLODipine (NORVASC) 5 MG tablet, Take 1 tablet (5 mg total) by mouth daily., Disp: 180 tablet, Rfl: 3   cyclobenzaprine (FLEXERIL) 10 MG tablet, Take 1 tablet (10 mg total) by mouth at bedtime as needed for muscle spasms., Disp: 30 tablet, Rfl: 0   famotidine (PEPCID) 20 MG tablet, Take 1 tablet (20 mg total) by mouth daily., Disp: 14 tablet, Rfl: 0   ibuprofen (ADVIL) 800 MG tablet, Take 1 tablet (800 mg total) by mouth every 6 (six) hours as needed., Disp: 60 tablet, Rfl: 1   omeprazole (PRILOSEC) 10 MG capsule, Take 10 mg by mouth daily., Disp: , Rfl:    oxyCODONE-acetaminophen (PERCOCET) 10-325 MG tablet, Take 1 tablet by mouth every 4 (four) hours as needed for pain., Disp: 30 tablet, Rfl: 0  Social History   Tobacco Use  Smoking Status Never  Smokeless Tobacco Never    No Known Allergies Objective:   Vitals:   07/01/21 0853  Temp: 98.2 F (36.8 C)   There is no height or weight on file to calculate BMI. Constitutional Well developed. Well nourished.  Vascular Foot warm and well perfused. Capillary refill normal to all digits.   Neurologic Normal speech. Oriented to person, place, and time. Epicritic sensation to  light touch grossly present bilaterally.  Dermatologic Skin completely epithelialized.  No clinical signs of dehiscence noted no complication noted.  Good range of motion noted at the ankle joint.  Orthopedic: Mild tenderness to palpation noted about the surgical site.   Radiographs: None Assessment:   1. Plantar fasciitis, left   2. Gastrocnemius equinus, left   3. Status post foot surgery      Plan:  Patient was evaluated and treated and all questions answered.  S/p foot surgery right -Progressing as expected post-operatively. -XR: None -WB Status: Transition to weightbearing as tolerated in regular shoes -Sutures: Removed no clinical signs of dehiscence noted no complication noted. -Medications: None -Clinically her pain is resolved.  I discussed shoe gear modification extensive detail encouraged her to transition to regular shoes.  If unable to transition have asked her place her self in the boot and come back and see me.  At this time patient is officially discharged from my care  No follow-ups on file.

## 2021-07-09 ENCOUNTER — Telehealth: Payer: Self-pay | Admitting: *Deleted

## 2021-07-09 MED ORDER — OXYCODONE-ACETAMINOPHEN 10-325 MG PO TABS: 1.0000 | ORAL_TABLET | ORAL | 0 refills | Status: DC | PRN

## 2021-07-09 NOTE — Addendum Note (Signed)
Addended by: Boneta Lucks on: 07/09/2021 09:29 PM   Modules accepted: Orders

## 2021-07-09 NOTE — Telephone Encounter (Signed)
Patient called requesting refill on pain medication. 

## 2021-07-28 NOTE — Progress Notes (Signed)
BP 109/74   Pulse 81   Temp 98.3 F (36.8 C) (Oral)   Ht 5' 2.01" (1.575 m)   Wt 255 lb (115.7 kg)   LMP  (LMP Unknown)   SpO2 95%   BMI 46.63 kg/m    Subjective:    Patient ID: Allison Dixon, female    DOB: [birth year], 60 y.o.   MRN: [id number]  CC: Chief Complaint  Patient presents with   Annual Exam    HPI: Allison Dixon is a 60 y.o. female presenting on 07/29/2021 for comprehensive medical examination. Current medical complaints include:none  She currently lives with: grandson Menopausal Symptoms: no  HYPERTENSION  Hypertension status: controlled  Satisfied with current treatment? yes Duration of hypertension: years BP monitoring frequency:  daily BP range: "good" BP medication side effects:  no Medication compliance: excellent compliance Previous BP meds:amlodipine Aspirin: no Recurrent headaches: no Visual changes: no Palpitations: no Dyspnea: no Chest pain: no Lower extremity edema: no Dizzy/lightheaded: no   Depression Screen done today and results listed below:  Depression screen [facility] 2/9 07/29/2021 05/31/2021 05/31/2021 01/14/2021  Decreased Interest 0 1 1 0  Down, Depressed, Hopeless 0 0 0 0  PHQ - 2 Score 0 1 1 0  Altered sleeping 2 3 - 1  Tired, decreased energy 2 2 - 1  Change in appetite 2 2 - 3  Feeling bad or failure about yourself  0 1 - 0  Trouble concentrating 0 1 - 0  Moving slowly or fidgety/restless 0 1 - 0  Suicidal thoughts 0 0 - 0  PHQ-9 Score 6 11 - 5  Difficult doing work/chores Somewhat difficult Somewhat difficult - -    The patient does not have a history of falls. I did not complete a risk assessment for falls. A plan of care for falls was not documented.   Past Medical History:  Past Medical History:  Diagnosis Date   Cancer ([place]) 2021   Uterine    COVID    Endometrial cancer ([place])    Hypertension    Morbid obesity ([place])    Sleep apnea     Surgical History:  Past Surgical History:  Procedure Laterality Date    DILATION AND CURETTAGE, DIAGNOSTIC / THERAPEUTIC     EXPLORATORY LAPAROTOMY     for ectopic pregnancy   TOTAL LAPAROSCOPIC HYSTERECTOMY WITH BILATERAL SALPINGO OOPHORECTOMY Bilateral 05/22/2020   Procedure: TOTAL LAPAROSCOPIC HYSTERECTOMY WITH BILATERAL SALPINGO OOPHORECTOMY AND WASHINGS, PERINEAL BIOPSY;  Surgeon: Mellody Drown, MD;  Location: [facility] ORS;  Service: Gynecology;  Laterality: Bilateral;    Medications:  Current Outpatient Medications on File Prior to Visit  Medication Sig   amLODipine (NORVASC) 5 MG tablet Take 1 tablet (5 mg total) by mouth daily.   cyclobenzaprine (FLEXERIL) 10 MG tablet Take 1 tablet (10 mg total) by mouth at bedtime as needed for muscle spasms.   oxyCODONE-acetaminophen (PERCOCET) 10-325 MG tablet Take 1 tablet by mouth every 4 (four) hours as needed for pain.   pantoprazole (PROTONIX) 40 MG tablet SMARTSIG:1 Tablet(s) By Mouth Every Evening   famotidine (PEPCID) 20 MG tablet Take 1 tablet (20 mg total) by mouth daily. (Patient not taking: Reported on 07/29/2021)   fluticasone (FLONASE) 50 MCG/ACT nasal spray Place 2 sprays into both nostrils daily.   omeprazole (PRILOSEC) 10 MG capsule Take 10 mg by mouth daily. (Patient not taking: Reported on 07/29/2021)   No current facility-administered medications on file prior to visit.    Allergies:  No Known  Allergies  Social History:  Social History   Socioeconomic History   Marital status: Widowed    Spouse name: Not on file   Number of children: Not on file   Years of education: Not on file   Highest education level: Not on file  Occupational History   Not on file  Tobacco Use   Smoking status: Never   Smokeless tobacco: Never  Vaping Use   Vaping Use: Never used  Substance and Sexual Activity   Alcohol use: Never   Drug use: Never   Sexual activity: Not Currently  Other Topics Concern   Not on file  Social History Narrative   Not on file   Social Determinants of Health   Financial  Resource Strain: Low Risk    Difficulty of Paying Living Expenses: Not hard at all  Food Insecurity: No Food Insecurity   Worried About Charity fundraiser in the Last Year: Never true   [place] in the Last Year: Never true  Transportation Needs: No Transportation Needs   Lack of Transportation (Medical): No   Lack of Transportation (Non-Medical): No  Physical Activity: Insufficiently Active   Days of Exercise per Week: 2 days   Minutes of Exercise per Session: 10 min  Stress: No Stress Concern Present   Feeling of Stress : Not at all  Social Connections: Socially Isolated   Frequency of Communication with Friends and Family: Once a week   Frequency of Social Gatherings with Friends and Family: Once a week   Attends Religious Services: 1 to 4 times per year   Active Member of [employer] or Organizations: No   Attends Archivist Meetings: Never   Marital Status: Widowed  Human resources officer Violence: Not At Risk   Fear of Current or Ex-Partner: No   Emotionally Abused: No   Physically Abused: No   Sexually Abused: No   Social History   Tobacco Use  Smoking Status Never  Smokeless Tobacco Never   Social History   Substance and Sexual Activity  Alcohol Use Never    Family History:  Family History  Problem Relation Age of Onset   Heart attack Mother    Cancer Mother    Cancer Father     Past medical history, surgical history, medications, allergies, family history and social history reviewed with patient today and changes made to appropriate areas of the chart.   Review of Systems  Constitutional:  Positive for malaise/fatigue.  HENT: Negative.    Eyes: Negative.   Respiratory: Negative.    Cardiovascular: Negative.   Gastrointestinal:  Positive for heartburn. Negative for constipation, diarrhea, nausea and vomiting.  Genitourinary:  Positive for frequency. Negative for hematuria and urgency.  Musculoskeletal:  Positive for back pain.  Skin: Negative.    Neurological: Negative.   Endo/Heme/Allergies: Negative.   Psychiatric/Behavioral: Negative.    All other ROS negative except what is listed above and in the HPI.      Objective:    BP 109/74   Pulse 81   Temp 98.3 F (36.8 C) (Oral)   Ht 5' 2.01" (1.575 m)   Wt 255 lb (115.7 kg)   LMP  (LMP Unknown)   SpO2 95%   BMI 46.63 kg/m   Wt Readings from Last 3 Encounters:  07/29/21 255 lb (115.7 kg)  06/25/21 247 lb 12.8 oz (112.4 kg)  06/02/21 253 lb 9.6 oz (115 kg)    Physical Exam Vitals and nursing note reviewed.  Constitutional:  General: She is not in acute distress.    Appearance: Normal appearance. She is obese.  HENT:     Head: Normocephalic and atraumatic.     Right Ear: Tympanic membrane, ear canal and external ear normal.     Left Ear: Tympanic membrane, ear canal and external ear normal.     Nose: Nose normal.     Mouth/Throat:     Mouth: Mucous membranes are moist.     Pharynx: Oropharynx is clear.  Eyes:     Conjunctiva/sclera: Conjunctivae normal.  Cardiovascular:     Rate and Rhythm: Normal rate and regular rhythm.     Pulses: Normal pulses.     Heart sounds: Normal heart sounds.  Pulmonary:     Effort: Pulmonary effort is normal.     Breath sounds: Normal breath sounds.  Abdominal:     General: Bowel sounds are normal.     Palpations: Abdomen is soft.     Tenderness: There is no abdominal tenderness.  Musculoskeletal:        General: Normal range of motion.     Cervical back: Normal range of motion and neck supple. No tenderness.  Lymphadenopathy:     Cervical: No cervical adenopathy.  Skin:    General: Skin is warm and dry.     Comments: Wound to left lower leg, healing well, no signs of infection  Neurological:     General: No focal deficit present.     Mental Status: She is alert and oriented to person, place, and time.     Cranial Nerves: No cranial nerve deficit.     Coordination: Coordination normal.     Gait: Gait normal.   Psychiatric:        Mood and Affect: Mood normal.        Behavior: Behavior normal.        Thought Content: Thought content normal.        Judgment: Judgment normal.    Results for orders placed or performed in visit on 04/28/21  TSH  Result Value Ref Range   TSH 1.030 0.450 - 4.500 uIU/mL      Assessment & Plan:   Problem List Items Addressed This Visit       Cardiovascular and Mediastinum   Primary hypertension - Primary    Chronic, well controlled. Continue amlodipine daily. Check CMP, CBC today. Follow up in 6 months.       Relevant Orders   CBC with Differential/Platelet   Comprehensive metabolic panel     Respiratory   Moderate obstructive sleep apnea    Using CPAP. Follows with pulmonology. Continue collaboration and recommendations.         Nervous and Auditory   Chronic bilateral low back pain with right-sided sciatica    Chronic, ongoing. Takes ibuprofen and gabapentin as needed for pain. Instructed to be mindful of the amount of ibuprofen she is taking as this can cause her GERD/globus sensation to worsen. Discussed the importance of daily stretches. Exercises printed for patient. Follow up in 6 months or sooner with concerns.       Relevant Medications   ibuprofen (ADVIL) 800 MG tablet     Other   Prediabetes    Check A1C today. Discussed limiting sugars and carbohydrates along with increasing exercise.       Relevant Orders   Bayer DCA Hb A1c Waived   CBC with Differential/Platelet   Comprehensive metabolic panel   Morbid obesity (HCC)    BMI 46  today. Discussed diet and exercise. Goal is to lose 1-2 pounds per week. This in combination with deconditioning can be leading to fatigue and shortness of breath.       History of endometrial cancer    Follow with GYN and oncology. Continue collaboration and recommendations.       Other Visit Diagnoses     Routine general medical examination at a health care facility       Health maintenance  reviewed and updated. Declined shingrix, flu vaccine. Cologuard ordered. Follows with GYN for history of endometrial cancer   Screening, lipid       Lipid panel today and treat based on results    Relevant Orders   Lipid Panel w/o Chol/HDL Ratio   Encounter for hepatitis C screening test for low risk patient       Hepatitis C screen today   Relevant Orders   Hepatitis C antibody   Screening for HIV (human immunodeficiency virus)       HIV test today   Relevant Orders   HIV Antibody (routine testing w rflx)   Screen for colon cancer       Cologuard ordered   Relevant Orders   Cologuard   Vitamin D deficiency       Will check vitamin D today and treat based on resutls    Relevant Orders   Vitamin D (25 hydroxy)   Wound of left lower extremity, initial encounter       Healing, no signs of infection. Update tetanus today   Relevant Orders   Td : Tetanus/diphtheria >7yo Preservative  free (Completed)        Follow up plan: Return in about 6 months (around 01/26/2022) for htn, prediabetes, back pain with Dr. Neomia Dear.   LABORATORY TESTING:  - Pap smear: up to date  IMMUNIZATIONS:   - Tdap: Tetanus vaccination status reviewed: Td vaccination indicated and given today. - Influenza: Refused - Pneumovax: Not applicable - Prevnar: Not applicable - HPV: Not applicable - Zostavax vaccine: Refused  SCREENING: -Mammogram: Ordered today  - Colonoscopy: Ordered today  - Bone Density: Not applicable  -Hearing Test: Not applicable  -Spirometry: Not applicable   PATIENT COUNSELING:   Advised to take 1 mg of folate supplement per day if capable of pregnancy.   Sexuality: Discussed sexually transmitted diseases, partner selection, use of condoms, avoidance of unintended pregnancy  and contraceptive alternatives.   Advised to avoid cigarette smoking.  I discussed with the patient that most people either abstain from alcohol or drink within safe limits (<=14/week and <=4 drinks/occasion  for males, <=7/weeks and <= 3 drinks/occasion for females) and that the risk for alcohol disorders and other health effects rises proportionally with the number of drinks per week and how often a drinker exceeds daily limits.  Discussed cessation/primary prevention of drug use and availability of treatment for abuse.   Diet: Encouraged to adjust caloric intake to maintain  or achieve ideal body weight, to reduce intake of dietary saturated fat and total fat, to limit sodium intake by avoiding high sodium foods and not adding table salt, and to maintain adequate dietary potassium and calcium preferably from fresh fruits, vegetables, and low-fat dairy products.    stressed the importance of regular exercise  Injury prevention: Discussed safety belts, safety helmets, smoke detector, smoking near bedding or upholstery.   Dental health: Discussed importance of regular tooth brushing, flossing, and dental visits.    NEXT PREVENTATIVE PHYSICAL DUE IN 1 YEAR. Return in  about 6 months (around 01/26/2022) for htn, prediabetes, back pain with Dr. Neomia Dear.

## 2021-07-29 ENCOUNTER — Ambulatory Visit (INDEPENDENT_AMBULATORY_CARE_PROVIDER_SITE_OTHER): Payer: Medicare Other | Admitting: Nurse Practitioner

## 2021-07-29 ENCOUNTER — Encounter: Payer: Self-pay | Admitting: Nurse Practitioner

## 2021-07-29 ENCOUNTER — Other Ambulatory Visit: Payer: Self-pay

## 2021-07-29 VITALS — BP 109/74 | HR 81 | Temp 98.3°F | Ht 62.01 in | Wt 255.0 lb

## 2021-07-29 DIAGNOSIS — Z1211 Encounter for screening for malignant neoplasm of colon: Secondary | ICD-10-CM

## 2021-07-29 DIAGNOSIS — E559 Vitamin D deficiency, unspecified: Secondary | ICD-10-CM

## 2021-07-29 DIAGNOSIS — G4733 Obstructive sleep apnea (adult) (pediatric): Secondary | ICD-10-CM

## 2021-07-29 DIAGNOSIS — S81802A Unspecified open wound, left lower leg, initial encounter: Secondary | ICD-10-CM | POA: Diagnosis not present

## 2021-07-29 DIAGNOSIS — I10 Essential (primary) hypertension: Secondary | ICD-10-CM | POA: Diagnosis not present

## 2021-07-29 DIAGNOSIS — M5441 Lumbago with sciatica, right side: Secondary | ICD-10-CM

## 2021-07-29 DIAGNOSIS — Z23 Encounter for immunization: Secondary | ICD-10-CM | POA: Diagnosis not present

## 2021-07-29 DIAGNOSIS — G8929 Other chronic pain: Secondary | ICD-10-CM

## 2021-07-29 DIAGNOSIS — C541 Malignant neoplasm of endometrium: Secondary | ICD-10-CM

## 2021-07-29 DIAGNOSIS — R7303 Prediabetes: Secondary | ICD-10-CM | POA: Diagnosis not present

## 2021-07-29 DIAGNOSIS — M79672 Pain in left foot: Secondary | ICD-10-CM

## 2021-07-29 DIAGNOSIS — Z8542 Personal history of malignant neoplasm of other parts of uterus: Secondary | ICD-10-CM

## 2021-07-29 DIAGNOSIS — Z0000 Encounter for general adult medical examination without abnormal findings: Secondary | ICD-10-CM

## 2021-07-29 DIAGNOSIS — Z1159 Encounter for screening for other viral diseases: Secondary | ICD-10-CM

## 2021-07-29 DIAGNOSIS — Z114 Encounter for screening for human immunodeficiency virus [HIV]: Secondary | ICD-10-CM

## 2021-07-29 DIAGNOSIS — Z13220 Encounter for screening for lipoid disorders: Secondary | ICD-10-CM

## 2021-07-29 LAB — BAYER DCA HB A1C WAIVED: HB A1C (BAYER DCA - WAIVED): 5.5 % (ref 4.8–5.6)

## 2021-07-29 MED ORDER — IBUPROFEN 800 MG PO TABS: 800.0000 mg | ORAL_TABLET | Freq: Three times a day (TID) | ORAL | 1 refills | Status: DC | PRN

## 2021-07-29 NOTE — Assessment & Plan Note (Signed)
Check A1C today. Discussed limiting sugars and carbohydrates along with increasing exercise.

## 2021-07-29 NOTE — Assessment & Plan Note (Signed)
Chronic, well controlled. Continue amlodipine daily. Check CMP, CBC today. Follow up in 6 months.

## 2021-07-29 NOTE — Assessment & Plan Note (Signed)
Chronic, ongoing. Takes ibuprofen and gabapentin as needed for pain. Instructed to be mindful of the amount of ibuprofen she is taking as this can cause her GERD/globus sensation to worsen. Discussed the importance of daily stretches. Exercises printed for patient. Follow up in 6 months or sooner with concerns.

## 2021-07-29 NOTE — Assessment & Plan Note (Signed)
Using CPAP. Follows with pulmonology. Continue collaboration and recommendations.

## 2021-07-29 NOTE — Patient Instructions (Addendum)
Call to schedule your mammogram:  [facility] at [facility]  Address: [address], [place], [place] [address]  Phone: [phone]   Low Back Sprain or Strain Rehab Ask your health care provider which exercises are safe for you. Do exercises exactly as told by your health care provider and adjust them as directed. It is normal to feel mild stretching, pulling, tightness, or discomfort as you do these exercises. Stop right away if you feel sudden pain or your pain gets worse. Do not begin these exercises until told by your health care provider. Stretching and range-of-motion exercises These exercises warm up your muscles and joints and improve the movement and flexibility of your back. These exercises also help to relieve pain, numbness, and tingling. Lumbar rotation  Lie on your back on a firm bed or the floor with your knees bent. Straighten your arms out to your sides so each arm forms a 90-degree angle (right angle) with a side of your body. Slowly move (rotate) both of your knees to one side of your body until you feel a stretch in your lower back (lumbar). Try not to let your shoulders lift off the floor. Hold this position for __________ seconds. Tense your abdominal muscles and slowly move your knees back to the starting position. Repeat this exercise on the other side of your body. Repeat __________ times. Complete this exercise __________ times a day. Single knee to chest  Lie on your back on a firm bed or the floor with both legs straight. Bend one of your knees. Use your hands to move your knee up toward your chest until you feel a gentle stretch in your lower back and buttock. Hold your leg in this position by holding on to the front of your knee. Keep your other leg as straight as possible. Hold this position for __________ seconds. Slowly return to the starting position. Repeat with your other leg. Repeat __________ times. Complete this exercise  __________ times a day. Prone extension on elbows  Lie on your abdomen on a firm bed or the floor (prone position). Prop yourself up on your elbows. Use your arms to help lift your chest up until you feel a gentle stretch in your abdomen and your lower back. This will place some of your body weight on your elbows. If this is uncomfortable, try stacking pillows under your chest. Your hips should stay down, against the surface that you are lying on. Keep your hip and back muscles relaxed. Hold this position for __________ seconds. Slowly relax your upper body and return to the starting position. Repeat __________ times. Complete this exercise __________ times a day. Strengthening exercises These exercises build strength and endurance in your back. Endurance is the ability to use your muscles for a long time, even after they get tired. Pelvic tilt This exercise strengthens the muscles that lie deep in the abdomen. Lie on your back on a firm bed or the floor with your legs extended. Bend your knees so they are pointing toward the ceiling and your feet are flat on the floor. Tighten your lower abdominal muscles to press your lower back against the floor. This motion will tilt your pelvis so your tailbone points up toward the ceiling instead of pointing to your feet or the floor. To help with this exercise, you may place a small towel under your lower back and try to push your back into the towel. Hold this position for __________ seconds. Let your  muscles relax completely before you repeat this exercise. Repeat __________ times. Complete this exercise __________ times a day. Alternating arm and leg raises  Get on your hands and knees on a firm surface. If you are on a hard floor, you may want to use padding, such as an exercise mat, to cushion your knees. Line up your arms and legs. Your hands should be directly below your shoulders, and your knees should be directly below your hips. Lift your  left leg behind you. At the same time, raise your right arm and straighten it in front of you. Do not lift your leg higher than your hip. Do not lift your arm higher than your shoulder. Keep your abdominal and back muscles tight. Keep your hips facing the ground. Do not arch your back. Keep your balance carefully, and do not hold your breath. Hold this position for __________ seconds. Slowly return to the starting position. Repeat with your right leg and your left arm. Repeat __________ times. Complete this exercise __________ times a day. Abdominal set with straight leg raise  Lie on your back on a firm bed or the floor. Bend one of your knees and keep your other leg straight. Tense your abdominal muscles and lift your straight leg up, 4-6 inches (10-15 cm) off the ground. Keep your abdominal muscles tight and hold this position for __________ seconds. Do not hold your breath. Do not arch your back. Keep it flat against the ground. Keep your abdominal muscles tense as you slowly lower your leg back to the starting position. Repeat with your other leg. Repeat __________ times. Complete this exercise __________ times a day. Single leg lower with bent knees Lie on your back on a firm bed or the floor. Tense your abdominal muscles and lift your feet off the floor, one foot at a time, so your knees and hips are bent in 90-degree angles (right angles). Your knees should be over your hips and your lower legs should be parallel to the floor. Keeping your abdominal muscles tense and your knee bent, slowly lower one of your legs so your toe touches the ground. Lift your leg back up to return to the starting position. Do not hold your breath. Do not let your back arch. Keep your back flat against the ground. Repeat with your other leg. Repeat __________ times. Complete this exercise __________ times a day. Posture and body mechanics Good posture and healthy body mechanics can help to relieve  stress in your body's tissues and joints. Body mechanics refers to the movements and positions of your body while you do your daily activities. Posture is part of body mechanics. Good posture means: Your spine is in its natural S-curve position (neutral). Your shoulders are pulled back slightly. Your head is not tipped forward (neutral). Follow these guidelines to improve your posture and body mechanics in your everyday activities. Standing  When standing, keep your spine neutral and your feet about hip-width apart. Keep a slight bend in your knees. Your ears, shoulders, and hips should line up. When you do a task in which you stand in one place for a long time, place one foot up on a stable object that is 2-4 inches (5-10 cm) high, such as a footstool. This helps keep your spine neutral. Sitting  When sitting, keep your spine neutral and keep your feet flat on the floor. Use a footrest, if necessary, and keep your thighs parallel to the floor. Avoid rounding your shoulders, and avoid tilting  your head forward. When working at a desk or a computer, keep your desk at a height where your hands are slightly lower than your elbows. Slide your chair under your desk so you are close enough to maintain good posture. When working at a computer, place your monitor at a height where you are looking straight ahead and you do not have to tilt your head forward or downward to look at the screen. Resting When lying down and resting, avoid positions that are most painful for you. If you have pain with activities such as sitting, bending, stooping, or squatting, lie in a position in which your body does not bend very much. For example, avoid curling up on your side with your arms and knees near your chest (fetal position). If you have pain with activities such as standing for a long time or reaching with your arms, lie with your spine in a neutral position and bend your knees slightly. Try the following  positions: Lying on your side with a pillow between your knees. Lying on your back with a pillow under your knees. Lifting  When lifting objects, keep your feet at least shoulder-width apart and tighten your abdominal muscles. Bend your knees and hips and keep your spine neutral. It is important to lift using the strength of your legs, not your back. Do not lock your knees straight out. Always ask for help to lift heavy or awkward objects. This information is not intended to replace advice given to you by your health care provider. Make sure you discuss any questions you have with your health care provider. Document Revised: 11/11/2020 Document Reviewed: 11/11/2020 Elsevier Patient Education  [place].

## 2021-07-29 NOTE — Assessment & Plan Note (Signed)
Follow with GYN and oncology. Continue collaboration and recommendations.

## 2021-07-29 NOTE — Assessment & Plan Note (Signed)
BMI 46 today. Discussed diet and exercise. Goal is to lose 1-2 pounds per week. This in combination with deconditioning can be leading to fatigue and shortness of breath.

## 2021-07-30 LAB — CBC WITH DIFFERENTIAL/PLATELET
Basophils Absolute: 0 10*3/uL (ref 0.0–0.2)
Basos: 1 %
EOS (ABSOLUTE): 0.1 10*3/uL (ref 0.0–0.4)
Eos: 2 %
Hematocrit: 43 % (ref 34.0–46.6)
Hemoglobin: 14 g/dL (ref 11.1–15.9)
Immature Grans (Abs): 0 10*3/uL (ref 0.0–0.1)
Immature Granulocytes: 0 %
Lymphocytes Absolute: 1.8 10*3/uL (ref 0.7–3.1)
Lymphs: 27 %
MCH: 28.2 pg (ref 26.6–33.0)
MCHC: 32.6 g/dL (ref 31.5–35.7)
MCV: 87 fL (ref 79–97)
Monocytes Absolute: 0.5 10*3/uL (ref 0.1–0.9)
Monocytes: 7 %
Neutrophils Absolute: 4.3 10*3/uL (ref 1.4–7.0)
Neutrophils: 63 %
Platelets: 266 10*3/uL (ref 150–450)
RBC: 4.96 x10E6/uL (ref 3.77–5.28)
RDW: 12.9 % (ref 11.7–15.4)
WBC: 6.8 10*3/uL (ref 3.4–10.8)

## 2021-07-30 LAB — VITAMIN D 25 HYDROXY (VIT D DEFICIENCY, FRACTURES): Vit D, 25-Hydroxy: 13 ng/mL — ABNORMAL LOW (ref 30.0–100.0)

## 2021-07-30 LAB — COMPREHENSIVE METABOLIC PANEL
ALT: 6 IU/L (ref 0–32)
AST: 9 IU/L (ref 0–40)
Albumin/Globulin Ratio: 1.1 — ABNORMAL LOW (ref 1.2–2.2)
Albumin: 4 g/dL (ref 3.8–4.9)
Alkaline Phosphatase: 86 IU/L (ref 44–121)
BUN/Creatinine Ratio: 22 (ref 9–23)
BUN: 18 mg/dL (ref 6–24)
Bilirubin Total: 0.5 mg/dL (ref 0.0–1.2)
CO2: 24 mmol/L (ref 20–29)
Calcium: 9.1 mg/dL (ref 8.7–10.2)
Chloride: 102 mmol/L (ref 96–106)
Creatinine, Ser: 0.82 mg/dL (ref 0.57–1.00)
Globulin, Total: 3.7 g/dL (ref 1.5–4.5)
Glucose: 106 mg/dL — ABNORMAL HIGH (ref 70–99)
Potassium: 4.1 mmol/L (ref 3.5–5.2)
Sodium: 138 mmol/L (ref 134–144)
Total Protein: 7.7 g/dL (ref 6.0–8.5)
eGFR: 83 mL/min/{1.73_m2} (ref >59)

## 2021-07-30 LAB — HEPATITIS C ANTIBODY: Hep C Virus Ab: <0.1 s/co ratio (ref 0.0–0.9)

## 2021-07-30 LAB — LIPID PANEL W/O CHOL/HDL RATIO
Cholesterol, Total: 179 mg/dL (ref 100–199)
HDL: 64 mg/dL (ref >39)
LDL Chol Calc (NIH): 101 mg/dL — ABNORMAL HIGH (ref 0–99)
Triglycerides: 78 mg/dL (ref 0–149)
VLDL Cholesterol Cal: 14 mg/dL (ref 5–40)

## 2021-07-30 LAB — HIV ANTIBODY (ROUTINE TESTING W REFLEX): HIV Screen 4th Generation wRfx: NONREACTIVE

## 2021-08-20 LAB — COLOGUARD: COLOGUARD: NEGATIVE

## 2021-10-07 ENCOUNTER — Encounter: Payer: Self-pay | Admitting: Nurse Practitioner

## 2021-10-13 ENCOUNTER — Encounter: Payer: Self-pay | Admitting: Internal Medicine

## 2021-10-14 ENCOUNTER — Ambulatory Visit (INDEPENDENT_AMBULATORY_CARE_PROVIDER_SITE_OTHER): Payer: Medicare Other | Admitting: Internal Medicine

## 2021-10-14 ENCOUNTER — Encounter: Payer: Self-pay | Admitting: Internal Medicine

## 2021-10-14 ENCOUNTER — Other Ambulatory Visit: Payer: Self-pay

## 2021-10-14 VITALS — BP 109/74 | HR 81 | Temp 97.8°F | Ht 62.01 in | Wt 246.0 lb

## 2021-10-14 DIAGNOSIS — N939 Abnormal uterine and vaginal bleeding, unspecified: Secondary | ICD-10-CM | POA: Insufficient documentation

## 2021-10-14 DIAGNOSIS — R350 Frequency of micturition: Secondary | ICD-10-CM | POA: Insufficient documentation

## 2021-10-14 DIAGNOSIS — R319 Hematuria, unspecified: Secondary | ICD-10-CM | POA: Diagnosis not present

## 2021-10-14 DIAGNOSIS — R309 Painful micturition, unspecified: Secondary | ICD-10-CM | POA: Diagnosis not present

## 2021-10-14 LAB — MICROSCOPIC EXAMINATION

## 2021-10-14 LAB — URINALYSIS, ROUTINE W REFLEX MICROSCOPIC
Bilirubin, UA: NEGATIVE
Glucose, UA: NEGATIVE
Ketones, UA: NEGATIVE
Nitrite, UA: NEGATIVE
Specific Gravity, UA: 1.025 (ref 1.005–1.030)
Urobilinogen, Ur: 1 mg/dL (ref 0.2–1.0)
pH, UA: 6 (ref 5.0–7.5)

## 2021-10-14 MED ORDER — SULFAMETHOXAZOLE-TRIMETHOPRIM 800-160 MG PO TABS: 1.0000 | ORAL_TABLET | Freq: Two times a day (BID) | ORAL | 0 refills | Status: AC

## 2021-10-14 NOTE — Progress Notes (Signed)
BP 109/74    Pulse 81    Temp 97.8 F (36.6 C) (Oral)    Ht 5' 2.01" (1.575 m)    Wt 246 lb (111.6 kg)    LMP  (LMP Unknown)    SpO2 98%    BMI 44.98 kg/m    Subjective:    Patient ID: Allison Dixon, female    DOB: [birth year], 61 y.o.   MRN: [id number]  Chief Complaint  Patient presents with   Urinary Frequency    For past week    HPI: Allison Dixon is a 61 y.o. female  Urinary Frequency  This is a new problem. The current episode started 1 to 4 weeks ago. The problem has been gradually worsening. The quality of the pain is described as burning. The pain is at a severity of 4/10. There has been no fever. Associated symptoms include frequency, hematuria and urgency. Pertinent negatives include no chills, discharge, flank pain, hesitancy, nausea, possible pregnancy, sweats or vomiting.  Hypertension This is a chronic problem. The current episode started more than 1 year ago. The problem has been resolved since onset. The problem is controlled. Pertinent negatives include no anxiety, blurred vision, chest pain, headaches, malaise/fatigue, neck pain, orthopnea, palpitations, peripheral edema, PND, shortness of breath or sweats.  Vaginal Bleeding The patient's primary symptoms include vaginal bleeding. The patient's pertinent negatives include no genital itching, genital lesions, genital odor, genital rash, missed menses, pelvic pain or vaginal discharge. This is a new problem. The current episode started yesterday. Associated symptoms include frequency, hematuria and urgency. Pertinent negatives include no chills, dysuria, flank pain, headaches, joint swelling, nausea or vomiting.   Chief Complaint  Patient presents with   Urinary Frequency    For past week    Relevant past medical, surgical, family and social history reviewed and updated as indicated. Interim medical history since our last visit reviewed. Allergies and medications reviewed and updated.  Review of Systems   Constitutional:  Negative for chills and malaise/fatigue.  Eyes:  Negative for blurred vision.  Respiratory:  Negative for shortness of breath.   Cardiovascular:  Negative for chest pain, palpitations, orthopnea and PND.  Gastrointestinal:  Negative for nausea and vomiting.  Genitourinary:  Positive for frequency, hematuria, urgency and vaginal bleeding. Negative for dysuria, flank pain, hesitancy, missed menses, pelvic pain and vaginal discharge.  Musculoskeletal:  Negative for neck pain.  Neurological:  Negative for headaches.   Per HPI unless specifically indicated above     Objective:    BP 109/74    Pulse 81    Temp 97.8 F (36.6 C) (Oral)    Ht 5' 2.01" (1.575 m)    Wt 246 lb (111.6 kg)    LMP  (LMP Unknown)    SpO2 98%    BMI 44.98 kg/m   Wt Readings from Last 3 Encounters:  10/14/21 246 lb (111.6 kg)  07/29/21 255 lb (115.7 kg)  06/25/21 247 lb 12.8 oz (112.4 kg)    Physical Exam Vitals and nursing note reviewed.  Constitutional:      General: She is not in acute distress.    Appearance: Normal appearance. She is not ill-appearing or diaphoretic.  Eyes:     Conjunctiva/sclera: Conjunctivae normal.  Pulmonary:     Breath sounds: No rhonchi.  Abdominal:     General: Abdomen is flat. Bowel sounds are normal. There is no distension.     Palpations: Abdomen is soft. There is no mass.  Tenderness: There is no abdominal tenderness. There is no guarding.  Skin:    General: Skin is warm and dry.     Coloration: Skin is not jaundiced.     Findings: No erythema.  Neurological:     Mental Status: She is alert.    Results for orders placed or performed in visit on 10/14/21  Microscopic Examination   Urine  Result Value Ref Range   WBC, UA 11-30 (A) 0 - 5 /hpf   RBC 0-2 0 - 2 /hpf   Epithelial Cells (non renal) 0-10 0 - 10 /hpf   Mucus, UA Present (A) Not Estab.   Bacteria, UA Moderate (A) None seen/Few  Urinalysis, Routine w reflex microscopic  Result Value Ref  Range   Specific Gravity, UA 1.025 1.005 - 1.030   pH, UA 6.0 5.0 - 7.5   Color, UA Yellow Yellow   Appearance Ur Cloudy (A) Clear   Leukocytes,UA 3+ (A) Negative   Protein,UA Trace (A) Negative/Trace   Glucose, UA Negative Negative   Ketones, UA Negative Negative   RBC, UA Trace (A) Negative   Bilirubin, UA Negative Negative   Urobilinogen, Ur 1.0 0.2 - 1.0 mg/dL   Nitrite, UA Negative Negative   Microscopic Examination See below:         Current Outpatient Medications:    cyclobenzaprine (FLEXERIL) 10 MG tablet, Take 1 tablet (10 mg total) by mouth at bedtime as needed for muscle spasms., Disp: 30 tablet, Rfl: 0   ibuprofen (ADVIL) 800 MG tablet, Take 1 tablet (800 mg total) by mouth every 8 (eight) hours as needed., Disp: 60 tablet, Rfl: 1   oxyCODONE-acetaminophen (PERCOCET) 10-325 MG tablet, Take 1 tablet by mouth every 4 (four) hours as needed for pain., Disp: 30 tablet, Rfl: 0   sulfamethoxazole-trimethoprim (BACTRIM DS) 800-160 MG tablet, Take 1 tablet by mouth 2 (two) times daily for 5 days., Disp: 10 tablet, Rfl: 0   amLODipine (NORVASC) 5 MG tablet, Take 1 tablet (5 mg total) by mouth daily., Disp: 180 tablet, Rfl: 3   famotidine (PEPCID) 20 MG tablet, Take 1 tablet (20 mg total) by mouth daily. (Patient not taking: Reported on 07/29/2021), Disp: 14 tablet, Rfl: 0   fluticasone (FLONASE) 50 MCG/ACT nasal spray, Place 2 sprays into both nostrils daily. (Patient not taking: Reported on 10/14/2021), Disp: , Rfl:    omeprazole (PRILOSEC) 10 MG capsule, Take 10 mg by mouth daily. (Patient not taking: Reported on 07/29/2021), Disp: , Rfl:    pantoprazole (PROTONIX) 40 MG tablet, SMARTSIG:1 Tablet(s) By Mouth Every Evening (Patient not taking: Reported on 10/14/2021), Disp: , Rfl:     Assessment & Plan:  Htn is on norvasc for such Continue current meds.  Medication compliance emphasised. pt advised to keep Bp logs. Pt verbalised understanding of the same. Pt to have a low salt diet .  Exercise to reach a goal of at least 150 mins a week.  lifestyle modifications explained and pt understands importance of the above. Under good control on current regimen. Continue current regimen. Continue to monitor. Call with any concerns. Refills given. Labs drawn today.   2. GERD is on pepcid for such  patient advised to avoid laying down soon after his meals. He took a 2 hours between dinner and bedtime. Avoid spicy food and triggers that he knows food wise that worsen his acid reflux. Patient verbalized understanding of the above. Lifestyle modifications as above discussed with patient.   3. Vaginal spotting s/p endometrial  cancer - has had  TAH and BSO Will need to fu with obgyn for such    Problem List Items Addressed This Visit       Other   Frequency of urination - Primary   Relevant Orders   Urinalysis, Routine w reflex microscopic (Completed)   Urine Culture   Urination pain   Relevant Orders   Urinalysis, Routine w reflex microscopic (Completed)   Urine Culture   Hematuria   Relevant Orders   DG Abd 1 View   Ambulatory referral to Obstetrics / Gynecology   Vaginal spotting   Relevant Orders   Ambulatory referral to Obstetrics / Gynecology     Orders Placed This Encounter  Procedures   Urine Culture   Microscopic Examination   DG Abd 1 View   Urinalysis, Routine w reflex microscopic   Ambulatory referral to Obstetrics / Gynecology     Meds ordered this encounter  Medications   sulfamethoxazole-trimethoprim (BACTRIM DS) 800-160 MG tablet    Sig: Take 1 tablet by mouth 2 (two) times daily for 5 days.    Dispense:  10 tablet    Refill:  0     Follow up plan: No follow-ups on file.

## 2021-10-16 LAB — URINE CULTURE

## 2021-10-17 ENCOUNTER — Encounter: Payer: Self-pay | Admitting: Obstetrics and Gynecology

## 2021-10-29 ENCOUNTER — Telehealth: Payer: Self-pay

## 2021-10-29 NOTE — Telephone Encounter (Signed)
SCHEDULED FOR 01/22/2022

## 2021-11-20 IMAGING — DX DG LUMBAR SPINE COMPLETE 4+V
5 series · 5 of 5 positions shown · non-contrast
Comparison: None.

CLINICAL DATA: Chronic low back pain, right leg numbness

EXAM:
LUMBAR SPINE - COMPLETE 4+ VIEW

[l-spine ap]
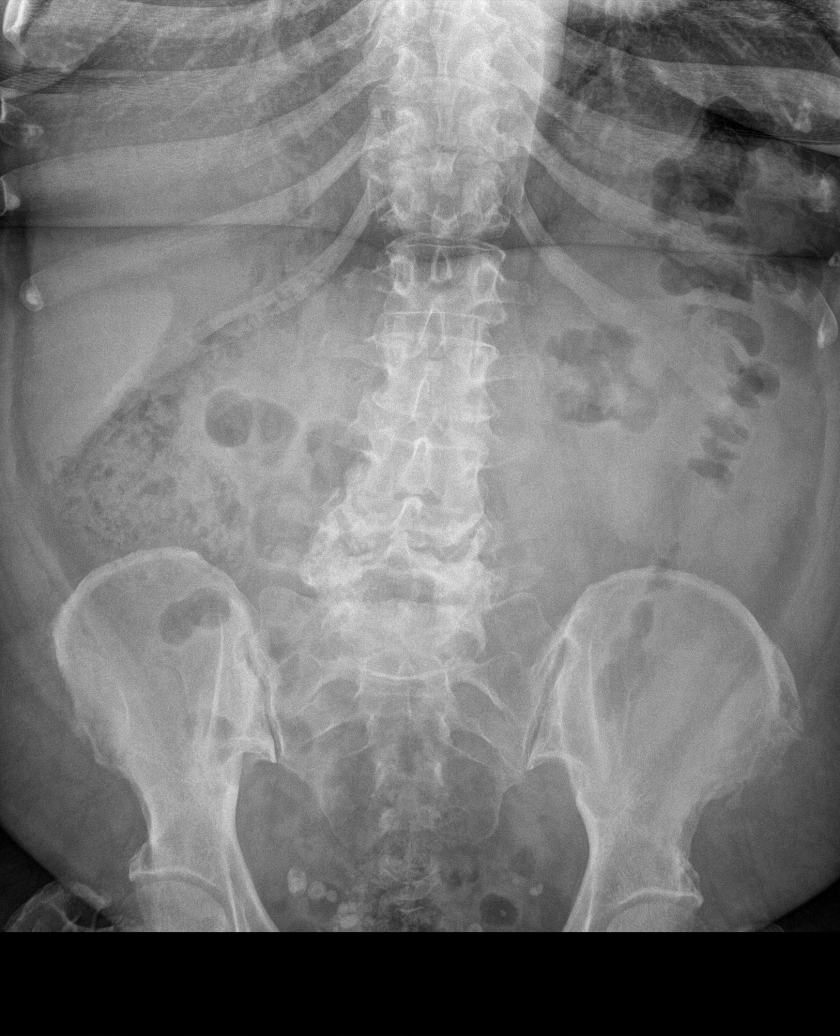

[l-spine obl (1 of 2)]
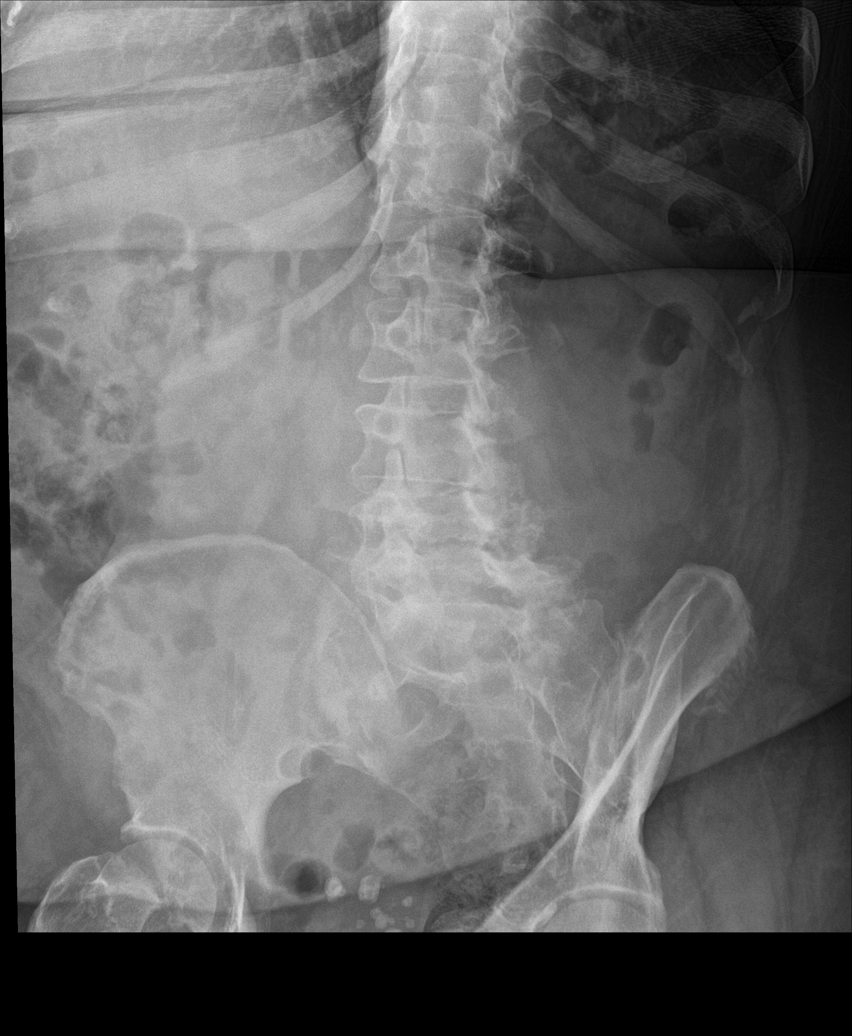

[l-spine obl (2 of 2)]
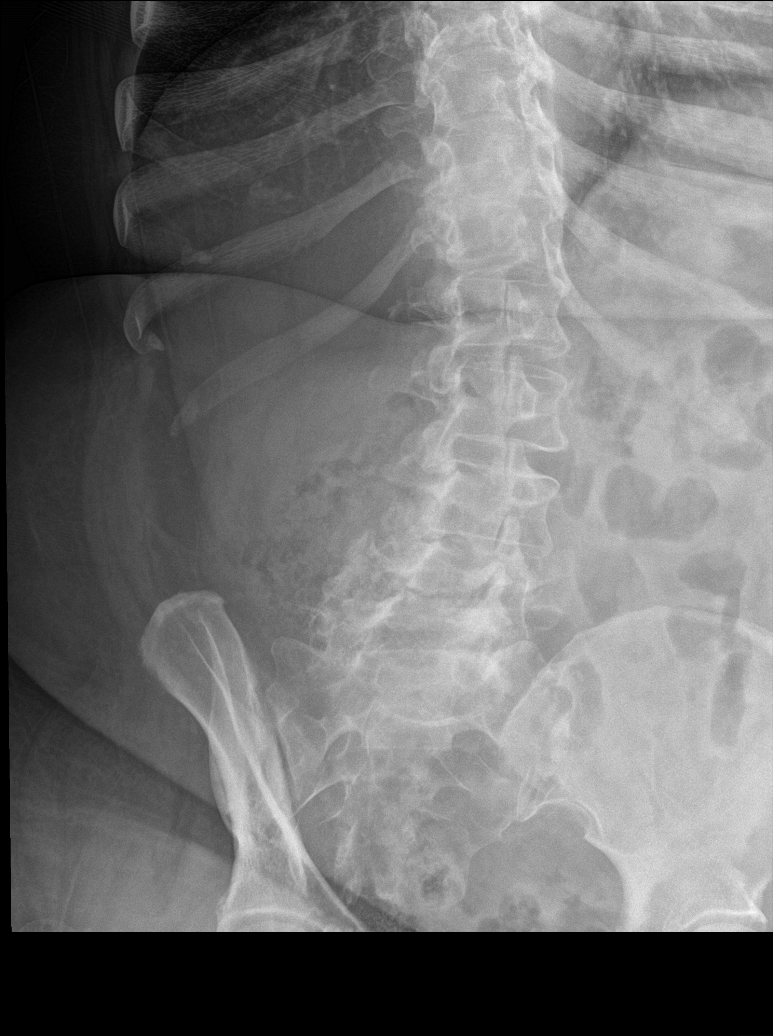

[l-spine lat]
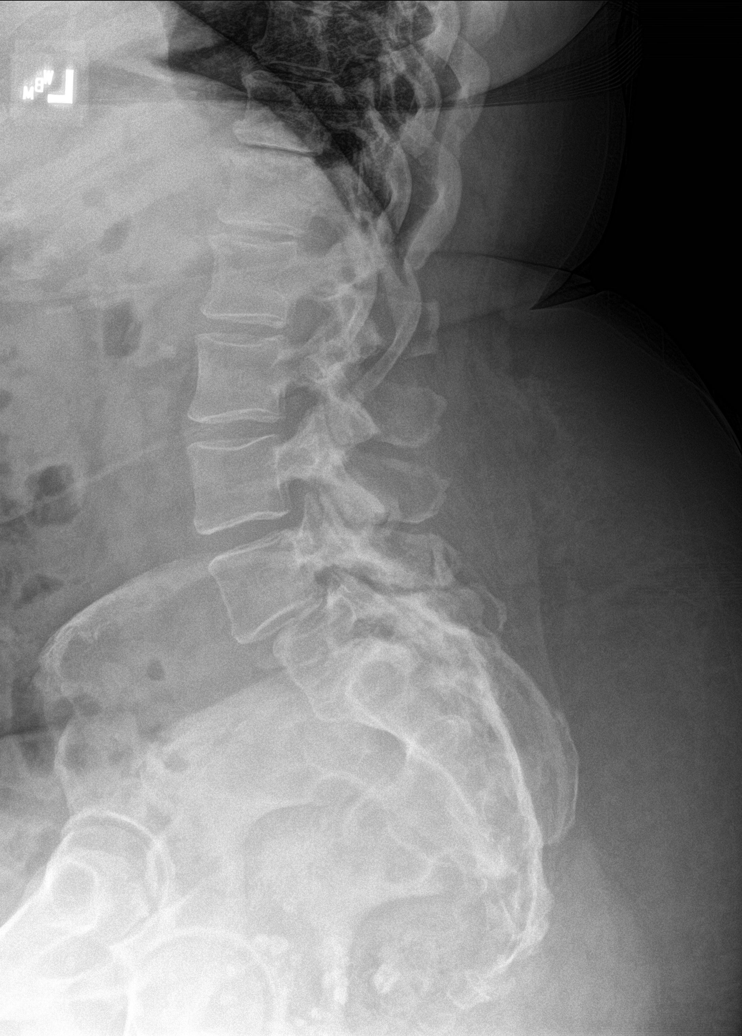

[l-spine spot]
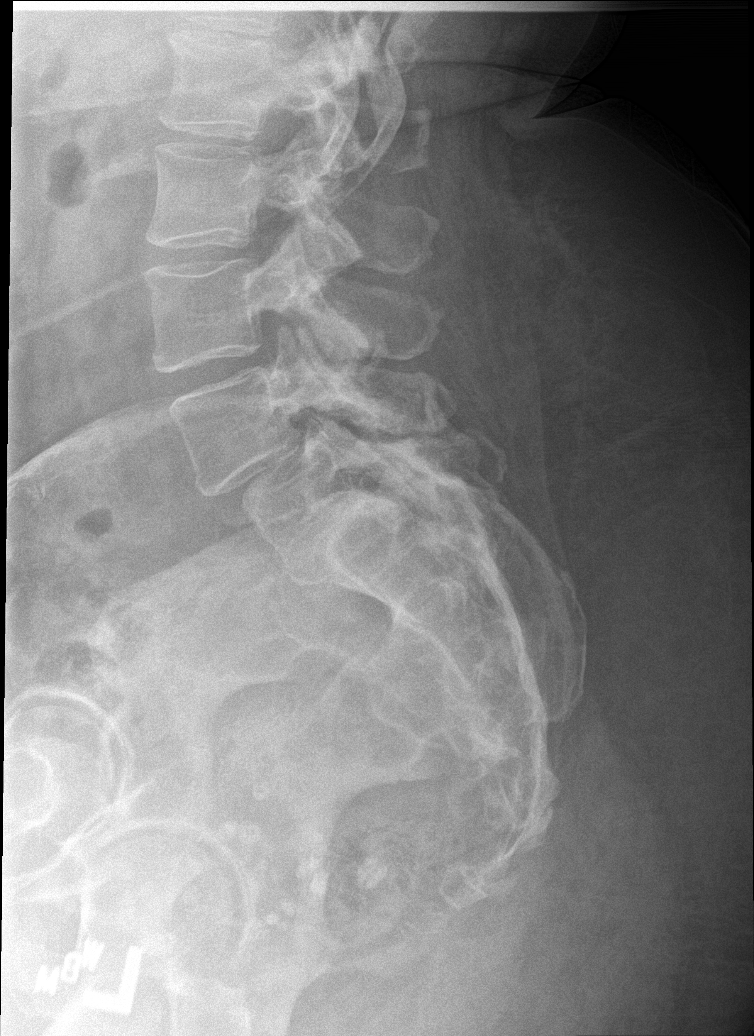

[5 of 5 positions shown; findings below may reference images not displayed]

FINDINGS: Frontal, bilateral oblique, lateral views of the lumbar spine are
obtained. There are 5 non-rib-bearing lumbar type vertebral bodies.
Grade 1 anterolisthesis of L4 on L5 likely due to facet hypertrophy.
No evidence of pars defect. Mild left convex curvature centered at
L1. No acute fractures. There is severe spondylosis and facet
hypertrophy at L4-5 and to a greater extent L5-S1. Sacroiliac joints
are normal.
IMPRESSION: 1. Severe spondylosis and facet hypertrophy at L4-5 and L5-S1.
2. Grade 1 anterolisthesis of L4 on L5, likely due to facet
hypertrophy. No pars defect.
3. Mild left convex scoliosis.

## 2021-12-16 ENCOUNTER — Encounter: Payer: Medicare Other | Admitting: Obstetrics and Gynecology

## 2021-12-29 ENCOUNTER — Encounter: Payer: Self-pay | Admitting: Podiatry

## 2021-12-30 ENCOUNTER — Other Ambulatory Visit: Payer: Self-pay | Admitting: Podiatry

## 2021-12-30 ENCOUNTER — Encounter: Payer: Self-pay | Admitting: Internal Medicine

## 2021-12-30 MED ORDER — GABAPENTIN 100 MG PO CAPS: 100.0000 mg | ORAL_CAPSULE | Freq: Three times a day (TID) | ORAL | 3 refills | Status: DC

## 2022-01-22 ENCOUNTER — Encounter: Payer: Self-pay | Admitting: Gastroenterology

## 2022-01-22 ENCOUNTER — Ambulatory Visit (INDEPENDENT_AMBULATORY_CARE_PROVIDER_SITE_OTHER): Payer: Medicare Other | Admitting: Gastroenterology

## 2022-01-22 VITALS — BP 148/84 | HR 71 | Temp 98.7°F | Ht 62.0 in | Wt 245.0 lb

## 2022-01-22 DIAGNOSIS — R1310 Dysphagia, unspecified: Secondary | ICD-10-CM

## 2022-01-22 NOTE — Progress Notes (Signed)
Gastroenterology Consultation  Referring Provider:     Clyde Canterbury, MD Primary Care Physician:  Charlynne Cousins, MD Primary Gastroenterologist:  Dr. Allen Norris     Reason for Consultation:     Dysphagia        HPI:   Allison Dixon is a 62 y.o. y/o female referred for consultation & management of dysphagia by Dr. Charlynne Cousins, MD. This patient comes to see me with a history of heartburn and was seen by ENT for dysphagia.  The patient had a Cologuard test that was negative in 2022.  The patient was reported to have a globus type dysphagia despite antireflux medication.  It was recommended the patient undergo a GI evaluation or possibly a barium swallow.  It was reported by the ENT physician that she had agreed to being evaluated by GI for her symptoms. The patient reports that her symptoms are worse with solids than it is with liquids.  She denies any unexplained weight loss fevers chills nausea vomiting black stools or bloody stools.   Past Medical History:  Diagnosis Date   Cancer ([place]) 2021   Uterine    COVID    Endometrial cancer ([place])    Hypertension    Morbid obesity ([place])    Sleep apnea     Past Surgical History:  Procedure Laterality Date   DILATION AND CURETTAGE, DIAGNOSTIC / THERAPEUTIC     EXPLORATORY LAPAROTOMY     for ectopic pregnancy   TOTAL LAPAROSCOPIC HYSTERECTOMY WITH BILATERAL SALPINGO OOPHORECTOMY Bilateral 05/22/2020   Procedure: TOTAL LAPAROSCOPIC HYSTERECTOMY WITH BILATERAL SALPINGO OOPHORECTOMY AND WASHINGS, PERINEAL BIOPSY;  Surgeon: Mellody Drown, MD;  Location: [facility] ORS;  Service: Gynecology;  Laterality: Bilateral;    Prior to Admission medications   Medication Sig Start Date End Date Taking? Authorizing Provider  amLODipine (NORVASC) 5 MG tablet Take 1 tablet (5 mg total) by mouth daily. 01/17/21 07/29/21  Rise Mu, PA-C  cyclobenzaprine (FLEXERIL) 10 MG tablet Take 1 tablet (10 mg total) by mouth at bedtime as needed for muscle spasms. 03/24/21    McElwee, Lauren A, NP  famotidine (PEPCID) 20 MG tablet Take 1 tablet (20 mg total) by mouth daily. Patient not taking: Reported on 07/29/2021 02/17/21   Charyl Dancer, NP  fluticasone (FLONASE) 50 MCG/ACT nasal spray Place 2 sprays into both nostrils daily. Patient not taking: Reported on 10/14/2021 05/23/21   [provider]  gabapentin (NEURONTIN) 100 MG capsule Take 1 capsule (100 mg total) by mouth 3 (three) times daily. 12/30/21   Felipa Furnace, DPM  ibuprofen (ADVIL) 800 MG tablet Take 1 tablet (800 mg total) by mouth every 8 (eight) hours as needed. 07/29/21   McElwee, Lauren A, NP  omeprazole (PRILOSEC) 10 MG capsule Take 10 mg by mouth daily. Patient not taking: Reported on 07/29/2021    [provider]  oxyCODONE-acetaminophen (PERCOCET) 10-325 MG tablet Take 1 tablet by mouth every 4 (four) hours as needed for pain. 07/09/21   Felipa Furnace, DPM  pantoprazole (PROTONIX) 40 MG tablet SMARTSIG:1 Tablet(s) By Mouth Every Evening Patient not taking: Reported on 10/14/2021 07/24/21   [provider]    Family History  Problem Relation Age of Onset   Heart attack Mother    Cancer Mother    Cancer Father      Social History   Tobacco Use   Smoking status: Never   Smokeless tobacco: Never  Vaping Use   Vaping Use: Never used  Substance Use  Topics   Alcohol use: Never   Drug use: Never    Allergies as of 01/22/2022   (No Known Allergies)    Review of Systems:    All systems reviewed and negative except where noted in HPI.   Physical Exam:  LMP  (LMP Unknown)  No LMP recorded (lmp unknown). Patient has had a hysterectomy. General:   Alert,  Well-developed, well-nourished, pleasant and cooperative in NAD Head:  Normocephalic and atraumatic. Eyes:  Sclera clear, no icterus.   Conjunctiva pink. Ears:  Normal auditory acuity. Neck:  Supple; no masses or thyromegaly. Lungs:  Respirations even and unlabored.  Clear throughout to auscultation.   No  wheezes, crackles, or rhonchi. No acute distress. Heart:  Regular rate and rhythm; no murmurs, clicks, rubs, or gallops. Abdomen:  Normal bowel sounds.  No bruits.  Soft, non-tender and non-distended without masses, hepatosplenomegaly or hernias noted.  No guarding or rebound tenderness.  Negative Carnett sign.   Rectal:  Deferred.  Pulses:  Normal pulses noted. Extremities:  No clubbing or edema.  No cyanosis. Neurologic:  Alert and oriented x3;  grossly normal neurologically. Skin:  Intact without significant lesions or rashes.  No jaundice. Lymph Nodes:  No significant cervical adenopathy. Psych:  Alert and cooperative. Normal mood and affect.  Imaging Studies: No results found.  Assessment and Plan:   Allison Dixon is a 61 y.o. y/o female who comes in today with a history of dysphagia.  The patient was seen by ENT and sent for evaluation by me.  The patient will be set up for an upper endoscopy to look for possible strictures or narrowing as the cause of her dysphagia.  The patient has been explained the plan and agrees with it.    Lucilla Lame, MD. Marval Regal    Note: This dictation was prepared with Dragon dictation along with smaller phrase technology. Any transcriptional errors that result from this process are unintentional.

## 2022-01-26 ENCOUNTER — Ambulatory Visit: Payer: Medicare Other | Admitting: Internal Medicine

## 2022-01-27 ENCOUNTER — Ambulatory Visit
Admission: RE | Admit: 2022-01-27 | Discharge: 2022-01-27 | Disposition: A | Discharge status: Home / Self Care | Payer: Medicare Other | Attending: Internal Medicine | Admitting: Internal Medicine

## 2022-01-27 ENCOUNTER — Encounter: Payer: Self-pay | Admitting: Internal Medicine

## 2022-01-27 ENCOUNTER — Ambulatory Visit (INDEPENDENT_AMBULATORY_CARE_PROVIDER_SITE_OTHER): Payer: Medicare Other | Admitting: Internal Medicine

## 2022-01-27 ENCOUNTER — Ambulatory Visit
Admission: RE | Admit: 2022-01-27 | Discharge: 2022-01-27 | Disposition: A | Discharge status: Home / Self Care | Payer: Medicare Other | Source: Ambulatory Visit | Attending: Internal Medicine | Admitting: Internal Medicine

## 2022-01-27 VITALS — BP 137/84 | HR 67 | Temp 97.9°F | Wt 245.0 lb

## 2022-01-27 DIAGNOSIS — M5441 Lumbago with sciatica, right side: Secondary | ICD-10-CM | POA: Diagnosis present

## 2022-01-27 DIAGNOSIS — G8929 Other chronic pain: Secondary | ICD-10-CM | POA: Insufficient documentation

## 2022-01-27 DIAGNOSIS — I10 Essential (primary) hypertension: Secondary | ICD-10-CM | POA: Diagnosis not present

## 2022-01-27 DIAGNOSIS — R0602 Shortness of breath: Secondary | ICD-10-CM | POA: Diagnosis not present

## 2022-01-27 DIAGNOSIS — R0600 Dyspnea, unspecified: Secondary | ICD-10-CM | POA: Insufficient documentation

## 2022-01-27 DIAGNOSIS — R011 Cardiac murmur, unspecified: Secondary | ICD-10-CM | POA: Diagnosis not present

## 2022-01-27 LAB — URINALYSIS, ROUTINE W REFLEX MICROSCOPIC
Bilirubin, UA: NEGATIVE
Glucose, UA: NEGATIVE
Ketones, UA: NEGATIVE
Leukocytes,UA: NEGATIVE
Nitrite, UA: NEGATIVE
Protein,UA: NEGATIVE
RBC, UA: NEGATIVE
Specific Gravity, UA: 1.02 (ref 1.005–1.030)
Urobilinogen, Ur: 2 mg/dL — ABNORMAL HIGH (ref 0.2–1.0)
pH, UA: 6 (ref 5.0–7.5)

## 2022-01-27 MED ORDER — GABAPENTIN 300 MG PO CAPS
300.0000 mg | ORAL_CAPSULE | Freq: Every day | ORAL | 3 refills | Status: DC
Start: 2022-01-27 — End: 2022-08-04

## 2022-01-27 NOTE — Progress Notes (Signed)
BP 137/84   Pulse 67   Temp 97.9 F (36.6 C) (Oral)   Wt 245 lb (111.1 kg)   LMP  (LMP Unknown)   SpO2 96%   BMI 44.81 kg/m    Subjective:    Patient ID: Allison Dixon, female    DOB: 06/07/1961, 61 y.o.   MRN: [id number]  Chief Complaint  Patient presents with  . Hypertension  . Prediabetes    HPI: Allison Dixon is a 61 y.o. female  Hypertension This is a chronic problem. The problem is controlled. Pertinent negatives include no anxiety, blurred vision, chest pain, headaches, malaise/fatigue, neck pain, orthopnea, palpitations, peripheral edema, PND or shortness of breath.  Back Pain This is a chronic problem. Pertinent negatives include no abdominal pain, chest pain or headaches.  Gastroesophageal Reflux She complains of heartburn. She reports no abdominal pain, no belching, no chest pain, no choking, no coughing, no dysphagia, no globus sensation, no hoarse voice, no nausea, no stridor, no tooth decay or no wheezing.  Chief Complaint  Patient presents with  . Hypertension  . Prediabetes    Relevant past medical, surgical, family and social history reviewed and updated as indicated. Interim medical history since our last visit reviewed. Allergies and medications reviewed and updated.  Review of Systems  Constitutional:  Negative for malaise/fatigue.  HENT:  Negative for hoarse voice.   Eyes:  Negative for blurred vision.  Respiratory:  Negative for cough, choking, shortness of breath and wheezing.   Cardiovascular:  Negative for chest pain, palpitations, orthopnea and PND.  Gastrointestinal:  Positive for heartburn. Negative for abdominal pain, dysphagia and nausea.  Musculoskeletal:  Positive for back pain. Negative for neck pain.  Neurological:  Negative for headaches.   Per HPI unless specifically indicated above     Objective:    BP 137/84   Pulse 67   Temp 97.9 F (36.6 C) (Oral)   Wt 245 lb (111.1 kg)   LMP  (LMP Unknown)   SpO2 96%   BMI 44.81  kg/m   Wt Readings from Last 3 Encounters:  01/27/22 245 lb (111.1 kg)  01/22/22 245 lb (111.1 kg)  10/14/21 246 lb (111.6 kg)    Physical Exam  Results for orders placed or performed in visit on 10/14/21  Urine Culture   Specimen: Urine   UR  Result Value Ref Range   Urine Culture, Routine Final report (A)    Organism ID, Bacteria Escherichia coli (A)    Antimicrobial Susceptibility Comment   Microscopic Examination   Urine  Result Value Ref Range   WBC, UA 11-30 (A) 0 - 5 /hpf   RBC 0-2 0 - 2 /hpf   Epithelial Cells (non renal) 0-10 0 - 10 /hpf   Mucus, UA Present (A) Not Estab.   Bacteria, UA Moderate (A) None seen/Few  Urinalysis, Routine w reflex microscopic  Result Value Ref Range   Specific Gravity, UA 1.025 1.005 - 1.030   pH, UA 6.0 5.0 - 7.5   Color, UA Yellow Yellow   Appearance Ur Cloudy (A) Clear   Leukocytes,UA 3+ (A) Negative   Protein,UA Trace (A) Negative/Trace   Glucose, UA Negative Negative   Ketones, UA Negative Negative   RBC, UA Trace (A) Negative   Bilirubin, UA Negative Negative   Urobilinogen, Ur 1.0 0.2 - 1.0 mg/dL   Nitrite, UA Negative Negative   Microscopic Examination See below:         Current Outpatient Medications:  .  fluticasone (FLONASE) 50 MCG/ACT nasal spray, Place 2 sprays into both nostrils daily., Disp: , Rfl:  .  ibuprofen (ADVIL) 800 MG tablet, Take 1 tablet (800 mg total) by mouth every 8 (eight) hours as needed., Disp: 60 tablet, Rfl: 1 .  amLODipine (NORVASC) 5 MG tablet, Take 1 tablet (5 mg total) by mouth daily., Disp: 180 tablet, Rfl: 3 .  gabapentin (NEURONTIN) 300 MG capsule, Take 1 capsule (300 mg total) by mouth at bedtime., Disp: 30 capsule, Rfl: 3    Assessment & Plan:  Htn is on norvasc for such Continue current meds.  Medication compliance emphasised. pt advised to keep Bp logs. Pt verbalised understanding of the same. Pt to have a low salt diet . Exercise to reach a goal of at least 150 mins a week.   lifestyle modifications explained and pt understands importance of the above. Under good control on current regimen. Continue current regimen. Continue to monitor. Call with any concerns. Refills given. Labs drawn today.     2. GERD is on pepcid for such to have an endoscopy for such per GI  patient advised to avoid laying down soon after his meals. He took a 2 hours between dinner and bedtime. Avoid spicy food and triggers that he knows food wise that worsen his acid reflux. Patient verbalized understanding of the above. Lifestyle modifications as above discussed with patient.   3, back pain is on Neurontin 100 mg tid for such change to 30 mg q hd needs to see ortho Back pain :  probably secondary to muscle spasms relieved with ibuprofen as prescribed in the ER UA negative no UTI patient does not have any symptoms either. will need ? MRI of back if pain persists. most likely musckulskelteal though adviced streches for back Patient advised to take medication as directed here. Patient advised to rest initially and then slowly increase activity level. Monitor changes in symptoms such as numbness, tingling or weakness in legs, changes in bowel or bladder habits or worsening back pain. Proper ergonomics discussed. Referral to physical therapy as needed. Patient will call if symptoms worsen or if pain persists greater than 8 weeks.  Carediac murmer with [facility] :  Will refer to cardiology.  Problem List Items Addressed This Visit       Cardiovascular and Mediastinum   Primary hypertension - Primary   Relevant Orders   CBC with Differential/Platelet   Comprehensive metabolic panel   TSH   Urinalysis, Routine w reflex microscopic     Nervous and Auditory   Chronic bilateral low back pain with right-sided sciatica   Relevant Medications   gabapentin (NEURONTIN) 300 MG capsule   Other Relevant Orders   CBC with Differential/Platelet   Comprehensive metabolic panel   TSH   Urinalysis,  Routine w reflex microscopic   Ambulatory referral to Orthopedics   DG Lumbar Spine Complete     Other   SOB (shortness of breath)   Cardiac murmur   Relevant Orders   Ambulatory referral to Cardiology     Orders Placed This Encounter  Procedures  . DG Lumbar Spine Complete  . CBC with Differential/Platelet  . Comprehensive metabolic panel  . TSH  . Urinalysis, Routine w reflex microscopic  . Ambulatory referral to Orthopedics  . Ambulatory referral to Cardiology     Meds ordered this encounter  Medications  . gabapentin (NEURONTIN) 300 MG capsule    Sig: Take 1 capsule (300 mg total) by mouth at bedtime.  Dispense:  30 capsule    Refill:  3     Follow up plan: Return in about 6 months (around 07/30/2022).

## 2022-01-27 NOTE — Patient Instructions (Addendum)
Please call to schedule your mammogram and/or bone density: [facility]: [address], [place], [place] [address] Phone: [phone] Food Choices for Gastroesophageal Reflux Disease, Adult When you have gastroesophageal reflux disease (GERD), the foods you eat and your eating habits are very important. Choosing the right foods can help ease your discomfort. Think about working with a food expert (dietitian) to help you make good choices. What are tips for following this plan? Reading food labels Look for foods that are low in saturated fat. Foods that may help with your symptoms include: Foods that have less than 5% of daily value (DV) of fat. Foods that have 0 grams of trans fat. Cooking Do not fry your food. Cook your food by baking, steaming, grilling, or broiling. These are all methods that do not need a lot of fat for cooking. To add flavor, try to use herbs that are low in spice and acidity. Meal planning  Choose healthy foods that are low in fat, such as: Fruits and vegetables. Whole grains. Low-fat dairy products. Lean meats, fish, and poultry. Eat small meals often instead of eating 3 large meals each day. Eat your meals slowly in a place where you are relaxed. Avoid bending over or lying down until 2-3 hours after eating. Limit high-fat foods such as fatty meats or fried foods. Limit your intake of fatty foods, such as oils, butter, and shortening. Avoid the following as told by your doctor: Foods that cause symptoms. These may be different for different people. Keep a food diary to keep track of foods that cause symptoms. Alcohol. Drinking a lot of liquid with meals. Eating meals during the 2-3 hours before bed. Lifestyle Stay at a healthy weight. Ask your doctor what weight is healthy for you. If you need to lose weight, work with your doctor to do so safely. Exercise for at least 30 minutes on 5 or more days each  week, or as told by your doctor. Wear loose-fitting clothes. Do not smoke or use any products that contain nicotine or tobacco. If you need help quitting, ask your doctor. Sleep with the head of your bed higher than your feet. Use a wedge under the mattress or blocks under the bed frame to raise the head of the bed. Chew sugar-free gum after meals. What foods should eat?  Eat a healthy, well-balanced diet of fruits, vegetables, whole grains, low-fat dairy products, lean meats, fish, and poultry. Each person is different. Foods that may cause symptoms in one person may not cause any symptoms in another person. Work with your doctor to find foods that are safe for you. The items listed above may not be a complete list of what you can eat and drink. Contact a food expert for more options. What foods should I avoid? Limiting some of these foods may help in managing the symptoms of GERD. Everyone is different. Talk with a food expert or your doctor to help you find the exact foods to avoid, if any. Fruits Any fruits prepared with added fat. Any fruits that cause symptoms. For some people, this may include citrus fruits, such as oranges, grapefruit, pineapple, and lemons. Vegetables Deep-fried vegetables. [place] fries. Any vegetables prepared with added fat. Any vegetables that cause symptoms. For some people, this may include tomatoes and tomato products, chili peppers, onions and garlic, and horseradish. Grains Pastries or quick breads with added fat. Meats and other proteins High-fat meats, such as fatty beef  or pork, hot dogs, ribs, ham, sausage, salami, and bacon. Fried meat or protein, including fried fish and fried chicken. Nuts and nut butters, in large amounts. Dairy Whole milk and chocolate milk. Sour cream. Cream. Ice cream. Cream cheese. Milkshakes. Fats and oils Butter. Margarine. Shortening. Ghee. Beverages Coffee and tea, with or without caffeine. Carbonated beverages. Sodas. Energy  drinks. Fruit juice made with acidic fruits, such as orange or grapefruit. Tomato juice. Alcoholic drinks. Sweets and desserts Chocolate and cocoa. Donuts. Seasonings and condiments Pepper. Peppermint and spearmint. Added salt. Any condiments, herbs, or seasonings that cause symptoms. For some people, this may include curry, hot sauce, or vinegar-based salad dressings. The items listed above may not be a complete list of what you should not eat and drink. Contact a food expert for more options. Questions to ask your doctor Diet and lifestyle changes are often the first steps that are taken to manage symptoms of GERD. If diet and lifestyle changes do not help, talk with your doctor about taking medicines. Where to find more information International Foundation for Gastrointestinal Disorders: aboutgerd.org Summary When you have GERD, food and lifestyle choices are very important in easing your symptoms. Eat small meals often instead of 3 large meals a day. Eat your meals slowly and in a place where you are relaxed. Avoid bending over or lying down until 2-3 hours after eating. Limit high-fat foods such as fatty meats or fried foods. This information is not intended to replace advice given to you by your health care provider. Make sure you discuss any questions you have with your health care provider. Document Revised: 03/04/2020 Document Reviewed: 03/04/2020 Elsevier Patient Education  [place].

## 2022-01-28 LAB — TSH: TSH: 1.1 u[IU]/mL (ref 0.450–4.500)

## 2022-01-28 LAB — COMPREHENSIVE METABOLIC PANEL
ALT: 7 IU/L (ref 0–32)
AST: 12 IU/L (ref 0–40)
Albumin/Globulin Ratio: 1.1 — ABNORMAL LOW (ref 1.2–2.2)
Albumin: 3.8 g/dL (ref 3.8–4.9)
Alkaline Phosphatase: 86 IU/L (ref 44–121)
BUN/Creatinine Ratio: 15 (ref 9–23)
BUN: 10 mg/dL (ref 6–24)
Bilirubin Total: 0.6 mg/dL (ref 0.0–1.2)
CO2: 24 mmol/L (ref 20–29)
Calcium: 9 mg/dL (ref 8.7–10.2)
Chloride: 106 mmol/L (ref 96–106)
Creatinine, Ser: 0.67 mg/dL (ref 0.57–1.00)
Globulin, Total: 3.6 g/dL (ref 1.5–4.5)
Glucose: 89 mg/dL (ref 70–99)
Potassium: 4.2 mmol/L (ref 3.5–5.2)
Sodium: 142 mmol/L (ref 134–144)
Total Protein: 7.4 g/dL (ref 6.0–8.5)
eGFR: 101 mL/min/{1.73_m2} (ref >59)

## 2022-01-28 LAB — CBC WITH DIFFERENTIAL/PLATELET
Basophils Absolute: 0.1 10*3/uL (ref 0.0–0.2)
Basos: 1 %
EOS (ABSOLUTE): 0.2 10*3/uL (ref 0.0–0.4)
Eos: 3 %
Hematocrit: 39.4 % (ref 34.0–46.6)
Hemoglobin: 13 g/dL (ref 11.1–15.9)
Immature Grans (Abs): 0 10*3/uL (ref 0.0–0.1)
Immature Granulocytes: 0 %
Lymphocytes Absolute: 2.1 10*3/uL (ref 0.7–3.1)
Lymphs: 35 %
MCH: 28.3 pg (ref 26.6–33.0)
MCHC: 33 g/dL (ref 31.5–35.7)
MCV: 86 fL (ref 79–97)
Monocytes Absolute: 0.5 10*3/uL (ref 0.1–0.9)
Monocytes: 8 %
Neutrophils Absolute: 3.2 10*3/uL (ref 1.4–7.0)
Neutrophils: 53 %
Platelets: 249 10*3/uL (ref 150–450)
RBC: 4.59 x10E6/uL (ref 3.77–5.28)
RDW: 13.4 % (ref 11.7–15.4)
WBC: 6.1 10*3/uL (ref 3.4–10.8)

## 2022-02-04 ENCOUNTER — Encounter: Payer: Medicare Other | Admitting: Obstetrics and Gynecology

## 2022-02-13 ENCOUNTER — Ambulatory Visit (INDEPENDENT_AMBULATORY_CARE_PROVIDER_SITE_OTHER): Payer: Medicare Other | Admitting: Orthopaedic Surgery

## 2022-02-13 DIAGNOSIS — M4310 Spondylolisthesis, site unspecified: Secondary | ICD-10-CM

## 2022-02-13 MED ORDER — MELOXICAM 15 MG PO TABS: 15.0000 mg | ORAL_TABLET | Freq: Every day | ORAL | 1 refills | Status: DC

## 2022-02-13 NOTE — Progress Notes (Signed)
Chief Complaint: Lower back pain     History of Present Illness:    Allison Dixon is a 61 y.o. female presents today with 4 years of ongoing chronic lower back pain.  She previously has had an MRI done in 2022.  She states that the pain does not go down the leg but does occasionally go down the posterior aspect of the thigh on both sides.  She has had epidural injections years ago which gave approximately 1 week of relief.  She has prolonged pain with sitting down.  This pain awakens her at night.  She is taking ibuprofen with minimal relief.  She is currently out of work.    Surgical History:   None  PMH/PSH/Family History/Social History/Meds/Allergies:    Past Medical History:  Diagnosis Date   Cancer ([place]) 2021   Uterine    COVID    Endometrial cancer ([place])    Hypertension    Morbid obesity ([place])    Sleep apnea    Past Surgical History:  Procedure Laterality Date   DILATION AND CURETTAGE, DIAGNOSTIC / THERAPEUTIC     EXPLORATORY LAPAROTOMY     for ectopic pregnancy   TOTAL LAPAROSCOPIC HYSTERECTOMY WITH BILATERAL SALPINGO OOPHORECTOMY Bilateral 05/22/2020   Procedure: TOTAL LAPAROSCOPIC HYSTERECTOMY WITH BILATERAL SALPINGO OOPHORECTOMY AND WASHINGS, PERINEAL BIOPSY;  Surgeon: Mellody Drown, MD;  Location: [facility] ORS;  Service: Gynecology;  Laterality: Bilateral;   Social History   Socioeconomic History   Marital status: Widowed    Spouse name: Not on file   Number of children: Not on file   Years of education: Not on file   Highest education level: Not on file  Occupational History   Not on file  Tobacco Use   Smoking status: Never   Smokeless tobacco: Never  Vaping Use   Vaping Use: Never used  Substance and Sexual Activity   Alcohol use: Never   Drug use: Never   Sexual activity: Not Currently  Other Topics Concern   Not on file  Social History Narrative   Not on file   Social Determinants of Health   Financial  Resource Strain: Low Risk  (05/31/2021)   Overall Financial Resource Strain (CARDIA)    Difficulty of Paying Living Expenses: Not hard at all  Food Insecurity: No Food Insecurity (05/31/2021)   Hunger Vital Sign    Worried About Running Out of Food in the Last Year: Never true    [place] in the Last Year: Never true  Transportation Needs: No Transportation Needs (05/31/2021)   PRAPARE - Hydrologist (Medical): No    Lack of Transportation (Non-Medical): No  Physical Activity: Insufficiently Active (05/31/2021)   Exercise Vital Sign    Days of Exercise per Week: 2 days    Minutes of Exercise per Session: 10 min  Stress: No Stress Concern Present (05/31/2021)   [place]    Feeling of Stress : Not at all  Social Connections: Socially Isolated (05/31/2021)   Social Connection and Isolation Panel [NHANES]    Frequency of Communication with Friends and Family: Once a week    Frequency of Social Gatherings with Friends and Family: Once a week    Attends Religious Services: 1 to 4 times per  year    Active Member of Clubs or Organizations: No    Attends Archivist Meetings: Never    Marital Status: Widowed   Family History  Problem Relation Age of Onset   Heart attack Mother    Cancer Mother    Cancer Father    No Known Allergies Current Outpatient Medications  Medication Sig Dispense Refill   meloxicam (MOBIC) 15 MG tablet Take 1 tablet (15 mg total) by mouth daily. 30 tablet 1   amLODipine (NORVASC) 5 MG tablet Take 1 tablet (5 mg total) by mouth daily. 180 tablet 3   fluticasone (FLONASE) 50 MCG/ACT nasal spray Place 2 sprays into both nostrils daily.     gabapentin (NEURONTIN) 300 MG capsule Take 1 capsule (300 mg total) by mouth at bedtime. 30 capsule 3   ibuprofen (ADVIL) 800 MG tablet Take 1 tablet (800 mg total) by mouth every 8 (eight) hours as needed. 60 tablet 1   No  current facility-administered medications for this visit.   No results found.  Review of Systems:   A ROS was performed including pertinent positives and negatives as documented in the HPI.  Physical Exam :   Constitutional: NAD and appears stated age Neurological: Alert and oriented Psych: Appropriate affect and cooperative There were no vitals taken for this visit.   Comprehensive Musculoskeletal Exam:    Tenderness palpation about lower back.  This is worse with forward flexion of the spine.  Negative straight leg raise bilaterally.  Painless range of motion about bilateral hips.  Imaging:     MRI (lumbar spine): There is a spondylolisthesis of L4 on L5 as well as L5 on S1.  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  I personally reviewed and interpreted the radiographs.   Assessment:   61 y.o. 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  At this time she has trialed multiple forms of nonoperative management including physical therapy and epidural injections.  Given the fact that she does have a spondylolisthesis I would like to place a neurosurgical referral to see if she be a candidate for fixation of this.  If not we did discuss that we could also potentially refer her for pain management assessment.  Plan :    -Plan for neurosurgical referral to see if she is a candidate for repair of her spinal listhesis     I personally saw and evaluated the patient, and participated in the management and treatment plan.  Vanetta Mulders, MD Attending Physician, Orthopedic Surgery  This document was dictated using Dragon voice recognition software. A reasonable attempt at proof reading has been made to minimize errors.

## 2022-02-19 ENCOUNTER — Ambulatory Visit (INDEPENDENT_AMBULATORY_CARE_PROVIDER_SITE_OTHER): Payer: Medicare Other | Admitting: Obstetrics and Gynecology

## 2022-02-19 ENCOUNTER — Encounter: Payer: Self-pay | Admitting: Obstetrics and Gynecology

## 2022-02-19 VITALS — BP 142/89 | HR 75 | Ht 62.0 in | Wt 246.0 lb

## 2022-02-19 DIAGNOSIS — Z7689 Persons encountering health services in other specified circumstances: Secondary | ICD-10-CM

## 2022-02-19 DIAGNOSIS — R309 Painful micturition, unspecified: Secondary | ICD-10-CM

## 2022-02-19 DIAGNOSIS — N8111 Cystocele, midline: Secondary | ICD-10-CM

## 2022-02-19 DIAGNOSIS — R399 Unspecified symptoms and signs involving the genitourinary system: Secondary | ICD-10-CM | POA: Diagnosis not present

## 2022-02-19 LAB — POCT URINALYSIS DIPSTICK
Blood, UA: NEGATIVE
Glucose, UA: NEGATIVE
Leukocytes, UA: NEGATIVE
Nitrite, UA: NEGATIVE
Protein, UA: NEGATIVE
Spec Grav, UA: 1.025 (ref 1.010–1.025)
Urobilinogen, UA: 1 E.U./dL
pH, UA: 5 (ref 5.0–8.0)

## 2022-02-19 NOTE — Progress Notes (Signed)
HPI:      [name] is a 61 y.o. G7P0000 who LMP was No LMP recorded (lmp unknown). Patient has had a hysterectomy.  Subjective:   She presents today stating that over the last 3 months she has had some occasional burning with urination up inside the vagina.  It is not every time.  She has been tested for a UTI in May and it was found to be negative.  She also complains of occasional low back pain.  She reports no problems emptying her bladder.  She does not leak urine. Of significant note, patient has had a previous hysterectomy. She denies any vaginal discharge or other symptoms of vaginitis-patient is not sexually active.    Hx: The following portions of the patient's history were reviewed and updated as appropriate:             She  has a past medical history of Cancer ([place]) (2021), COVID, Endometrial cancer ([place]), Hypertension, Morbid obesity ([place]), and Sleep apnea. She does not have any pertinent problems on file. She  has a past surgical history that includes Dilation and curettage, diagnostic / therapeutic; Exploratory laparotomy; Total laparoscopic hysterectomy with bilateral salpingo oophorectomy (Bilateral, 05/22/2020); and Foot surgery (Left). Her family history includes Cancer in her father and mother; Heart attack in her mother. She  reports that she has never smoked. She has never used smokeless tobacco. She reports that she does not drink alcohol and does not use drugs. She has a current medication list which includes the following prescription(s): fluticasone, gabapentin, ibuprofen, meloxicam, and amlodipine. She has No Known Allergies.       Review of Systems:  Review of Systems  Constitutional: Denied constitutional symptoms, night sweats, recent illness, fatigue, fever, insomnia and weight loss.  Eyes: Denied eye symptoms, eye pain, photophobia, vision change and visual disturbance.  Ears/Nose/Throat/Neck: Denied ear, nose, throat or neck symptoms, hearing loss,  nasal discharge, sinus congestion and sore throat.  Cardiovascular: Denied cardiovascular symptoms, arrhythmia, chest pain/pressure, edema, exercise intolerance, orthopnea and palpitations.  Respiratory: Denied pulmonary symptoms, asthma, pleuritic pain, productive sputum, cough, dyspnea and wheezing.  Gastrointestinal: Denied, gastro-esophageal reflux, melena, nausea and vomiting.  Genitourinary: See HPI for additional information.  Musculoskeletal: Denied musculoskeletal symptoms, stiffness, swelling, muscle weakness and myalgia.  Dermatologic: Denied dermatology symptoms, rash and scar.  Neurologic: Denied neurology symptoms, dizziness, headache, neck pain and syncope.  Psychiatric: Denied psychiatric symptoms, anxiety and depression.  Endocrine: Denied endocrine symptoms including hot flashes and night sweats.   Meds:   Current Outpatient Medications on File Prior to Visit  Medication Sig Dispense Refill   fluticasone (FLONASE) 50 MCG/ACT nasal spray Place 2 sprays into both nostrils daily.     gabapentin (NEURONTIN) 300 MG capsule Take 1 capsule (300 mg total) by mouth at bedtime. 30 capsule 3   ibuprofen (ADVIL) 800 MG tablet Take 1 tablet (800 mg total) by mouth every 8 (eight) hours as needed. 60 tablet 1   meloxicam (MOBIC) 15 MG tablet Take 1 tablet (15 mg total) by mouth daily. 30 tablet 1   amLODipine (NORVASC) 5 MG tablet Take 1 tablet (5 mg total) by mouth daily. 180 tablet 3   No current facility-administered medications on file prior to visit.      Objective:     Vitals:   02/19/22 1139  BP: (!) 142/89  Pulse: 75   Filed Weights   02/19/22 1139  Weight: 246 lb (111.6 kg)  Physical examination   Pelvic:   Vulva: Normal appearance.  No lesions.  Vagina: No lesions or abnormalities noted.  Support: Second-degree cystocele  Urethra No masses tenderness or scarring.  Meatus Normal size without lesions or prolapse.  Cervix: Surgically absent  Anus:  Normal exam.  No lesions.  Perineum: Normal exam.  No lesions.        Bimanual   Uterus: Surgically absent  Adnexae: No masses.  Non-tender to palpation.  Cul-de-sac: Negative for abnormality.             Assessment:    G7P0000 Patient Active Problem List   Diagnosis Date Noted   SOB (shortness of breath) 01/27/2022   Cardiac murmur 01/27/2022   Frequency of urination 10/14/2021   Urination pain 10/14/2021   Hematuria 10/14/2021   Vaginal spotting 10/14/2021   History of endometrial cancer 07/29/2021   Primary hypertension 01/14/2021   Prediabetes 01/14/2021   Chronic bilateral low back pain with right-sided sciatica 01/14/2021   Left foot pain 01/14/2021   Morbid obesity ([place]) 01/14/2021   S/P laparoscopic hysterectomy 05/30/2020   Moderate obstructive sleep apnea 05/07/2020   Shortness of breath 05/07/2020   Abnormal uterine bleeding 04/18/2020     1. Painful urination   2. Urinary symptom or sign   3. Cystocele, midline     I can find no specific issue for her pain with urination.  She does have a second-degree cystocele but does not leak urine.  She may have some back pain from the cystocele.  No evidence of urethral diverticulum  UA dip today-negative for UTI.   Plan:            1.  We have discussed cystocele in detail.  Patient's symptoms she says are "not that bad".  We discussed further work-up with urology and patient does not desire this at this time.  She will contact us if her symptoms worsen or if she notices something bulging at or out of the vagina.  We will consider anterior repair at that point. All questions answered. Orders Orders Placed This Encounter  Procedures   Urine Culture   POCT urinalysis dipstick    No orders of the defined types were placed in this encounter.     F/U  Return for Pt to contact us if symptoms worsen. I spent 23 minutes involved in the care of this patient preparing to see the patient by obtaining and reviewing her  medical history (including labs, imaging tests and prior procedures), documenting clinical information in the electronic health record (EHR), counseling and coordinating care plans, writing and sending prescriptions, ordering tests or procedures and in direct communicating with the patient and medical staff discussing pertinent items from her history and physical exam.  Finis Bud, M.D. 02/19/2022 12:03 PM

## 2022-02-19 NOTE — Progress Notes (Signed)
Patient presents due to painful urination. She states her symptoms of burning and low back pain began 3-4 months ago. Patient states no other concerns at this time.

## 2022-02-20 ENCOUNTER — Encounter: Payer: Self-pay | Admitting: Cardiology

## 2022-02-20 ENCOUNTER — Ambulatory Visit (INDEPENDENT_AMBULATORY_CARE_PROVIDER_SITE_OTHER): Payer: Medicare Other | Admitting: Cardiology

## 2022-02-20 VITALS — BP 132/76 | HR 78 | Ht 62.0 in | Wt 246.2 lb

## 2022-02-20 DIAGNOSIS — I10 Essential (primary) hypertension: Secondary | ICD-10-CM | POA: Diagnosis not present

## 2022-02-20 NOTE — Progress Notes (Signed)
Cardiology Office Note:    Date:  02/20/2022   ID:  Allison Dixon, DOB 1961-02-01, MRN [id number]  PCP:  Charlynne Cousins, MD  Cardiologist:  Kate Sable, MD  Electrophysiologist:  None   Referring MD: Charlynne Cousins, MD   No chief complaint on file.   History of Present Illness:    Allison Dixon is a 61 y.o. female with a hx of obesity, hypertension, OSA/CPAP nightly who presents for follow-up.    Patient being seen for shortness of breath, previous cardiac work-up with echo and Myoview was unrevealing.  EF was normal, no ischemia noted on Myoview.  Etiology was shortness of breath was secondary to morbid obesity.  Blood pressure is adequately controlled on current dose of amlodipine.  Has no new concerns at this time.  Prior notes Lexiscan Myoview 01/2021, no evidence for ischemia, low risk scan.  EF 55% Echo 02/2021, EF 55%, normal systolic and diastolic function.  Past Medical History:  Diagnosis Date   Cancer ([place]) 2021   Uterine    COVID    Endometrial cancer ([place])    Hypertension    Morbid obesity ([place])    Sleep apnea     Past Surgical History:  Procedure Laterality Date   DILATION AND CURETTAGE, DIAGNOSTIC / THERAPEUTIC     EXPLORATORY LAPAROTOMY     for ectopic pregnancy   FOOT SURGERY Left    TOTAL LAPAROSCOPIC HYSTERECTOMY WITH BILATERAL SALPINGO OOPHORECTOMY Bilateral 05/22/2020   Procedure: TOTAL LAPAROSCOPIC HYSTERECTOMY WITH BILATERAL SALPINGO OOPHORECTOMY AND WASHINGS, PERINEAL BIOPSY;  Surgeon: Mellody Drown, MD;  Location: [facility] ORS;  Service: Gynecology;  Laterality: Bilateral;    Current Medications: Current Meds  Medication Sig   fluticasone (FLONASE) 50 MCG/ACT nasal spray Place 2 sprays into both nostrils daily.   gabapentin (NEURONTIN) 300 MG capsule Take 1 capsule (300 mg total) by mouth at bedtime.   ibuprofen (ADVIL) 800 MG tablet Take 1 tablet (800 mg total) by mouth every 8 (eight) hours as needed.   meloxicam (MOBIC) 15 MG tablet Take 1  tablet (15 mg total) by mouth daily.     Allergies:   Patient has no known allergies.   Social History   Socioeconomic History   Marital status: Widowed    Spouse name: Not on file   Number of children: Not on file   Years of education: Not on file   Highest education level: Not on file  Occupational History   Not on file  Tobacco Use   Smoking status: Never   Smokeless tobacco: Never  Vaping Use   Vaping Use: Never used  Substance and Sexual Activity   Alcohol use: Never   Drug use: Never   Sexual activity: Not Currently    Birth control/protection: Surgical  Other Topics Concern   Not on file  Social History Narrative   Not on file   Social Determinants of Health   Financial Resource Strain: Low Risk  (05/31/2021)   Overall Financial Resource Strain (CARDIA)    Difficulty of Paying Living Expenses: Not hard at all  Food Insecurity: No Food Insecurity (05/31/2021)   Hunger Vital Sign    Worried About Running Out of Food in the Last Year: Never true    [place] in the Last Year: Never true  Transportation Needs: No Transportation Needs (05/31/2021)   PRAPARE - Hydrologist (Medical): No    Lack of Transportation (Non-Medical): No  Physical Activity: Insufficiently Active (05/31/2021)  Exercise Vital Sign    Days of Exercise per Week: 2 days    Minutes of Exercise per Session: 10 min  Stress: No Stress Concern Present (05/31/2021)   [place]    Feeling of Stress : Not at all  Social Connections: Socially Isolated (05/31/2021)   Social Connection and Isolation Panel [NHANES]    Frequency of Communication with Friends and Family: Once a week    Frequency of Social Gatherings with Friends and Family: Once a week    Attends Religious Services: 1 to 4 times per year    Active Member of [employer] or Organizations: No    Attends Archivist Meetings: Never    Marital  Status: Widowed     Family History: The patient's family history includes Cancer in her father and mother; Heart attack in her mother.  ROS:   Please see the history of present illness.     All other systems reviewed and are negative.  EKGs/Labs/Other Studies Reviewed:    The following studies were reviewed today:   EKG:  EKG is  ordered today.  The ekg ordered today demonstrates normal sinus rhythm, left bundle branch block..  Recent Labs: 01/27/2022: ALT 7; BUN 10; Creatinine, Ser 0.67; Hemoglobin 13.0; Platelets 249; Potassium 4.2; Sodium 142; TSH 1.100  Recent Lipid Panel    Component Value Date/Time   CHOL 179 07/29/2021 0902   TRIG 78 07/29/2021 0902   HDL 64 07/29/2021 0902   LDLCALC 101 (H) 07/29/2021 0902    Physical Exam:    VS:  BP 132/76   Pulse 78   Ht '5\' 2"'$  (1.575 m)   Wt 246 lb 3.2 oz (111.7 kg)   LMP  (LMP Unknown)   SpO2 96%   BMI 45.03 kg/m     Wt Readings from Last 3 Encounters:  02/20/22 246 lb 3.2 oz (111.7 kg)  02/19/22 246 lb (111.6 kg)  01/27/22 245 lb (111.1 kg)     GEN:  Well nourished, well developed in no acute distress, obese HEENT: Normal NECK: No JVD; No carotid bruits LYMPHATICS: No lymphadenopathy CARDIAC: RRR, no murmurs, rubs, gallops RESPIRATORY:  Clear to auscultation without rales, wheezing or rhonchi  ABDOMEN: Soft, non-tender, distended MUSCULOSKELETAL:  No edema; No deformity  SKIN: Warm and dry NEUROLOGIC:  Alert and oriented x 3 PSYCHIATRIC:  Normal affect   ASSESSMENT:    1. Essential hypertension   2. Morbid obesity ([place])    PLAN:    In order of problems listed above:   Hypertension, blood pressure controlled, continue amlodipine 5 mg daily. Morbid obesity, low-calorie diet, weight loss again stressed.  Complications of morbid obesity including arthritis, cardiovascular effects discussed with patient.   Follow-up.   This note was generated in part or whole with voice recognition software. Voice  recognition is usually quite accurate but there are transcription errors that can and very often do occur. I apologize for any typographical errors that were not detected and corrected.  Medication Adjustments/Labs and Tests Ordered: Current medicines are reviewed at length with the patient today.  Concerns regarding medicines are outlined above.  Orders Placed This Encounter  Procedures   EKG 12-Lead    No orders of the defined types were placed in this encounter.    Patient Instructions  Medication Instructions:  Your physician recommends that you continue on your current medications as directed. Please refer to the Current Medication list given to you today.  *If  you need a refill on your cardiac medications before your next appointment, please call your pharmacy*    Follow-Up: At [facility], you and your health needs are our priority.  As part of our continuing mission to provide you with exceptional heart care, we have created designated Provider Care Teams.  These Care Teams include your primary Cardiologist (physician) and Advanced Practice Providers (APPs -  Physician Assistants and Nurse Practitioners) who all work together to provide you with the care you need, when you need it.  We recommend signing up for the patient portal called "MyChart".  Sign up information is provided on this After Visit Summary.  MyChart is used to connect with patients for Virtual Visits (Telemedicine).  Patients are able to view lab/test results, encounter notes, upcoming appointments, etc.  Non-urgent messages can be sent to your provider as well.   To learn more about what you can do with MyChart, go to [email or web address].    Your next appointment:   Follow up as needed   The format for your next appointment:   In Person  Provider:   You may see Kate Sable, MD or one of the following Advanced Practice Providers on your designated Care Team:   Murray Hodgkins, NP Christell Faith,  PA-C Cadence Kathlen Mody, [place]      Important Information About Sugar         Signed, Kate Sable, MD  02/20/2022 10:50 AM    [place]

## 2022-02-20 NOTE — Patient Instructions (Signed)
Medication Instructions:  Your physician recommends that you continue on your current medications as directed. Please refer to the Current Medication list given to you today.  *If you need a refill on your cardiac medications before your next appointment, please call your pharmacy*    Follow-Up: At [facility], you and your health needs are our priority.  As part of our continuing mission to provide you with exceptional heart care, we have created designated Provider Care Teams.  These Care Teams include your primary Cardiologist (physician) and Advanced Practice Providers (APPs -  Physician Assistants and Nurse Practitioners) who all work together to provide you with the care you need, when you need it.  We recommend signing up for the patient portal called "MyChart".  Sign up information is provided on this After Visit Summary.  MyChart is used to connect with patients for Virtual Visits (Telemedicine).  Patients are able to view lab/test results, encounter notes, upcoming appointments, etc.  Non-urgent messages can be sent to your provider as well.   To learn more about what you can do with MyChart, go to [email or web address].    Your next appointment:   Follow up as needed   The format for your next appointment:   In Person  Provider:   You may see Kate Sable, MD or one of the following Advanced Practice Providers on your designated Care Team:   Murray Hodgkins, NP Christell Faith, PA-C Cadence Kathlen Mody, [place]      Important Information About Sugar

## 2022-02-21 LAB — URINE CULTURE

## 2022-02-24 ENCOUNTER — Ambulatory Visit: Payer: Medicare Other | Admitting: Anesthesiology

## 2022-02-24 ENCOUNTER — Ambulatory Visit
Admission: RE | Admit: 2022-02-24 | Discharge: 2022-02-24 | Disposition: A | Discharge status: Home / Self Care | Payer: Medicare Other | Attending: Gastroenterology | Admitting: Gastroenterology

## 2022-02-24 ENCOUNTER — Other Ambulatory Visit: Payer: Self-pay

## 2022-02-24 ENCOUNTER — Encounter
Admission: RE | Disposition: A | Discharge status: Home / Self Care | Payer: Self-pay | Source: Home / Self Care | Attending: Gastroenterology

## 2022-02-24 ENCOUNTER — Telehealth: Payer: Self-pay

## 2022-02-24 ENCOUNTER — Encounter: Payer: Self-pay | Admitting: Gastroenterology

## 2022-02-24 DIAGNOSIS — Z79899 Other long term (current) drug therapy: Secondary | ICD-10-CM | POA: Insufficient documentation

## 2022-02-24 DIAGNOSIS — R1310 Dysphagia, unspecified: Secondary | ICD-10-CM | POA: Diagnosis present

## 2022-02-24 DIAGNOSIS — G4730 Sleep apnea, unspecified: Secondary | ICD-10-CM | POA: Insufficient documentation

## 2022-02-24 DIAGNOSIS — Z8616 Personal history of COVID-19: Secondary | ICD-10-CM | POA: Insufficient documentation

## 2022-02-24 DIAGNOSIS — Z6841 Body Mass Index (BMI) 40.0 and over, adult: Secondary | ICD-10-CM | POA: Diagnosis not present

## 2022-02-24 DIAGNOSIS — I10 Essential (primary) hypertension: Secondary | ICD-10-CM | POA: Insufficient documentation

## 2022-02-24 DIAGNOSIS — Z8542 Personal history of malignant neoplasm of other parts of uterus: Secondary | ICD-10-CM | POA: Diagnosis not present

## 2022-02-24 HISTORY — PX: ESOPHAGOGASTRODUODENOSCOPY (EGD) WITH PROPOFOL: SHX5813

## 2022-02-24 SURGERY — ESOPHAGOGASTRODUODENOSCOPY (EGD) WITH PROPOFOL
Anesthesia: General

## 2022-02-24 MED ORDER — PROPOFOL 10 MG/ML IV BOLUS
INTRAVENOUS | Status: DC | PRN
  Administered 2022-02-24: 165 ug/kg/min via INTRAVENOUS

## 2022-02-24 MED ORDER — SODIUM CHLORIDE 0.9 % IV SOLN: INTRAVENOUS | Status: DC

## 2022-02-24 MED ORDER — PROPOFOL 10 MG/ML IV BOLUS
INTRAVENOUS | Status: AC
  Filled 2022-02-24: qty 40

## 2022-02-24 NOTE — Op Note (Signed)
[facility] Gastroenterology Patient Name: Allison Dixon Procedure Date: 02/24/2022 11:11 AM MRN: [id number] Account #: [id number] Date of Birth: 1960/09/24 Admit Type: Outpatient Age: 61 Room: [facility] ENDO ROOM 4 Gender: Female Note Status: Finalized Instrument Name: Upper Endoscope [id number] Procedure:             Upper GI endoscopy Indications:           Dysphagia Providers:             Lucilla Lame MD, MD Referring MD:          Charlynne Cousins (Referring MD) Medicines:             Propofol per Anesthesia Complications:         No immediate complications. Procedure:             Pre-Anesthesia Assessment:                        - Prior to the procedure, a History and Physical was                         performed, and patient medications and allergies were                         reviewed. The patient's tolerance of previous                         anesthesia was also reviewed. The risks and benefits                         of the procedure and the sedation options and risks                         were discussed with the patient. All questions were                         answered, and informed consent was obtained. Prior                         Anticoagulants: The patient has taken no previous                         anticoagulant or antiplatelet agents. ASA Grade                         Assessment: II - A patient with mild systemic disease.                         After reviewing the risks and benefits, the patient                         was deemed in satisfactory condition to undergo the                         procedure.                        After obtaining informed consent, the endoscope was  passed under direct vision. Throughout the procedure,                         the patient's blood pressure, pulse, and oxygen                         saturations were monitored continuously. The Endoscope                         was introduced through  the mouth, and advanced to the                         second part of duodenum. The upper GI endoscopy was                         accomplished without difficulty. The patient tolerated                         the procedure well. Findings:      The examined esophagus was normal. A TTS dilator was passed through the       scope. Dilation with a 15-16.5-18 mm balloon dilator was performed to 18       mm. The dilation site was examined following endoscope reinsertion and       showed complete resolution of luminal narrowing. Two biopsies were       obtained in the middle third of the esophagus with cold forceps for       histology.      A large amount of food (residue) was found in the entire examined       stomach.      The examined duodenum was normal. Impression:            - Normal esophagus. Dilated.                        - A large amount of food (residue) in the stomach.                        - Normal examined duodenum.                        - Two biopsies were obtained in the middle third of                         the esophagus. Recommendation:        - Discharge patient to home.                        - Resume previous diet.                        - Continue present medications.                        - Await pathology results.                        - Do a gastric emptying study. Procedure Code(s):     --- Professional ---  [phone], Esophagogastroduodenoscopy, flexible,                         transoral; with transendoscopic balloon dilation of                         esophagus (less than 30 mm diameter)                        43239, 59, Esophagogastroduodenoscopy, flexible,                         transoral; with biopsy, single or multiple Diagnosis Code(s):     --- Professional ---                        R13.10, Dysphagia, unspecified CPT copyright 2019 American Medical Association. All rights reserved. The codes documented in this report are  preliminary and upon coder review may  be revised to meet current compliance requirements. Lucilla Lame MD, MD 02/24/2022 11:35:10 AM This report has been signed electronically. Number of Addenda: 0 Note Initiated On: 02/24/2022 11:11 AM Estimated Blood Loss:  Estimated blood loss: none.      [facility]

## 2022-02-24 NOTE — H&P (Signed)
Lucilla Lame, MD [address] [place], [place] [address] Phone:[phone] Fax : [phone]  Primary Care Physician:  Charlynne Cousins, MD Primary Gastroenterologist:  Dr. Allen Norris  Pre-Procedure History & Physical: HPI:  Allison Dixon is a 61 y.o. female is here for an endoscopy.   Past Medical History:  Diagnosis Date   Cancer ([place]) 2021   Uterine    COVID    Endometrial cancer ([place])    Hypertension    Morbid obesity ([place])    Sleep apnea     Past Surgical History:  Procedure Laterality Date   DILATION AND CURETTAGE, DIAGNOSTIC / THERAPEUTIC     EXPLORATORY LAPAROTOMY     for ectopic pregnancy   FOOT SURGERY Left    TOTAL LAPAROSCOPIC HYSTERECTOMY WITH BILATERAL SALPINGO OOPHORECTOMY Bilateral 05/22/2020   Procedure: TOTAL LAPAROSCOPIC HYSTERECTOMY WITH BILATERAL SALPINGO OOPHORECTOMY AND WASHINGS, PERINEAL BIOPSY;  Surgeon: Mellody Drown, MD;  Location: [facility] ORS;  Service: Gynecology;  Laterality: Bilateral;    Prior to Admission medications   Medication Sig Start Date End Date Taking? Authorizing Provider  gabapentin (NEURONTIN) 300 MG capsule Take 1 capsule (300 mg total) by mouth at bedtime. 01/27/22  Yes Vigg, Avanti, MD  ibuprofen (ADVIL) 800 MG tablet Take 1 tablet (800 mg total) by mouth every 8 (eight) hours as needed. 07/29/21  Yes McElwee, Lauren A, NP  meloxicam (MOBIC) 15 MG tablet Take 1 tablet (15 mg total) by mouth daily. 02/13/22  Yes Vanetta Mulders, MD  amLODipine (NORVASC) 5 MG tablet Take 1 tablet (5 mg total) by mouth daily. 01/17/21 07/29/21  Rise Mu, PA-C  fluticasone (FLONASE) 50 MCG/ACT nasal spray Place 2 sprays into both nostrils daily. 05/23/21   [provider]    Allergies as of 01/23/2022   (No Known Allergies)    Family History  Problem Relation Age of Onset   Heart attack Mother    Cancer Mother    Cancer Father     Social History   Socioeconomic History   Marital status: Widowed    Spouse name: Not on  file   Number of children: Not on file   Years of education: Not on file   Highest education level: Not on file  Occupational History   Not on file  Tobacco Use   Smoking status: Never   Smokeless tobacco: Never  Vaping Use   Vaping Use: Never used  Substance and Sexual Activity   Alcohol use: Never   Drug use: Never   Sexual activity: Not Currently    Birth control/protection: Surgical  Other Topics Concern   Not on file  Social History Narrative   Not on file   Social Determinants of Health   Financial Resource Strain: Low Risk  (05/31/2021)   Overall Financial Resource Strain (CARDIA)    Difficulty of Paying Living Expenses: Not hard at all  Food Insecurity: No Food Insecurity (05/31/2021)   Hunger Vital Sign    Worried About Running Out of Food in the Last Year: Never true    [place] in the Last Year: Never true  Transportation Needs: No Transportation Needs (05/31/2021)   PRAPARE - Hydrologist (Medical): No    Lack of Transportation (Non-Medical): No  Physical Activity: Insufficiently Active (05/31/2021)   Exercise Vital Sign    Days of Exercise per Week: 2 days    Minutes of Exercise per Session: 10 min  Stress: No Stress Concern Present (05/31/2021)  [place] Questionnaire    Feeling of Stress : Not at all  Social Connections: Socially Isolated (05/31/2021)   Social Connection and Isolation Panel [NHANES]    Frequency of Communication with Friends and Family: Once a week    Frequency of Social Gatherings with Friends and Family: Once a week    Attends Religious Services: 1 to 4 times per year    Active Member of [employer] or Organizations: No    Attends Archivist Meetings: Never    Marital Status: Widowed  Intimate Partner Violence: Not At Risk (05/31/2021)   Humiliation, Afraid, Rape, and Kick questionnaire    Fear of Current or Ex-Partner: No    Emotionally Abused:  No    Physically Abused: No    Sexually Abused: No    Review of Systems: See HPI, otherwise negative ROS  Physical Exam: BP (!) 148/86   Pulse 67   Temp (!) 97.4 F (36.3 C) (Temporal)   Resp 16   Ht '5\' 2"'$  (1.575 m)   Wt 111.6 kg   LMP  (LMP Unknown)   SpO2 99%   BMI 44.99 kg/m  General:   Alert,  pleasant and cooperative in NAD Head:  Normocephalic and atraumatic. Neck:  Supple; no masses or thyromegaly. Lungs:  Clear throughout to auscultation.    Heart:  Regular rate and rhythm. Abdomen:  Soft, nontender and nondistended. Normal bowel sounds, without guarding, and without rebound.   Neurologic:  Alert and  oriented x4;  grossly normal neurologically.  Impression/Plan: Allison Dixon is here for an endoscopy to be performed for dysphagia  Risks, benefits, limitations, and alternatives regarding  endoscopy have been reviewed with the patient.  Questions have been answered.  All parties agreeable.   Lucilla Lame, MD  02/24/2022, 11:31 AM

## 2022-02-24 NOTE — Telephone Encounter (Signed)
Patient needs a gastric emptying study for dysphagia and retained food

## 2022-02-24 NOTE — Anesthesia Postprocedure Evaluation (Signed)
Anesthesia Post Note  Patient: Allison Dixon  Procedure(s) Performed: ESOPHAGOGASTRODUODENOSCOPY (EGD) WITH PROPOFOL  Patient location during evaluation: PACU Anesthesia Type: General Level of consciousness: awake and oriented Pain management: satisfactory to patient Vital Signs Assessment: post-procedure vital signs reviewed and stable Respiratory status: nonlabored ventilation Cardiovascular status: stable Anesthetic complications: no   No notable events documented.   Last Vitals:  Vitals:   02/24/22 1138 02/24/22 1144  BP: 119/71 126/82  Pulse: 80   Resp: 20   Temp:    SpO2:      Last Pain:  Vitals:   02/24/22 1144  TempSrc:   PainSc: 0-No pain                 VAN STAVEREN,Tip Atienza

## 2022-02-24 NOTE — Anesthesia Preprocedure Evaluation (Signed)
Anesthesia Evaluation  Patient identified by MRN, date of birth, ID band Patient awake    Reviewed: Allergy & Precautions, NPO status , Patient's Chart, lab work & pertinent test results  Airway Mallampati: II  TM Distance: >3 FB Neck ROM: Limited    Dental  (+) Missing   Pulmonary neg pulmonary ROS, shortness of breath and with exertion, sleep apnea ,    Pulmonary exam normal  + decreased breath sounds      Cardiovascular Exercise Tolerance: Good hypertension, Pt. on medications + DOE  negative cardio ROS Normal cardiovascular exam Rhythm:Regular     Neuro/Psych negative neurological ROS  negative psych ROS   GI/Hepatic negative GI ROS, Neg liver ROS,   Endo/Other  negative endocrine ROSMorbid obesity  Renal/GU negative Renal ROS  negative genitourinary   Musculoskeletal   Abdominal (+) + obese,   Peds negative pediatric ROS (+)  Hematology negative hematology ROS (+)   Anesthesia Other Findings Past Medical History: 2021: Cancer ([place])     Comment:  Uterine  No date: COVID No date: Endometrial cancer ([place]) No date: Hypertension No date: Morbid obesity ([place]) No date: Sleep apnea  Past Surgical History: No date: DILATION AND CURETTAGE, DIAGNOSTIC / THERAPEUTIC No date: EXPLORATORY LAPAROTOMY     Comment:  for ectopic pregnancy No date: FOOT SURGERY; Left 05/22/2020: TOTAL LAPAROSCOPIC HYSTERECTOMY WITH BILATERAL SALPINGO  OOPHORECTOMY; Bilateral     Comment:  Procedure: TOTAL LAPAROSCOPIC HYSTERECTOMY WITH               BILATERAL SALPINGO OOPHORECTOMY AND WASHINGS, PERINEAL               BIOPSY;  Surgeon: Mellody Drown, MD;  Location: [facility]               ORS;  Service: Gynecology;  Laterality: Bilateral;  BMI    Body Mass Index: 44.99 kg/m      Reproductive/Obstetrics negative OB ROS                             Anesthesia Physical Anesthesia Plan  ASA: 3  Anesthesia  Plan: General   Post-op Pain Management:    Induction: Intravenous  PONV Risk Score and Plan: Propofol infusion and TIVA  Airway Management Planned: Natural Airway  Additional Equipment:   Intra-op Plan:   Post-operative Plan:   Informed Consent: I have reviewed the patients History and Physical, chart, labs and discussed the procedure including the risks, benefits and alternatives for the proposed anesthesia with the patient or authorized representative who has indicated his/her understanding and acceptance.     Dental Advisory Given  Plan Discussed with: CRNA and Surgeon  Anesthesia Plan Comments:         Anesthesia Quick Evaluation

## 2022-02-24 NOTE — Transfer of Care (Signed)
Immediate Anesthesia Transfer of Care Note  Patient: Allison Dixon  Procedure(s) Performed: ESOPHAGOGASTRODUODENOSCOPY (EGD) WITH PROPOFOL  Patient Location: PACU  Anesthesia Type:General  Level of Consciousness: awake and alert   Airway & Oxygen Therapy: Patient Spontanous Breathing and Patient connected to nasal cannula oxygen  Post-op Assessment: Report given to RN and Post -op Vital signs reviewed and stable  Post vital signs: Reviewed and stable  Last Vitals:  Vitals Value Taken Time  BP    Temp    Pulse 80 02/24/22 1132  Resp 23 02/24/22 1132  SpO2 100 % 02/24/22 1132    Last Pain:  Vitals:   02/24/22 1132  TempSrc: Temporal  PainSc: Asleep         Complications: No notable events documented.

## 2022-02-25 ENCOUNTER — Encounter: Payer: Self-pay | Admitting: Gastroenterology

## 2022-02-25 LAB — SURGICAL PATHOLOGY

## 2022-02-26 ENCOUNTER — Other Ambulatory Visit: Payer: Self-pay

## 2022-02-26 DIAGNOSIS — R1310 Dysphagia, unspecified: Secondary | ICD-10-CM

## 2022-02-26 DIAGNOSIS — K3189 Other diseases of stomach and duodenum: Secondary | ICD-10-CM

## 2022-03-01 ENCOUNTER — Encounter: Payer: Self-pay | Admitting: Gastroenterology

## 2022-03-20 ENCOUNTER — Ambulatory Visit
Admission: RE | Admit: 2022-03-20 | Discharge: 2022-03-20 | Disposition: A | Discharge status: Home / Self Care | Payer: Medicare Other | Source: Ambulatory Visit | Attending: Gastroenterology | Admitting: Gastroenterology

## 2022-03-20 DIAGNOSIS — K3189 Other diseases of stomach and duodenum: Secondary | ICD-10-CM

## 2022-03-20 DIAGNOSIS — R1310 Dysphagia, unspecified: Secondary | ICD-10-CM | POA: Diagnosis present

## 2022-03-20 MED ORDER — TECHNETIUM TC 99M SULFUR COLLOID
2.2400 | Freq: Once | INTRAVENOUS | Status: AC | PRN
  Administered 2022-03-20: 2.24 via ORAL

## 2022-06-02 ENCOUNTER — Telehealth: Payer: Self-pay | Admitting: Internal Medicine

## 2022-06-02 NOTE — Telephone Encounter (Signed)
Copied from [place] [id number]. Topic: Medicare AWV >> Jun 02, 2022 12:37 PM Josephina Gip wrote: Reason for CRM: Left message for patient to call back and schedule the Medicare Annual Wellness Visit (AWV) virtually or by telephone.  Last AWV 05/31/21  Please schedule at anytime with [facility]-Nurse Health Advisor.   Any questions, please call me at [phone]

## 2022-06-24 ENCOUNTER — Inpatient Hospital Stay: Payer: Medicare Other | Attending: Obstetrics and Gynecology | Admitting: Obstetrics and Gynecology

## 2022-06-24 VITALS — BP 137/72 | HR 74 | Temp 98.7°F | Resp 18 | Wt 245.5 lb

## 2022-06-24 DIAGNOSIS — Z6841 Body Mass Index (BMI) 40.0 and over, adult: Secondary | ICD-10-CM | POA: Diagnosis not present

## 2022-06-24 DIAGNOSIS — Z90722 Acquired absence of ovaries, bilateral: Secondary | ICD-10-CM | POA: Insufficient documentation

## 2022-06-24 DIAGNOSIS — Z08 Encounter for follow-up examination after completed treatment for malignant neoplasm: Secondary | ICD-10-CM | POA: Diagnosis present

## 2022-06-24 DIAGNOSIS — Z8542 Personal history of malignant neoplasm of other parts of uterus: Secondary | ICD-10-CM | POA: Insufficient documentation

## 2022-06-24 DIAGNOSIS — Z90710 Acquired absence of both cervix and uterus: Secondary | ICD-10-CM | POA: Diagnosis not present

## 2022-06-24 NOTE — Patient Instructions (Addendum)
Please contact Dr Cherry's office at [phone] to schedule appointment in 6 months. It was a pleasure seeing you today and thank you for allowing me to participate in your care. -Beckey Rutter, NP

## 2022-06-24 NOTE — Progress Notes (Signed)
Gynecologic Oncology Interval Visit   Referring Provider: Diona Fanti, CNM [place],  [place] [address] [phone]  Chief Concern: endometrial cancer  Subjective:  Allison Dixon is a 61 y.o. G14P7 female, diagnosed with stage IA grade I endometrial cancer s/p TLH BSO with bilateral pelvic SLN mapping (no biopsies) and right periurethral biopsy (negative) on 05/22/20, who returns to clinic for follow-up.   She was diagnosed with stage IA grade 1 endometrial cancer.  She underwent TLH BSO with bilateral pelvic SLN mapping and right periurethral biopsy with Dr. Fransisca Connors and Dr. Marcelline Mates on 05/22/20 with invasion 5/25 mm and negative LVSI, adnexa, cervix and washings. SLN not biopsied due to no mapping because of morbid obesity. Periurethral biopsy negative. Cancer has loss of MLH1 and PMS2 and MLH1 promoter methylation assay positive.   She feels well and denies complaints other than chronic low back pain.  Being seen by someone else for this.   Gynecologic Oncology: Initially seen in consultation from Dani Gobble for consultation for endometrial cancer. She presented with abnormal bleeding, irregular x 6-7 months. Her evaluation included the following tests:   Pelvic US 04/18/2020 The uterus is anteverted and measures 10.2 x 5.4. Echo texture is heterogenous with evidence of focal masses. Within the uterus are multiple suspected fibroids measuring: Fibroid 1:Posterior IM 2.6 x 3.1 x 3.0 cm Fibroid 2:Anterior IM 2.2 x 2.2 x 1.8 cm The Endometrium measures 14 mm. Right Ovary was not seen Left Ovary was not seen Survey of the adnexa demonstrates no adnexal masses. There is no free fluid in the cul de sac.  Endometrial biopsy 04/18/2020  A. 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   DIAGNOSIS:  A.  UTERUS  WITH CERVIX, BILATERAL FALLOPIAN TUBES AND OVARIES; TOTAL  HYSTERECTOMY WITH BILATERAL SALPINGO-OOPHORECTOMY:  - ENDOMETRIAL CARCINOMA.  - SEE CANCER STAGING SUMMARY BELOW.  - INACTIVE BACKGROUND ENDOMETRIUM WITH 2.3 CM ENDOMETRIAL POLYP WITH  ENDOMETRIAL INTRAEPITHELIAL NEOPLASIA.  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  - MYOMETRIUM WITH ADENOMYOSIS.  - BILATERAL OVARIES, NEGATIVE FOR MALIGNANCY.  - BILATERAL FALLOPIAN TUBES, NEGATIVE FOR MALIGNANCY.   B. PERI URETHRA, RIGHT; BIOPSY:  - LENTIGO SIMPLEX.  - HYPERKERATOSIS.  - NEGATIVE FOR DYSPLASIA AND MALIGNANCY.  - DEEPER SECTIONS AND S100 STAIN EXAMINED.   CANCER CASE SUMMARY: ENDOMETRIUM  Procedure: Total hysterectomy and bilateral salpingo-oophorectomy  Tumor Size: Greatest dimension: 4 cm  Histologic Type: Endometrioid carcinoma  Histologic Grade: FIGO grade 1  Myometrial Invasion: Present       Depth of invasion: 5 mm       Myometrial thickness: 25 mm       Percentage of myometrial invasion: 20%  Uterine Serosa Involvement: Not identified  Cervical Stromal Involvement: Not identified  Other Tissue/Organ Involvement: Not applicable  Lymphovascular Invasion: Not identified  Regional Lymph Nodes: No lymph nodes submitted or found  Pathologic Stage Classification (pTNM, AJCC 8th Edition): pT1a pN not  assigned / FIGO IA   Immunohistochemistry (IHC) Testing for DNA Mismatch Repair (MMR)  Proteins:  Results:  MLH1: Loss of protein expression  MSH2: Intact nuclear expression  MSH6: Intact nuclear expression, heterogenous pattern  PMS2: Loss of protein expression   IHC Interpretation: Abnormal result with absent nuclear staining for MLH1 and PMS2, high probability of MSI-H.  This pattern of  deficient DNA repair proteins is most often associated with methylation of the MLH1 gene promoter. Testing for MLH1 promoter methylation has been requested and the result will be reported in  an addendum. Of note, a heterogeneous pattern of MSH-6 staining typically does not indicate germline mutation in MSH6 but is commonly associated with abnormality in another MMR gene such as MLH1 or PMS2, or even other DNA repair genes such as DNA polymerase.  MLH1 promoter methylation positive  Problem List: Patient Active Problem List   Diagnosis Date Noted   Dysphagia    SOB (shortness of breath) 01/27/2022   Cardiac murmur 01/27/2022   Frequency of urination 10/14/2021   Urination pain 10/14/2021   Hematuria 10/14/2021   Vaginal spotting 10/14/2021   History of endometrial cancer 07/29/2021   Primary hypertension 01/14/2021   Prediabetes 01/14/2021   Chronic bilateral low back pain with right-sided sciatica 01/14/2021   Left foot pain 01/14/2021   Morbid obesity ([place]) 01/14/2021   S/P laparoscopic hysterectomy 05/30/2020   Moderate obstructive sleep apnea 05/07/2020   Shortness of breath 05/07/2020   Abnormal uterine bleeding 04/18/2020  Ectopic Pregnancy  Past Medical History: Past Medical History:  Diagnosis Date   Cancer ([place]) 2021   Uterine    COVID    Endometrial cancer ([place])    Hypertension    Morbid obesity ([place])    Sleep apnea     Past Surgical History: Past Surgical History:  Procedure Laterality Date   DILATION AND CURETTAGE, DIAGNOSTIC / THERAPEUTIC     ESOPHAGOGASTRODUODENOSCOPY (EGD) WITH PROPOFOL N/A 02/24/2022   Procedure: ESOPHAGOGASTRODUODENOSCOPY (EGD) WITH PROPOFOL;  Surgeon: Lucilla Lame, MD;  Location: [facility] ENDOSCOPY;  Service: Endoscopy;  Laterality: N/A;   EXPLORATORY LAPAROTOMY     for ectopic pregnancy   FOOT SURGERY Left    TOTAL LAPAROSCOPIC HYSTERECTOMY WITH BILATERAL SALPINGO OOPHORECTOMY Bilateral 05/22/2020   Procedure: TOTAL LAPAROSCOPIC HYSTERECTOMY WITH BILATERAL SALPINGO OOPHORECTOMY AND WASHINGS, PERINEAL BIOPSY;  Surgeon: Mellody Drown, MD;  Location: [facility] ORS;  Service: Gynecology;  Laterality: Bilateral;    Past  Gynecologic History:  As per HPI  OB History:  SVD x 7 OB History  Gravida Para Term Preterm AB Living  7 7 0 0 0 0  SAB IAB Ectopic Multiple Live Births  0 0 0 0 0    # Outcome Date GA Lbr Len/2nd Weight Sex Delivery Anes PTL Lv  7 Para           6 Para           5 Para           4 Para           3 Para           2 Para           1 Para             Obstetric Comments  NSVD x 7    Family History: Family History  Problem Relation Age of Onset   Heart attack Mother    Cancer Mother    Cancer Father     Social History: Social History   Socioeconomic History   Marital status: Widowed    Spouse name: Not on file   Number of children: Not on file   Years of education: Not on file   Highest education level: Not on file  Occupational History   Not on file  Tobacco Use   Smoking status: Never   Smokeless tobacco:  Never  Vaping Use   Vaping Use: Never used  Substance and Sexual Activity   Alcohol use: Never   Drug use: Never   Sexual activity: Not Currently    Birth control/protection: Surgical  Other Topics Concern   Not on file  Social History Narrative   Not on file   Social Determinants of Health   Financial Resource Strain: Low Risk  (05/31/2021)   Overall Financial Resource Strain (CARDIA)    Difficulty of Paying Living Expenses: Not hard at all  Food Insecurity: No Food Insecurity (05/31/2021)   Hunger Vital Sign    Worried About Running Out of Food in the Last Year: Never true    Ran Out of Food in the Last Year: Never true  Transportation Needs: No Transportation Needs (05/31/2021)   PRAPARE - Hydrologist (Medical): No    Lack of Transportation (Non-Medical): No  Physical Activity: Insufficiently Active (05/31/2021)   Exercise Vital Sign    Days of Exercise per Week: 2 days    Minutes of Exercise per Session: 10 min  Stress: No Stress Concern Present (05/31/2021)   [place]    Feeling of Stress : Not at all  Social Connections: Socially Isolated (05/31/2021)   Social Connection and Isolation Panel [NHANES]    Frequency of Communication with Friends and Family: Once a week    Frequency of Social Gatherings with Friends and Family: Once a week    Attends Religious Services: 1 to 4 times per year    Active Member of [employer] or Organizations: No    Attends Archivist Meetings: Never    Marital Status: Widowed  Intimate Partner Violence: Not At Risk (05/31/2021)   Humiliation, Afraid, Rape, and Kick questionnaire    Fear of Current or Ex-Partner: No    Emotionally Abused: No    Physically Abused: No    Sexually Abused: No   Allergies: No Known Allergies  Current Medications: Current Outpatient Medications  Medication Sig Dispense Refill   amLODipine (NORVASC) 5 MG tablet Take 1 tablet (5 mg total) by mouth daily. 180 tablet 3   fluticasone (FLONASE) 50 MCG/ACT nasal spray Place 2 sprays into both nostrils daily.     gabapentin (NEURONTIN) 300 MG capsule Take 1 capsule (300 mg total) by mouth at bedtime. 30 capsule 3   ibuprofen (ADVIL) 800 MG tablet Take 1 tablet (800 mg total) by mouth every 8 (eight) hours as needed. 60 tablet 1   meloxicam (MOBIC) 15 MG tablet Take 1 tablet (15 mg total) by mouth daily. 30 tablet 1   No current facility-administered medications for this visit.   Review of Systems General:  no complaints Skin: no complaints Eyes: no complaints HEENT: no complaints Breasts: no complaints Pulmonary: no complaints Cardiac: no complaints Gastrointestinal: no complaints Genitourinary/Sexual: no complaints Ob/Gyn: no complaints Musculoskeletal: no complaints Hematology: no complaints Neurologic/Psych: no complaints   Objective:  Physical Examination:  BP 137/72   Pulse 74   Temp 98.7 F (37.1 C)   Resp 18   Wt 245 lb 8 oz (111.4 kg)   LMP  (LMP Unknown)   SpO2 97%   BMI 44.90 kg/m    ECOG  Performance Status: 1 - Symptomatic but completely ambulatory  GENERAL: Patient is a well appearing female in no acute distress HEENT:  Sclera clear. Anicteric NODES:  Negative axillary, supraclavicular, inguinal lymph node survery LUNGS:  Clear to auscultation  bilaterally.   HEART:  Regular rate and rhythm.  ABDOMEN:  Soft, nontender.  No hernias, incisions well healed. No masses or ascites EXTREMITIES:  No peripheral edema. Atraumatic. No cyanosis SKIN:  Clear with no obvious rashes or skin changes.  NEURO:  Nonfocal. Well oriented.  Appropriate affect.  Pelvic exam: Exam chaperoned by nursing. Normal external genitalia, vulva, vagina, cervix, uterus and adnexa, VULVA: normal appearing vulva with no masses, tenderness or lesions, VAGINA: normal appearing vagina with normal color and discharge, no lesions, CERVIX: surgically absent, UTERUS: surgically absent, vaginal cuff well healed, BME  nontender and no masses   Lab Review No labs on site today  Radiologic Imaging: As per HPI   Assessment:  Allison Dixon is a 61 y.o. female diagnosed with stage IA grade 1 endometrial cancer.  She underwent TLH BSO with bilateral pelvic SLN mapping and right periurethral biopsy with Dr. Fransisca Connors and Dr. Marcelline Mates on 05/22/20 with invasion 5/25 mm and negative LVSI, adnexa, cervix and washings. SLN not biopsied due to no mapping because of morbid obesity. Periurethral biopsy negative. Clincally NED.   Cancer has loss of MLH1 and PMS2 and MLH1 promoter methylation assay positive. No need for Lynch Syndrome testing.   History of exploratory laparotomy for ectopic pregnancy.    Medical co-morbidities complicating care:  morbid obesity (MBI 42), HTN and prior abdominal surgery.  Plan:   Problem List Items Addressed This Visit   None Visit Diagnoses     Encounter for follow-up surveillance of endometrial cancer    -  Primary      Follow up with Dr. Marcelline Mates in 6 months and with [place] in a year.  Can return  sooner if any bleeding, discharge, pain or other concerning symptoms.  Verlon Au, NP   I personally interviewed and examined the patient. Agreed with the above/below plan of care. I have directly contributed to assessment and plan of care of this patient and educated and discussed with patient and family.  Mellody Drown, MD

## 2022-07-01 ENCOUNTER — Ambulatory Visit (INDEPENDENT_AMBULATORY_CARE_PROVIDER_SITE_OTHER): Payer: Medicare Other | Admitting: *Deleted

## 2022-07-01 DIAGNOSIS — Z0000 Encounter for general adult medical examination without abnormal findings: Secondary | ICD-10-CM

## 2022-07-01 NOTE — Progress Notes (Signed)
Subjective:   GENOVA KINER is a 61 y.o. female who presents for an Initial Medicare Annual Wellness Visit.  I connected with  [name] on 07/01/22 by a telephone enabled telemedicine application and verified that I am speaking with the correct person using two identifiers.   I discussed the limitations of evaluation and management by telemedicine. The patient expressed understanding and agreed to proceed.  Patient location: home  Provider location:  Tele-health-home    Review of Systems           Objective:    Today's Vitals   07/01/22 1307  PainSc: 8    There is no height or weight on file to calculate BMI.     07/01/2022    1:11 PM 06/24/2022   10:21 AM 02/24/2022   10:27 AM 05/31/2021   10:19 AM 12/25/2020    2:06 PM 08/27/2020    4:13 PM 06/25/2020    1:50 PM  Advanced Directives  Does Patient Have a Medical Advance Directive? No No No No No No No  Would patient like information on creating a medical advance directive? No - Patient declined No - Patient declined No - Patient declined No - Patient declined  No - Patient declined No - Patient declined    Current Medications (verified) Outpatient Encounter Medications as of 07/01/2022  Medication Sig   fluticasone (FLONASE) 50 MCG/ACT nasal spray Place 2 sprays into both nostrils daily.   gabapentin (NEURONTIN) 300 MG capsule Take 1 capsule (300 mg total) by mouth at bedtime.   ibuprofen (ADVIL) 800 MG tablet Take 1 tablet (800 mg total) by mouth every 8 (eight) hours as needed.   meloxicam (MOBIC) 15 MG tablet Take 1 tablet (15 mg total) by mouth daily.   amLODipine (NORVASC) 5 MG tablet Take 1 tablet (5 mg total) by mouth daily.   No facility-administered encounter medications on file as of 07/01/2022.    Allergies (verified) Patient has no known allergies.   History: Past Medical History:  Diagnosis Date   Cancer ([place]) 2021   Uterine    COVID    Endometrial cancer ([place])    Hypertension    Morbid  obesity ([place])    Sleep apnea    Past Surgical History:  Procedure Laterality Date   DILATION AND CURETTAGE, DIAGNOSTIC / THERAPEUTIC     ESOPHAGOGASTRODUODENOSCOPY (EGD) WITH PROPOFOL N/A 02/24/2022   Procedure: ESOPHAGOGASTRODUODENOSCOPY (EGD) WITH PROPOFOL;  Surgeon: Lucilla Lame, MD;  Location: [facility] ENDOSCOPY;  Service: Endoscopy;  Laterality: N/A;   EXPLORATORY LAPAROTOMY     for ectopic pregnancy   FOOT SURGERY Left    TOTAL LAPAROSCOPIC HYSTERECTOMY WITH BILATERAL SALPINGO OOPHORECTOMY Bilateral 05/22/2020   Procedure: TOTAL LAPAROSCOPIC HYSTERECTOMY WITH BILATERAL SALPINGO OOPHORECTOMY AND WASHINGS, PERINEAL BIOPSY;  Surgeon: Mellody Drown, MD;  Location: [facility] ORS;  Service: Gynecology;  Laterality: Bilateral;   Family History  Problem Relation Age of Onset   Heart attack Mother    Cancer Mother    Cancer Father    Social History   Socioeconomic History   Marital status: Widowed    Spouse name: Not on file   Number of children: Not on file   Years of education: Not on file   Highest education level: Not on file  Occupational History   Not on file  Tobacco Use   Smoking status: Never   Smokeless tobacco: Never  Vaping Use   Vaping Use: Never used  Substance and Sexual Activity   Alcohol use:  Never   Drug use: Never   Sexual activity: Not Currently    Birth control/protection: Surgical  Other Topics Concern   Not on file  Social History Narrative   Not on file   Social Determinants of Health   Financial Resource Strain: Low Risk  (07/01/2022)   Overall Financial Resource Strain (CARDIA)    Difficulty of Paying Living Expenses: Not hard at all  Food Insecurity: No Food Insecurity (07/01/2022)   Hunger Vital Sign    Worried About Running Out of Food in the Last Year: Never true    Ran Out of Food in the Last Year: Never true  Transportation Needs: No Transportation Needs (07/01/2022)   PRAPARE - Hydrologist (Medical): No     Lack of Transportation (Non-Medical): No  Physical Activity: Inactive (07/01/2022)   Exercise Vital Sign    Days of Exercise per Week: 0 days    Minutes of Exercise per Session: 0 min  Stress: No Stress Concern Present (07/01/2022)   [place]    Feeling of Stress : Not at all  Social Connections: Moderately Integrated (07/01/2022)   Social Connection and Isolation Panel [NHANES]    Frequency of Communication with Friends and Family: Three times a week    Frequency of Social Gatherings with Friends and Family: Twice a week    Attends Religious Services: More than 4 times per year    Active Member of [employer] or Organizations: Yes    Attends Archivist Meetings: More than 4 times per year    Marital Status: Widowed    Tobacco Counseling Counseling given: Not Answered   Clinical Intake:  Pre-visit preparation completed: Yes  Pain : 0-10 Pain Score: 8  Pain Type: Chronic pain Pain Location: Back Pain Onset: More than a month ago Pain Frequency: Constant     Diabetes: No  How often do you need to have someone help you when you read instructions, pamphlets, or other written materials from your doctor or pharmacy?: 1 - Never  Diabetic?  no  Interpreter Needed?: No  Information entered by :: Leroy Kennedy LPN   Activities of Daily Living     No data to display          Patient Care Team: Charlynne Cousins, MD (Inactive) as PCP - General (Internal Medicine) Kate Sable, MD as PCP - Cardiology (Cardiology) Kate Sable, MD as Consulting Physician (Cardiology) Clent Jacks, RN as Oncology Nurse Navigator Mellody Drown, MD as Referring Physician (Obstetrics)  Indicate any recent Medical Services you may have received from other than [facility] providers in the past year (date may be approximate).     Assessment:   This is a routine wellness examination for [place].  Hearing/Vision  screen Hearing Screening - Comments:: No trouble hearing Vision Screening - Comments:: Not up to date  Dietary issues and exercise activities discussed:     Goals Addressed             This Visit's Progress    Patient Stated       No goals       Depression Screen    07/01/2022    1:13 PM 01/27/2022    9:37 AM 10/14/2021   10:52 AM 07/29/2021    8:27 AM 05/31/2021    9:39 AM 05/31/2021    9:38 AM 01/14/2021    8:18 AM  PHQ 2/9 Scores  PHQ - 2 Score 0  2 2 0 1 1 0  PHQ- 9 Score 0 '3 4 6 11  5    '$ Fall Risk    07/01/2022    1:18 PM 01/27/2022    9:37 AM 10/14/2021   10:52 AM 07/29/2021    8:27 AM 05/31/2021    9:38 AM  Fall Risk   Falls in the past year? 0 0 0 0 0  Number falls in past yr: 0 0 0 0 0  Injury with Fall? 0 0 0 0 0  Risk for fall due to :  No Fall Risks No Fall Risks No Fall Risks No Fall Risks  Follow up Falls evaluation completed;Education provided;Falls prevention discussed Falls evaluation completed Falls evaluation completed Falls evaluation completed Falls evaluation completed    FALL RISK PREVENTION PERTAINING TO THE HOME:  Any stairs in or around the home? Yes  If so, are there any without handrails? No  Home free of loose throw rugs in walkways, pet beds, electrical cords, etc? Yes  Adequate lighting in your home to reduce risk of falls? Yes   ASSISTIVE DEVICES UTILIZED TO PREVENT FALLS:  Life alert? No  Use of a cane, walker or w/c? No  Grab bars in the bathroom? No  Shower chair or bench in shower? No  Elevated toilet seat or a handicapped toilet? No   TIMED UP AND GO:  Was the test performed? No .    Cognitive Function:        07/01/2022    1:10 PM 05/31/2021    9:36 AM  6CIT Screen  What Year? 0 points 0 points  What month? 0 points 0 points  What time? 0 points 0 points  Count back from 20 0 points 0 points  Months in reverse 0 points 0 points  Repeat phrase 0 points 0 points  Total Score 0 points 0 points     Immunizations Immunization History  Administered Date(s) Administered   Td 07/29/2021    TDAP status: Up to date  Flu Vaccine status: Declined, Education has been provided regarding the importance of this vaccine but patient still declined. Advised may receive this vaccine at local pharmacy or Health Dept. Aware to provide a copy of the vaccination record if obtained from local pharmacy or Health Dept. Verbalized acceptance and understanding.    Covid-19 vaccine status: Declined, Education has been provided regarding the importance of this vaccine but patient still declined. Advised may receive this vaccine at local pharmacy or Health Dept.or vaccine clinic. Aware to provide a copy of the vaccination record if obtained from local pharmacy or Health Dept. Verbalized acceptance and understanding.  Qualifies for Shingles Vaccine? Yes   Zostavax completed No   Shingrix Completed?: No.    Education has been provided regarding the importance of this vaccine. Patient has been advised to call insurance company to determine out of pocket expense if they have not yet received this vaccine. Advised may also receive vaccine at local pharmacy or Health Dept. Verbalized acceptance and understanding.  Screening Tests Health Maintenance  Topic Date Due   COVID-19 Vaccine (1) 07/17/2022 (Originally 04/03/1966)   Zoster Vaccines- Shingrix (1 of 2) 10/01/2022 (Originally 04/03/1982)   INFLUENZA VACCINE  12/06/2022 (Originally 04/07/2022)   MAMMOGRAM  07/02/2023 (Originally 04/03/2013)   COLONOSCOPY (Pts 45-12yr Insurance coverage will need to be confirmed)  07/02/2023 (Originally 04/03/2008)   PAP SMEAR-Modifier  04/27/2023   Medicare Annual Wellness (AWV)  08/01/2023   TETANUS/TDAP  07/30/2031   Hepatitis C Screening  Completed   HIV Screening  Completed   HPV VACCINES  Aged Out    Health Maintenance  There are no preventive care reminders to display for this patient.   Colonoscopy  Declined  Mammogram Declined    Lung Cancer Screening: (Low Dose CT Chest recommended if Age 25-80 years, 30 pack-year currently smoking OR have quit w/in 15years.) does not qualify.   Lung Cancer Screening Referral:   Additional Screening:  Hepatitis C Screening: does not qualify; Completed 2022  Vision Screening: Recommended annual ophthalmology exams for early detection of glaucoma and other disorders of the eye. Is the patient up to date with their annual eye exam?  No  Who is the provider or what is the name of the office in which the patient attends annual eye exams?  If pt is not established with a provider, would they like to be referred to a provider to establish care? No .   Dental Screening: Recommended annual dental exams for proper oral hygiene  Community Resource Referral / Chronic Care Management: CRR required this visit?  No   CCM required this visit?  No      Plan:     I have personally reviewed and noted the following in the patient's chart:   Medical and social history Use of alcohol, tobacco or illicit drugs  Current medications and supplements including opioid prescriptions. Patient is not currently taking opioid prescriptions. Functional ability and status Nutritional status Physical activity Advanced directives List of other physicians Hospitalizations, surgeries, and ER visits in previous 12 months Vitals Screenings to include cognitive, depression, and falls Referrals and appointments  In addition, I have reviewed and discussed with patient certain preventive protocols, quality metrics, and best practice recommendations. A written personalized care plan for preventive services as well as general preventive health recommendations were provided to patient.     Leroy Kennedy, LPN   [id number]   Nurse Notes:

## 2022-07-01 NOTE — Patient Instructions (Signed)
Allison Dixon , Thank you for taking time to come for your Medicare Wellness Visit. I appreciate your ongoing commitment to your health goals. Please review the following plan we discussed and let me know if I can assist you in the future.   These are the goals we discussed:  Goals      Patient Stated     No goals        This is a list of the screening recommended for you and due dates:  Health Maintenance  Topic Date Due   COVID-19 Vaccine (1) 07/17/2022*   Zoster (Shingles) Vaccine (1 of 2) 10/01/2022*   Flu Shot  12/06/2022*   Mammogram  07/02/2023*   Colon Cancer Screening  07/02/2023*   Pap Smear  04/27/2023   Medicare Annual Wellness Visit  08/01/2023   Tetanus Vaccine  07/30/2031   Hepatitis C Screening: USPSTF Recommendation to screen - Ages 18-79 yo.  Completed   HIV Screening  Completed   HPV Vaccine  Aged Out  *Topic was postponed. The date shown is not the original due date.    Advanced directives: Education provided  Conditions/risks identified:   Next appointment: Follow up in one year for your annual wellness visit. [id number] @ 8:20   [place] 40-64 Years, Female Preventive care refers to lifestyle choices and visits with your health care provider that can promote health and wellness. What does preventive care include? A yearly physical exam. This is also called an annual well check. Dental exams once or twice a year. Routine eye exams. Ask your health care provider how often you should have your eyes checked. Personal lifestyle choices, including: Daily care of your teeth and gums. Regular physical activity. Eating a healthy diet. Avoiding tobacco and drug use. Limiting alcohol use. Practicing safe sex. Taking low-dose aspirin daily starting at age 44. Taking vitamin and mineral supplements as recommended by your health care provider. What happens during an annual well check? The services and screenings done by your health care provider  during your annual well check will depend on your age, overall health, lifestyle risk factors, and family history of disease. Counseling  Your health care provider may ask you questions about your: Alcohol use. Tobacco use. Drug use. Emotional well-being. Home and relationship well-being. Sexual activity. Eating habits. Work and work Statistician. Method of birth control. Menstrual cycle. Pregnancy history. Screening  You may have the following tests or measurements: Height, weight, and BMI. Blood pressure. Lipid and cholesterol levels. These may be checked every 5 years, or more frequently if you are over 31 years old. Skin check. Lung cancer screening. You may have this screening every year starting at age 51 if you have a 30-pack-year history of smoking and currently smoke or have quit within the past 15 years. Fecal occult blood test (FOBT) of the stool. You may have this test every year starting at age 24. Flexible sigmoidoscopy or colonoscopy. You may have a sigmoidoscopy every 5 years or a colonoscopy every 10 years starting at age 103. Hepatitis C blood test. Hepatitis B blood test. Sexually transmitted disease (STD) testing. Diabetes screening. This is done by checking your blood sugar (glucose) after you have not eaten for a while (fasting). You may have this done every 1-3 years. Mammogram. This may be done every 1-2 years. Talk to your health care provider about when you should start having regular mammograms. This may depend on whether you have a family history of breast cancer. BRCA-related cancer  screening. This may be done if you have a family history of breast, ovarian, tubal, or peritoneal cancers. Pelvic exam and Pap test. This may be done every 3 years starting at age 33. Starting at age 19, this may be done every 5 years if you have a Pap test in combination with an HPV test. Bone density scan. This is done to screen for osteoporosis. You may have this scan if you are  at high risk for osteoporosis. Discuss your test results, treatment options, and if necessary, the need for more tests with your health care provider. Vaccines  Your health care provider may recommend certain vaccines, such as: Influenza vaccine. This is recommended every year. Tetanus, diphtheria, and acellular pertussis (Tdap, Td) vaccine. You may need a Td booster every 10 years. Zoster vaccine. You may need this after age 58. Pneumococcal 13-valent conjugate (PCV13) vaccine. You may need this if you have certain conditions and were not previously vaccinated. Pneumococcal polysaccharide (PPSV23) vaccine. You may need one or two doses if you smoke cigarettes or if you have certain conditions. Talk to your health care provider about which screenings and vaccines you need and how often you need them. This information is not intended to replace advice given to you by your health care provider. Make sure you discuss any questions you have with your health care provider. Document Released: 09/20/2015 Document Revised: 05/13/2016 Document Reviewed: 06/25/2015 Elsevier Interactive Patient Education  2017 [place] Prevention in the Home Falls can cause injuries. They can happen to people of all ages. There are many things you can do to make your home safe and to help prevent falls. What can I do on the outside of my home? Regularly fix the edges of walkways and driveways and fix any cracks. Remove anything that might make you trip as you walk through a door, such as a raised step or threshold. Trim any bushes or trees on the path to your home. Use bright outdoor lighting. Clear any walking paths of anything that might make someone trip, such as rocks or tools. Regularly check to see if handrails are loose or broken. Make sure that both sides of any steps have handrails. Any raised decks and porches should have guardrails on the edges. Have any leaves, snow, or ice cleared  regularly. Use sand or salt on walking paths during winter. Clean up any spills in your garage right away. This includes oil or grease spills. What can I do in the bathroom? Use night lights. Install grab bars by the toilet and in the tub and shower. Do not use towel bars as grab bars. Use non-skid mats or decals in the tub or shower. If you need to sit down in the shower, use a plastic, non-slip stool. Keep the floor dry. Clean up any water that spills on the floor as soon as it happens. Remove soap buildup in the tub or shower regularly. Attach bath mats securely with double-sided non-slip rug tape. Do not have throw rugs and other things on the floor that can make you trip. What can I do in the bedroom? Use night lights. Make sure that you have a light by your bed that is easy to reach. Do not use any sheets or blankets that are too big for your bed. They should not hang down onto the floor. Have a firm chair that has side arms. You can use this for support while you get dressed. Do not have throw rugs  and other things on the floor that can make you trip. What can I do in the kitchen? Clean up any spills right away. Avoid walking on wet floors. Keep items that you use a lot in easy-to-reach places. If you need to reach something above you, use a strong step stool that has a grab bar. Keep electrical cords out of the way. Do not use floor polish or wax that makes floors slippery. If you must use wax, use non-skid floor wax. Do not have throw rugs and other things on the floor that can make you trip. What can I do with my stairs? Do not leave any items on the stairs. Make sure that there are handrails on both sides of the stairs and use them. Fix handrails that are broken or loose. Make sure that handrails are as long as the stairways. Check any carpeting to make sure that it is firmly attached to the stairs. Fix any carpet that is loose or worn. Avoid having throw rugs at the top or  bottom of the stairs. If you do have throw rugs, attach them to the floor with carpet tape. Make sure that you have a light switch at the top of the stairs and the bottom of the stairs. If you do not have them, ask someone to add them for you. What else can I do to help prevent falls? Wear shoes that: Do not have high heels. Have rubber bottoms. Are comfortable and fit you well. Are closed at the toe. Do not wear sandals. If you use a stepladder: Make sure that it is fully opened. Do not climb a closed stepladder. Make sure that both sides of the stepladder are locked into place. Ask someone to hold it for you, if possible. Clearly mark and make sure that you can see: Any grab bars or handrails. First and last steps. Where the edge of each step is. Use tools that help you move around (mobility aids) if they are needed. These include: Canes. Walkers. Scooters. Crutches. Turn on the lights when you go into a dark area. Replace any light bulbs as soon as they burn out. Set up your furniture so you have a clear path. Avoid moving your furniture around. If any of your floors are uneven, fix them. If there are any pets around you, be aware of where they are. Review your medicines with your doctor. Some medicines can make you feel dizzy. This can increase your chance of falling. Ask your doctor what other things that you can do to help prevent falls. This information is not intended to replace advice given to you by your health care provider. Make sure you discuss any questions you have with your health care provider. Document Released: 06/20/2009 Document Revised: 01/30/2016 Document Reviewed: 09/28/2014 Elsevier Interactive Patient Education  2017 [employer].

## 2022-08-04 ENCOUNTER — Encounter: Payer: Self-pay | Admitting: Unknown Physician Specialty

## 2022-08-04 ENCOUNTER — Ambulatory Visit (INDEPENDENT_AMBULATORY_CARE_PROVIDER_SITE_OTHER): Payer: Medicare Other | Admitting: Unknown Physician Specialty

## 2022-08-04 VITALS — BP 120/82 | HR 114 | Temp 99.6°F | Wt 242.6 lb

## 2022-08-04 DIAGNOSIS — R52 Pain, unspecified: Secondary | ICD-10-CM | POA: Diagnosis not present

## 2022-08-04 DIAGNOSIS — R509 Fever, unspecified: Secondary | ICD-10-CM

## 2022-08-04 DIAGNOSIS — R7303 Prediabetes: Secondary | ICD-10-CM | POA: Diagnosis not present

## 2022-08-04 DIAGNOSIS — I10 Essential (primary) hypertension: Secondary | ICD-10-CM

## 2022-08-04 LAB — VERITOR FLU A/B WAIVED
Influenza A: NEGATIVE
Influenza B: NEGATIVE

## 2022-08-04 NOTE — Assessment & Plan Note (Signed)
Stable, continue present medications.   

## 2022-08-04 NOTE — Assessment & Plan Note (Signed)
Unable to address lifestyle.  Check labs

## 2022-08-04 NOTE — Progress Notes (Signed)
BP 120/82   Pulse (!) 114   Temp 99.6 F (37.6 C) (Oral)   Wt 242 lb 9.6 oz (110 kg)   LMP  (LMP Unknown)   SpO2 97%   BMI 44.37 kg/m    Subjective:    Patient ID: Allison Dixon, female    DOB: Sep 27, 1960, 61 y.o.   MRN: [id number]  HPI: Allison Dixon is a 61 y.o. female  Chief Complaint  Patient presents with   Hypertension   URI    Pt states she started having a cough, body aches, and chills yesterday   Hypertension Using medications without difficulty Average home BPs: Typically 140/70   No problems or lightheadedness No chest pain with exertion or shortness of breath No Edema  URI  This is a new problem. The current episode started yesterday. The problem has been unchanged. Maximum temperature: chills. Associated symptoms include coughing, headaches, joint pain, rhinorrhea and a sore throat. Pertinent negatives include no abdominal pain, chest pain, congestion, diarrhea, dysuria, ear pain, joint swelling, nausea, neck pain, plugged ear sensation, rash, sinus pain, sneezing, swollen glands, vomiting or wheezing. She has tried nothing for the symptoms.     The 10-year ASCVD risk score (Arnett DK, et al., 2019) is: 3.2%   Values used to calculate the score:     Age: 89 years     Sex: Female     Is Non-Hispanic African American: Yes     Diabetic: No     Tobacco smoker: No     Systolic Blood Pressure: 341 mmHg     Is BP treated: No     HDL Cholesterol: 64 mg/dL     Total Cholesterol: 179 mg/dL   Relevant past medical, surgical, family and social history reviewed and updated as indicated. Interim medical history since our last visit reviewed. Allergies and medications reviewed and updated.  Review of Systems  HENT:  Positive for rhinorrhea and sore throat. Negative for congestion, ear pain, sinus pain and sneezing.   Respiratory:  Positive for cough. Negative for wheezing.   Cardiovascular:  Negative for chest pain.  Gastrointestinal:  Negative for abdominal pain,  diarrhea, nausea and vomiting.  Genitourinary:  Negative for dysuria.  Musculoskeletal:  Positive for joint pain. Negative for neck pain.  Skin:  Negative for rash.  Neurological:  Positive for headaches.    Per HPI unless specifically indicated above     Objective:    BP 120/82   Pulse (!) 114   Temp 99.6 F (37.6 C) (Oral)   Wt 242 lb 9.6 oz (110 kg)   LMP  (LMP Unknown)   SpO2 97%   BMI 44.37 kg/m   Wt Readings from Last 3 Encounters:  08/04/22 242 lb 9.6 oz (110 kg)  06/24/22 245 lb 8 oz (111.4 kg)  02/24/22 246 lb (111.6 kg)    Physical Exam Constitutional:      General: She is not in acute distress.    Appearance: Normal appearance. She is well-developed.  HENT:     Head: Normocephalic and atraumatic.     Right Ear: Tympanic membrane and ear canal normal.     Left Ear: Tympanic membrane and ear canal normal.     Nose: Rhinorrhea present.     Right Sinus: No maxillary sinus tenderness or frontal sinus tenderness.     Left Sinus: No maxillary sinus tenderness or frontal sinus tenderness.     Mouth/Throat:     Pharynx: Posterior oropharyngeal erythema present.  Eyes:     General: Lids are normal. No scleral icterus.       Right eye: No discharge.        Left eye: No discharge.     Conjunctiva/sclera: Conjunctivae normal.  Cardiovascular:     Rate and Rhythm: Normal rate and regular rhythm.  Pulmonary:     Effort: Pulmonary effort is normal. No respiratory distress.     Breath sounds: Normal breath sounds.  Abdominal:     Palpations: There is no hepatomegaly or splenomegaly.  Musculoskeletal:        General: Normal range of motion.     Cervical back: Normal range of motion and neck supple.  Skin:    General: Skin is warm and dry.     Coloration: Skin is not pale.     Findings: No rash.  Neurological:     General: No focal deficit present.     Mental Status: She is alert and oriented to person, place, and time.  Psychiatric:        Behavior: Behavior  normal.        Thought Content: Thought content normal.        Judgment: Judgment normal.     Results for orders placed or performed in visit on 08/04/22  Veritor Flu A/B Waived  Result Value Ref Range   Influenza A Negative Negative   Influenza B Negative Negative      Assessment & Plan:   Problem List Items Addressed This Visit       Unprioritized   Morbid obesity ([place])    Unable to address lifestyle.  Check labs      Prediabetes   Relevant Orders   HgB A1c   Primary hypertension    Stable, continue present medications.        Relevant Orders   Lipid Panel w/o Chol/HDL Ratio   Comprehensive metabolic panel   Other Visit Diagnoses     Body aches    -  Primary   Flu is negative. Symptoms consistent with Covid.  Asked to get a home Covid in addition to one in office.  Check CBC. Discussed Tylenol and supportive care.   Relevant Orders   Veritor Flu A/B Waived (Completed)   Novel Coronavirus, NAA ([facility])   CBC with Differential/Platelet   Fever, unspecified fever cause       Mild fever today.  Discussed Tylenol and supportive care.  Home Covid test.  Will need treatment if that or office test is positive.   Relevant Orders   CBC with Differential/Platelet       To the ER for SOB or inability to tolerate fluids.  Follow up plan: Return in about 6 months (around 02/02/2023).  If Covid positive consider Paxlovid

## 2022-08-05 LAB — COMPREHENSIVE METABOLIC PANEL
ALT: 9 IU/L (ref 0–32)
AST: 16 IU/L (ref 0–40)
Albumin/Globulin Ratio: 1.1 — ABNORMAL LOW (ref 1.2–2.2)
Albumin: 4.1 g/dL (ref 3.8–4.9)
Alkaline Phosphatase: 87 IU/L (ref 44–121)
BUN/Creatinine Ratio: 12 (ref 9–23)
BUN: 10 mg/dL (ref 6–24)
Bilirubin Total: 0.7 mg/dL (ref 0.0–1.2)
CO2: 24 mmol/L (ref 20–29)
Calcium: 9.6 mg/dL (ref 8.7–10.2)
Chloride: 105 mmol/L (ref 96–106)
Creatinine, Ser: 0.86 mg/dL (ref 0.57–1.00)
Globulin, Total: 3.7 g/dL (ref 1.5–4.5)
Glucose: 107 mg/dL — ABNORMAL HIGH (ref 70–99)
Potassium: 3.9 mmol/L (ref 3.5–5.2)
Sodium: 143 mmol/L (ref 134–144)
Total Protein: 7.8 g/dL (ref 6.0–8.5)
eGFR: 78 mL/min/{1.73_m2} (ref >59)

## 2022-08-05 LAB — CBC WITH DIFFERENTIAL/PLATELET
Basophils Absolute: 0 10*3/uL (ref 0.0–0.2)
Basos: 0 %
EOS (ABSOLUTE): 0.1 10*3/uL (ref 0.0–0.4)
Eos: 1 %
Hematocrit: 43.8 % (ref 34.0–46.6)
Hemoglobin: 14.6 g/dL (ref 11.1–15.9)
Immature Grans (Abs): 0 10*3/uL (ref 0.0–0.1)
Immature Granulocytes: 0 %
Lymphocytes Absolute: 1.1 10*3/uL (ref 0.7–3.1)
Lymphs: 14 %
MCH: 28.8 pg (ref 26.6–33.0)
MCHC: 33.3 g/dL (ref 31.5–35.7)
MCV: 86 fL (ref 79–97)
Monocytes Absolute: 1 10*3/uL — ABNORMAL HIGH (ref 0.1–0.9)
Monocytes: 13 %
Neutrophils Absolute: 5.7 10*3/uL (ref 1.4–7.0)
Neutrophils: 72 %
Platelets: 255 10*3/uL (ref 150–450)
RBC: 5.07 x10E6/uL (ref 3.77–5.28)
RDW: 12.6 % (ref 11.7–15.4)
WBC: 7.9 10*3/uL (ref 3.4–10.8)

## 2022-08-05 LAB — HEMOGLOBIN A1C
Est. average glucose Bld gHb Est-mCnc: 117 mg/dL
Hgb A1c MFr Bld: 5.7 % — ABNORMAL HIGH (ref 4.8–5.6)

## 2022-08-05 LAB — LIPID PANEL W/O CHOL/HDL RATIO
Cholesterol, Total: 189 mg/dL (ref 100–199)
HDL: 73 mg/dL (ref >39)
LDL Chol Calc (NIH): 104 mg/dL — ABNORMAL HIGH (ref 0–99)
Triglycerides: 62 mg/dL (ref 0–149)
VLDL Cholesterol Cal: 12 mg/dL (ref 5–40)

## 2022-08-05 LAB — NOVEL CORONAVIRUS, NAA: SARS-CoV-2, NAA: NOT DETECTED

## 2022-08-06 NOTE — Progress Notes (Signed)
Contacted via MyChart   Good afternoon, your Covid testing returned negative!!  Great news!!

## 2022-08-19 ENCOUNTER — Other Ambulatory Visit: Payer: Self-pay | Admitting: *Deleted

## 2022-08-19 MED ORDER — AMLODIPINE BESYLATE 5 MG PO TABS: 5.0000 mg | ORAL_TABLET | Freq: Every day | ORAL | 0 refills | Status: DC

## 2022-08-28 ENCOUNTER — Encounter: Payer: Self-pay | Admitting: Unknown Physician Specialty

## 2022-10-09 ENCOUNTER — Ambulatory Visit: Payer: Self-pay

## 2022-10-09 NOTE — Telephone Encounter (Signed)
    Chief Complaint: Abdominal pain, at the belly button. No availability, asking to be worked in. Symptoms: Above Frequency: Yesterday  Pertinent Negatives: Patient denies  Disposition: '[x]'$ ED /'[]'$ Urgent Care (no appt availability in office) / '[]'$ Appointment(In office/virtual)/ '[]'$  [facility]/ '[]'$ Home Care/ '[]'$ Refused Recommended Disposition /'[]'$ [facility]/ '[]'$  Follow-up with PCP Additional Notes: Asking to be worked in bbbbb   Answer Assessment - Initial Assessment Questions 1. LOCATION: "Where does it hurt?"      Belly b 2. RADIATION: "Does the pain shoot anywhere else?" (e.g., chest, back)     back 3. ONSET: "When did the pain begin?" (e.g., minutes, hours or days ago)      yesterday 4. SUDDEN: "Gradual or sudden onset?"     Sudden 5. PATTERN "Does the pain come and go, or is it constant?"    - If it comes and goes: "How long does it last?" "Do you have pain now?"     (Note: Comes and goes means the pain is intermittent. It goes away completely between bouts.)    - If constant: "Is it getting better, staying the same, or getting worse?"      (Note: Constant means the pain never goes away completely; most serious pain is constant and gets worse.)      Comesfnds 6. SEVERITY: "How bad is the pain?"  (e.g., Scale 1-10; mild, moderate, or severe)    - MILD (1-3): Doesn't interfere with normal activities, abdomen soft and not tender to touch.     - MODERATE (4-7): Interferes with normal activities or awakens from sleep, abdomen tender to touch.     - SEVERE (8-10): Excruciating pain, doubled over, unable to do any normal activities.       8 7. RECURRENT SYMPTOM: "Have you ever had this type of stomach pain before?" If Yes, ask: "When was the last time?" and "What happened that time?"      Yes 8. CAUSE: "What do you think is causing the stomach pain?"     Unsure 9. RELIEVING/AGGRAVATING FACTORS: "What makes it better or worse?" (e.g., antacids, bending or twisting  motion, bowel movement)     No 10. OTHER SYMPTOMS: "Do you have any other symptoms?" (e.g., back pain, diarrhea, fever, urination pain, vomiting)       None 11. PREGNANCY: "Is there any chance you are pregnant?" "When was your last menstrual period?"       No  Protocols used: Abdominal Pain - [facility]

## 2022-10-12 NOTE — Telephone Encounter (Signed)
Called pt to get her scheduled with next available appointment with any provider for abdominal pain

## 2022-11-06 ENCOUNTER — Encounter: Payer: Self-pay | Admitting: Primary Care

## 2022-11-06 ENCOUNTER — Ambulatory Visit (INDEPENDENT_AMBULATORY_CARE_PROVIDER_SITE_OTHER): Payer: Medicare Other | Admitting: Primary Care

## 2022-11-06 VITALS — BP 128/82 | HR 83 | Ht 62.0 in | Wt 241.6 lb

## 2022-11-06 DIAGNOSIS — G4733 Obstructive sleep apnea (adult) (pediatric): Secondary | ICD-10-CM

## 2022-11-06 DIAGNOSIS — R0609 Other forms of dyspnea: Secondary | ICD-10-CM

## 2022-11-06 DIAGNOSIS — R0602 Shortness of breath: Secondary | ICD-10-CM | POA: Diagnosis not present

## 2022-11-06 MED ORDER — ALBUTEROL SULFATE HFA 108 (90 BASE) MCG/ACT IN AERS: 2.0000 | INHALATION_SPRAY | Freq: Four times a day (QID) | RESPIRATORY_TRACT | 1 refills | Status: DC | PRN

## 2022-11-06 NOTE — Progress Notes (Signed)
$'@Patient'Y$  ID: Allison Dixon, female    DOB: 28-Jun-1961, 62 y.o.   MRN: [id number]  Chief Complaint  Patient presents with   Follow-up    Sleep Apnea    Referring provider: Valerie Roys, DO  HPI: 62 year old female, never smoked.  Past medical history significant for hypertension, dyspnea, obstructive sleep apnea. Patient of Dr. Halford Chessman, seen for initial consult on 01/25/2020.  She is on no current medication.  Previous [facility] pulmonary encounters: 8/31/2021Ok Edwards, NP Volanda Napoleon  Patient presents today to review sleep study results.  She originally saw Dr. Halford Chessman back in May 2021 with reports of snoring and daytime somnolence.  Patient completed split-night sleep study on 04/11/2020 at [facility] which showed moderate obstructive sleep apnea with AHI 16.3/hour and SPO2 low 75%.  Recommended CPAP therapy with pressure setting of 10 cm H2O.  We discussed sleep study results today and treatment options.  Agreeing to initiate CPAP therapy.  Continue to encourage weight loss and avoid driving if experiencing excessive somnolence.  Patient continues to experience some exertional dyspnea.  She had a normal chest x-ray in May.  Family asking for temporary handicap placard as walking long distance causes her to get out of breath.  Patient had PFTs at [facility] that showed no obvious obstructive airway disease or restrictive lung disease.  She has never smoked.  06/02/2021 Patient presents today for 1 year follow-up.  Split-night sleep study in August 2021 showed moderate obstructive sleep apnea AHI 16.3 an hour with SPO2 low 75%.  She was started on CPAP at 10 cm H2O. She is doing well, no acute complaints. She is 93% compliant with CPAP use > 4 hours. Feels she is not getting enough air with CPAP use. She uses full face mask. Her mouth gets dry at night. No significant change in daytime sleepiness. Epworth 4/24.   Airview download 04/30/21-05/29/21 Usage 30/30 days; 28 days (93%) > 4 hours Average usage 7 hours 26 mins Pressure  10cm h20 Airleaks 0.0L/min (95%) AHI 0.3   11/06/2022 - Interim hx Patient presents today for 1 year follow-up OSA. She reports improvement in sleep quality with CPAP use. No residual daytime sleepiness. She is 100% compliant with CPAP use >4 hours last 30 days. Current pressure 5-15cm h20; Residual AHI 3.3/hr. She feels she is not getting enough pressure at night. Mask is too small, she is working on getting a larger mask with [employer]. Additionally she has some shortness of breath symptoms and wheezing.  She does not smoke. No hx asthma. Experiences dyspnea with activities and stairs/inclines. No PFTs.  Airview download 10/06/22-10/09/26/24 Usage 30/30 days > 4 hours Average usage 7 hours 42 min Pressure 5-15cm h20 (11.2cm h20-95%) Airleaks 0.6L/min AHI 0.1    No Known Allergies  Immunization History  Administered Date(s) Administered   Td 07/29/2021    Past Medical History:  Diagnosis Date   Cancer ([place]) 2021   Uterine    COVID    Endometrial cancer ([place])    Hypertension    Morbid obesity ([place])    Sleep apnea     Tobacco History: Social History   Tobacco Use  Smoking Status Never  Smokeless Tobacco Never   Counseling given: Not Answered   Outpatient Medications Prior to Visit  Medication Sig Dispense Refill   amLODipine (NORVASC) 5 MG tablet Take 1 tablet (5 mg total) by mouth daily. Future refills will need to come from Primary Care. 180 tablet 0   diclofenac (CATAFLAM) 50 MG tablet Take 50  mg by mouth 2 (two) times daily as needed.     fluticasone (FLONASE) 50 MCG/ACT nasal spray Place 2 sprays into both nostrils daily.     ibuprofen (ADVIL) 800 MG tablet Take 1 tablet (800 mg total) by mouth every 8 (eight) hours as needed. 60 tablet 1   No facility-administered medications prior to visit.   Review of Systems  Review of Systems  Constitutional: Negative.   HENT: Negative.    Respiratory:  Positive for shortness of breath and wheezing.   Cardiovascular:  Negative.   Psychiatric/Behavioral:  Positive for sleep disturbance.      Physical Exam  BP 128/82 (BP Location: Left Wrist, Cuff Size: Normal)   Pulse 83   Ht '5\' 2"'$  (1.575 m)   Wt 241 lb 9.6 oz (109.6 kg)   LMP  (LMP Unknown)   SpO2 97%   BMI 44.19 kg/m  Physical Exam Constitutional:      Appearance: Normal appearance.  HENT:     Head: Normocephalic and atraumatic.  Cardiovascular:     Rate and Rhythm: Normal rate and regular rhythm.  Pulmonary:     Effort: Pulmonary effort is normal.     Breath sounds: Wheezing present.  Musculoskeletal:        General: Normal range of motion.  Skin:    General: Skin is warm and dry.  Neurological:     General: No focal deficit present.     Mental Status: She is alert and oriented to person, place, and time. Mental status is at baseline.  Psychiatric:        Mood and Affect: Mood normal.        Behavior: Behavior normal.        Thought Content: Thought content normal.        Judgment: Judgment normal.      Lab Results:  CBC    Component Value Date/Time   WBC 7.9 08/04/2022 0938   WBC 7.8 08/27/2020 1614   RBC 5.07 08/04/2022 0938   RBC 4.88 08/27/2020 1614   HGB 14.6 08/04/2022 0938   HCT 43.8 08/04/2022 0938   PLT 255 08/04/2022 0938   MCV 86 08/04/2022 0938   MCV 85 12/04/2013 2302   MCH 28.8 08/04/2022 0938   MCH 27.9 08/27/2020 1614   MCHC 33.3 08/04/2022 0938   MCHC 32.2 08/27/2020 1614   RDW 12.6 08/04/2022 0938   RDW 14.0 12/04/2013 2302   LYMPHSABS 1.1 08/04/2022 0938   MONOABS 0.8 08/27/2020 1614   EOSABS 0.1 08/04/2022 0938   BASOSABS 0.0 08/04/2022 0938    BMET    Component Value Date/Time   NA 143 08/04/2022 0938   NA 139 12/04/2013 2302   K 3.9 08/04/2022 0938   K 3.6 12/04/2013 2302   CL 105 08/04/2022 0938   CL 107 12/04/2013 2302   CO2 24 08/04/2022 0938   CO2 27 12/04/2013 2302   GLUCOSE 107 (H) 08/04/2022 0938   GLUCOSE 102 (H) 08/27/2020 1614   GLUCOSE 92 12/04/2013 2302   BUN 10  08/04/2022 0938   BUN 19 (H) 12/04/2013 2302   CREATININE 0.86 08/04/2022 0938   CREATININE 0.95 12/04/2013 2302   CALCIUM 9.6 08/04/2022 0938   CALCIUM 8.6 12/04/2013 2302   GFRNONAA >60 08/27/2020 1614   GFRNONAA >60 12/04/2013 2302   GFRAA >60 05/15/2020 1329   GFRAA >60 12/04/2013 2302    BNP    Component Value Date/Time   BNP 21.4 01/14/2021 0922    ProBNP  No results found for: "PROBNP"  Imaging: No results found.   Assessment & Plan:   Moderate obstructive sleep apnea - Moderate Sleep apnea is well controlled. She reports benefits from CPAP use. Patient feels pressure is not strong enough. Currently on auto settings 5-15cm h20; residual AHI 0.1/hr. We will change from auto settings to set pressure 12cm h20. Advised patient continue to wear CPAP every 4-6 hours and encourage weight loss efforts. Follow-up 6 months with Beth NP or sooner if needed   Dyspnea - Patient has dyspnea and wheezing symptoms with exertion. No hx asthma. Never smoked. Recommend checking PFTs. Sending in RX Albuterol HFA 2 puff every 4-6 hours for shortness of breath/wheezing.    Martyn Ehrich, NP 11/06/2022

## 2022-11-06 NOTE — Patient Instructions (Addendum)
Sleep apnea is well controlled on current pressure settings We will change from auto settings to set pressure 12cm h20 (previously 10cm h20)  Recommendations: Continue to wear CPAP nightly 4-6 hours Do not drive if tired Working on weight loss efforts  Avoid back sleeping position  Use Albuterol 2 puffs every 4-6 hours for shortness of breath/wheezing   Orders: Change CPAP pressure to 12cm h20  Pulmonary function testing [id number]   Follow-up: 6 months with [facility] NP or sooner if needed

## 2022-11-06 NOTE — Assessment & Plan Note (Addendum)
-   Moderate Sleep apnea is well controlled. She reports benefits from CPAP use. Patient feels pressure is not strong enough. Currently on auto settings 5-15cm h20; residual AHI 0.1/hr. We will change from auto settings to set pressure 12cm h20. Advised patient continue to wear CPAP every 4-6 hours and encourage weight loss efforts. Follow-up 6 months with [facility] NP or sooner if needed

## 2022-11-06 NOTE — Assessment & Plan Note (Signed)
-   Patient has dyspnea and wheezing symptoms with exertion. No hx asthma. Never smoked. Recommend checking PFTs. Sending in RX Albuterol HFA 2 puff every 4-6 hours for shortness of breath/wheezing.

## 2022-11-07 NOTE — Progress Notes (Signed)
Reviewed and agree with assessment/plan.   Chesley Mires, MD [facility] Pulmonary/Critical Care 11/07/2022, 1:15 PM Pager:  [phone]

## 2023-01-06 ENCOUNTER — Ambulatory Visit (INDEPENDENT_AMBULATORY_CARE_PROVIDER_SITE_OTHER): Payer: Medicare Other | Admitting: Family Medicine

## 2023-01-06 ENCOUNTER — Encounter: Payer: Self-pay | Admitting: Family Medicine

## 2023-01-06 VITALS — BP 125/80 | HR 73 | Temp 98.1°F | Ht 62.0 in | Wt 239.0 lb

## 2023-01-06 DIAGNOSIS — M5442 Lumbago with sciatica, left side: Secondary | ICD-10-CM

## 2023-01-06 DIAGNOSIS — R7303 Prediabetes: Secondary | ICD-10-CM

## 2023-01-06 DIAGNOSIS — I10 Essential (primary) hypertension: Secondary | ICD-10-CM

## 2023-01-06 DIAGNOSIS — G8929 Other chronic pain: Secondary | ICD-10-CM

## 2023-01-06 DIAGNOSIS — M5441 Lumbago with sciatica, right side: Secondary | ICD-10-CM | POA: Diagnosis not present

## 2023-01-06 LAB — URINALYSIS, ROUTINE W REFLEX MICROSCOPIC
Bilirubin, UA: NEGATIVE
Glucose, UA: NEGATIVE
Ketones, UA: NEGATIVE
Leukocytes,UA: NEGATIVE
Nitrite, UA: NEGATIVE
Protein,UA: NEGATIVE
RBC, UA: NEGATIVE
Specific Gravity, UA: 1.02 (ref 1.005–1.030)
Urobilinogen, Ur: 2 mg/dL — ABNORMAL HIGH (ref 0.2–1.0)
pH, UA: 7 (ref 5.0–7.5)

## 2023-01-06 LAB — MICROALBUMIN, URINE WAIVED
Creatinine, Urine Waived: 200 mg/dL (ref 10–300)
Microalb, Ur Waived: 30 mg/L — ABNORMAL HIGH (ref 0–19)
Microalb/Creat Ratio: <30 mg/g (ref <30)

## 2023-01-06 LAB — BAYER DCA HB A1C WAIVED: HB A1C (BAYER DCA - WAIVED): 5.4 % (ref 4.8–5.6)

## 2023-01-06 MED ORDER — AMLODIPINE BESYLATE 5 MG PO TABS: 5.0000 mg | ORAL_TABLET | Freq: Every day | ORAL | 1 refills | Status: DC

## 2023-01-06 MED ORDER — PREGABALIN 75 MG PO CAPS: ORAL_CAPSULE | ORAL | 1 refills | Status: DC

## 2023-01-06 NOTE — Assessment & Plan Note (Signed)
Under good control on current regimen. Continue current regimen. Continue to monitor. Call with any concerns. Refills given. Labs drawn today.   

## 2023-01-06 NOTE — Progress Notes (Signed)
BP 125/80   Pulse 73   Temp 98.1 F (36.7 C) (Oral)   Ht 5\' 2"  (1.575 m)   Wt 239 lb (108.4 kg)   LMP  (LMP Unknown)   SpO2 97%   BMI 43.71 kg/m    Subjective:    Patient ID: Allison Dixon, female    DOB: [birth year], 62 y.o.   MRN: [id number]  HPI: Allison Dixon is a 61 y.o. female  Chief Complaint  Patient presents with   Hypertension   Back Pain   Leg Pain    Patient says she is having issues with her L foot going numbness since having her surgery about 2 years.   Numbness    Patient says she is noticing numbness in her fingers and her L foot.    HYPERTENSION  Hypertension status: controlled  Satisfied with current treatment? yes Duration of hypertension: chronic BP monitoring frequency:  not checking BP medication side effects:  no Medication compliance: excellent compliance Previous BP meds:amlodipine Aspirin: no Recurrent headaches: no Visual changes: no Palpitations: no Dyspnea: no Chest pain: no Lower extremity edema: no Dizzy/lightheaded: no  Impaired Fasting Glucose HbA1C:  Lab Results  Component Value Date   HGBA1C 5.4 01/06/2023   Duration of elevated blood sugar: chronic Polydipsia: no Polyuria: no Weight change: no Visual disturbance: no Glucose Monitoring: no Diabetic Education: Not Completed Family history of diabetes: no  BACK PAIN- had been following with [facility], they wanted to do surgery and she was anxious about it Duration:  chronic Mechanism of injury: unknown Location: bilateral and low back Onset: gradual Severity: severe Quality: aching and tingling Frequency: constant Radiation: R leg below the knee and L leg below the knee Aggravating factors: nothing Alleviating factors: nothing Status: worse Treatments attempted:  injections, surgery, rest, ice, heat, APAP, ibuprofen, aleve, physical therapy, and HEP  Relief with NSAIDs?: no Nighttime pain:  no Paresthesias / decreased sensation:  yes Bowel / bladder  incontinence:  no Fevers:  no Dysuria / urinary frequency:  no  Relevant past medical, surgical, family and social history reviewed and updated as indicated. Interim medical history since our last visit reviewed. Allergies and medications reviewed and updated.  Review of Systems  Constitutional: Negative.   Respiratory: Negative.    Cardiovascular: Negative.   Gastrointestinal: Negative.   Musculoskeletal:  Positive for back pain and myalgias. Negative for arthralgias, gait problem, joint swelling, neck pain and neck stiffness.  Skin: Negative.   Neurological:  Positive for numbness. Negative for dizziness, tremors, seizures, syncope, facial asymmetry, speech difficulty, weakness, light-headedness and headaches.  Psychiatric/Behavioral: Negative.      Per HPI unless specifically indicated above     Objective:    BP 125/80   Pulse 73   Temp 98.1 F (36.7 C) (Oral)   Ht 5\' 2"  (1.575 m)   Wt 239 lb (108.4 kg)   LMP  (LMP Unknown)   SpO2 97%   BMI 43.71 kg/m   Wt Readings from Last 3 Encounters:  01/06/23 239 lb (108.4 kg)  11/06/22 241 lb 9.6 oz (109.6 kg)  08/04/22 242 lb 9.6 oz (110 kg)    Physical Exam Vitals and nursing note reviewed.  Constitutional:      General: She is not in acute distress.    Appearance: Normal appearance. She is obese. She is not ill-appearing, toxic-appearing or diaphoretic.  HENT:     Head: Normocephalic and atraumatic.     Right Ear: External ear normal.  Left Ear: External ear normal.     Nose: Nose normal.     Mouth/Throat:     Mouth: Mucous membranes are moist.     Pharynx: Oropharynx is clear.  Eyes:     General: No scleral icterus.       Right eye: No discharge.        Left eye: No discharge.     Extraocular Movements: Extraocular movements intact.     Conjunctiva/sclera: Conjunctivae normal.     Pupils: Pupils are equal, round, and reactive to light.  Cardiovascular:     Rate and Rhythm: Normal rate and regular rhythm.      Pulses: Normal pulses.     Heart sounds: Normal heart sounds. No murmur heard.    No friction rub. No gallop.  Pulmonary:     Effort: Pulmonary effort is normal. No respiratory distress.     Breath sounds: Normal breath sounds. No stridor. No wheezing, rhonchi or rales.  Chest:     Chest wall: No tenderness.  Musculoskeletal:        General: Normal range of motion.     Cervical back: Normal range of motion and neck supple.  Skin:    General: Skin is warm and dry.     Capillary Refill: Capillary refill takes less than 2 seconds.     Coloration: Skin is not jaundiced or pale.     Findings: No bruising, erythema, lesion or rash.  Neurological:     General: No focal deficit present.     Mental Status: She is alert and oriented to person, place, and time. Mental status is at baseline.  Psychiatric:        Mood and Affect: Mood normal.        Behavior: Behavior normal.        Thought Content: Thought content normal.        Judgment: Judgment normal.     Results for orders placed or performed in visit on 01/06/23  Bayer DCA Hb A1c Waived  Result Value Ref Range   HB A1C (BAYER DCA - WAIVED) 5.4 4.8 - 5.6 %  Microalbumin, Urine Waived  Result Value Ref Range   Microalb, Ur Waived 30 (H) 0 - 19 mg/L   Creatinine, Urine Waived 200 10 - 300 mg/dL   Microalb/Creat Ratio <30 <30 mg/g  Urinalysis, Routine w reflex microscopic  Result Value Ref Range   Specific Gravity, UA 1.020 1.005 - 1.030   pH, UA 7.0 5.0 - 7.5   Color, UA Yellow Yellow   Appearance Ur Cloudy (A) Clear   Leukocytes,UA Negative Negative   Protein,UA Negative Negative/Trace   Glucose, UA Negative Negative   Ketones, UA Negative Negative   RBC, UA Negative Negative   Bilirubin, UA Negative Negative   Urobilinogen, Ur 2.0 (H) 0.2 - 1.0 mg/dL   Nitrite, UA Negative Negative   Microscopic Examination Comment       Assessment & Plan:   Problem List Items Addressed This Visit       Cardiovascular and  Mediastinum   Primary hypertension    Under good control on current regimen. Continue current regimen. Continue to monitor. Call with any concerns. Refills given. Labs drawn today.       Relevant Medications   amLODipine (NORVASC) 5 MG tablet   Other Relevant Orders   Microalbumin, Urine Waived (Completed)   CBC with Differential/Platelet   Comprehensive metabolic panel   Urinalysis, Routine w reflex microscopic (Completed)   TSH  Nervous and Auditory   Chronic bilateral low back pain with bilateral sciatica    Continue to follow with neurosurgery. Will start her on lyrica. Recheck 1 month. Call with any concerns.       Relevant Medications   pregabalin (LYRICA) 75 MG capsule     Other   Prediabetes - Primary    Rechecking labs today. Await results. Treat as needed.       Relevant Orders   Bayer DCA Hb A1c Waived (Completed)   CBC with Differential/Platelet   Comprehensive metabolic panel   Lipid Panel w/o Chol/HDL Ratio     Follow up plan: Return in about 4 weeks (around 02/03/2023).

## 2023-01-06 NOTE — Assessment & Plan Note (Signed)
Continue to follow with neurosurgery. Will start her on lyrica. Recheck 1 month. Call with any concerns.

## 2023-01-06 NOTE — Assessment & Plan Note (Signed)
Rechecking labs today. Await results. Treat as needed.  °

## 2023-01-07 LAB — CBC WITH DIFFERENTIAL/PLATELET
Basophils Absolute: 0 10*3/uL (ref 0.0–0.2)
Basos: 1 %
EOS (ABSOLUTE): 0.1 10*3/uL (ref 0.0–0.4)
Eos: 3 %
Hematocrit: 41.3 % (ref 34.0–46.6)
Hemoglobin: 13.6 g/dL (ref 11.1–15.9)
Immature Grans (Abs): 0 10*3/uL (ref 0.0–0.1)
Immature Granulocytes: 0 %
Lymphocytes Absolute: 1.8 10*3/uL (ref 0.7–3.1)
Lymphs: 35 %
MCH: 29.1 pg (ref 26.6–33.0)
MCHC: 32.9 g/dL (ref 31.5–35.7)
MCV: 88 fL (ref 79–97)
Monocytes Absolute: 0.4 10*3/uL (ref 0.1–0.9)
Monocytes: 7 %
Neutrophils Absolute: 2.8 10*3/uL (ref 1.4–7.0)
Neutrophils: 54 %
Platelets: 259 10*3/uL (ref 150–450)
RBC: 4.67 x10E6/uL (ref 3.77–5.28)
RDW: 13.1 % (ref 11.7–15.4)
WBC: 5.2 10*3/uL (ref 3.4–10.8)

## 2023-01-07 LAB — COMPREHENSIVE METABOLIC PANEL
ALT: 8 IU/L (ref 0–32)
AST: 12 IU/L (ref 0–40)
Albumin/Globulin Ratio: 1.1 — ABNORMAL LOW (ref 1.2–2.2)
Albumin: 3.9 g/dL (ref 3.8–4.9)
Alkaline Phosphatase: 81 IU/L (ref 44–121)
BUN/Creatinine Ratio: 16 (ref 9–23)
BUN: 12 mg/dL (ref 6–24)
Bilirubin Total: 0.7 mg/dL (ref 0.0–1.2)
CO2: 24 mmol/L (ref 20–29)
Calcium: 9.3 mg/dL (ref 8.7–10.2)
Chloride: 107 mmol/L — ABNORMAL HIGH (ref 96–106)
Creatinine, Ser: 0.76 mg/dL (ref 0.57–1.00)
Globulin, Total: 3.4 g/dL (ref 1.5–4.5)
Glucose: 101 mg/dL — ABNORMAL HIGH (ref 70–99)
Potassium: 3.9 mmol/L (ref 3.5–5.2)
Sodium: 143 mmol/L (ref 134–144)
Total Protein: 7.3 g/dL (ref 6.0–8.5)
eGFR: 90 mL/min/{1.73_m2} (ref >59)

## 2023-01-07 LAB — LIPID PANEL W/O CHOL/HDL RATIO
Cholesterol, Total: 179 mg/dL (ref 100–199)
HDL: 80 mg/dL (ref >39)
LDL Chol Calc (NIH): 87 mg/dL (ref 0–99)
Triglycerides: 62 mg/dL (ref 0–149)
VLDL Cholesterol Cal: 12 mg/dL (ref 5–40)

## 2023-01-07 LAB — TSH: TSH: 1.1 u[IU]/mL (ref 0.450–4.500)

## 2023-04-09 ENCOUNTER — Ambulatory Visit (INDEPENDENT_AMBULATORY_CARE_PROVIDER_SITE_OTHER): Payer: Medicare Other | Admitting: Family Medicine

## 2023-04-09 ENCOUNTER — Other Ambulatory Visit (HOSPITAL_COMMUNITY)
Admission: RE | Admit: 2023-04-09 | Discharge: 2023-04-09 | Disposition: A | Discharge status: Home / Self Care | Payer: Medicare Other | Source: Ambulatory Visit | Attending: Family Medicine | Admitting: Family Medicine

## 2023-04-09 ENCOUNTER — Encounter: Payer: Self-pay | Admitting: Family Medicine

## 2023-04-09 VITALS — BP 162/82 | HR 65 | Temp 97.6°F | Ht 60.63 in | Wt 241.0 lb

## 2023-04-09 DIAGNOSIS — Z124 Encounter for screening for malignant neoplasm of cervix: Secondary | ICD-10-CM

## 2023-04-09 DIAGNOSIS — M5442 Lumbago with sciatica, left side: Secondary | ICD-10-CM | POA: Diagnosis not present

## 2023-04-09 DIAGNOSIS — I10 Essential (primary) hypertension: Secondary | ICD-10-CM | POA: Diagnosis not present

## 2023-04-09 DIAGNOSIS — Z01419 Encounter for gynecological examination (general) (routine) without abnormal findings: Secondary | ICD-10-CM | POA: Diagnosis present

## 2023-04-09 DIAGNOSIS — Z1231 Encounter for screening mammogram for malignant neoplasm of breast: Secondary | ICD-10-CM | POA: Diagnosis not present

## 2023-04-09 DIAGNOSIS — Z1151 Encounter for screening for human papillomavirus (HPV): Secondary | ICD-10-CM | POA: Insufficient documentation

## 2023-04-09 DIAGNOSIS — G8929 Other chronic pain: Secondary | ICD-10-CM

## 2023-04-09 DIAGNOSIS — M5441 Lumbago with sciatica, right side: Secondary | ICD-10-CM

## 2023-04-09 MED ORDER — PREGABALIN 150 MG PO CAPS: 150.0000 mg | ORAL_CAPSULE | Freq: Two times a day (BID) | ORAL | 2 refills | Status: DC

## 2023-04-09 NOTE — Progress Notes (Signed)
BP (!) 162/82   Pulse 65   Temp 97.6 F (36.4 C) (Oral)   Ht 5' 0.63" (1.54 m)   Wt 241 lb (109.3 kg)   LMP  (LMP Unknown)   SpO2 98%   BMI 46.09 kg/m    Subjective:    Patient ID: Allison Dixon, female    DOB: 1960/12/12, 62 y.o.   MRN: [id number]  HPI: Allison Dixon is a 62 y.o. female  Chief Complaint  Patient presents with   Back Pain   Annual Exam   HYPERTENSION-  missed her dose of amlodipine today, has not been taking her medicine regularly Hypertension status: uncontrolled  Satisfied with current treatment? yes Duration of hypertension: chronic BP monitoring frequency:  rarely BP medication side effects:  no Medication compliance: poor compliance Previous BP meds:amodipine Aspirin: no Recurrent headaches: no Visual changes: no Palpitations: no Dyspnea: yes Chest pain: yes Lower extremity edema: no Dizzy/lightheaded: no  BACK PAIN- feeling better, but having fatigue on the lyrica, but feels like it helps so would like to continue it Duration: chronic Mechanism of injury: unknown Location: bilateral low back Onset: gradual Severity: moderate to severe Quality: aching and tingling Frequency: constant Radiation: R leg below the knee and L leg below the knee Aggravating factors: nothing Alleviating factors: lyrica Status: better Treatments attempted:injections, surgery, rest, ice, heat, APAP, ibuprofen, and aleve  Relief with NSAIDs?: no Nighttime pain:  no Paresthesias / decreased sensation:  yes Bowel / bladder incontinence:  no Fevers:  no Dysuria / urinary frequency:  no   Relevant past medical, surgical, family and social history reviewed and updated as indicated. Interim medical history since our last visit reviewed. Allergies and medications reviewed and updated.  Review of Systems  Constitutional: Negative.   HENT: Negative.    Eyes: Negative.        Blurred vision  Respiratory:  Positive for shortness of breath. Negative for apnea,  cough, choking, chest tightness, wheezing and stridor.   Cardiovascular:  Positive for leg swelling. Negative for chest pain and palpitations.  Gastrointestinal: Negative.   Endocrine: Positive for heat intolerance. Negative for cold intolerance, polydipsia, polyphagia and polyuria.  Genitourinary: Negative.   Musculoskeletal:  Positive for back pain. Negative for arthralgias, gait problem, joint swelling, myalgias, neck pain and neck stiffness.  Skin:  Positive for rash. Negative for color change, pallor and wound.  Allergic/Immunologic: Negative.   Neurological:  Positive for numbness. Negative for dizziness, tremors, seizures, syncope, facial asymmetry, speech difficulty, weakness, light-headedness and headaches.  Hematological:  Negative for adenopathy. Bruises/bleeds easily.  Psychiatric/Behavioral: Negative.      Per HPI unless specifically indicated above     Objective:    BP (!) 162/82   Pulse 65   Temp 97.6 F (36.4 C) (Oral)   Ht 5' 0.63" (1.54 m)   Wt 241 lb (109.3 kg)   LMP  (LMP Unknown)   SpO2 98%   BMI 46.09 kg/m   Wt Readings from Last 3 Encounters:  04/09/23 241 lb (109.3 kg)  01/06/23 239 lb (108.4 kg)  11/06/22 241 lb 9.6 oz (109.6 kg)    Physical Exam Vitals and nursing note reviewed. Exam conducted with a chaperone present.  Constitutional:      General: She is not in acute distress.    Appearance: Normal appearance. She is obese. She is not ill-appearing, toxic-appearing or diaphoretic.  HENT:     Head: Normocephalic and atraumatic.     Right Ear: External ear normal.  Left Ear: External ear normal.     Nose: Nose normal.     Mouth/Throat:     Mouth: Mucous membranes are moist.     Pharynx: Oropharynx is clear.  Eyes:     General: No scleral icterus.       Right eye: No discharge.        Left eye: No discharge.     Extraocular Movements: Extraocular movements intact.     Conjunctiva/sclera: Conjunctivae normal.     Pupils: Pupils are equal,  round, and reactive to light.  Cardiovascular:     Rate and Rhythm: Normal rate and regular rhythm.     Pulses: Normal pulses.     Heart sounds: Normal heart sounds. No murmur heard.    No friction rub. No gallop.  Pulmonary:     Effort: Pulmonary effort is normal. No respiratory distress.     Breath sounds: Normal breath sounds. No stridor. No wheezing, rhonchi or rales.  Chest:     Chest wall: No tenderness.  Genitourinary:    Labia:        Right: No rash, tenderness, lesion or injury.        Left: No rash, tenderness, lesion or injury.      Vagina: Normal.     Uterus: Absent.      Comments: Cervix absent Musculoskeletal:        General: Normal range of motion.     Cervical back: Normal range of motion and neck supple.  Skin:    General: Skin is warm and dry.     Capillary Refill: Capillary refill takes less than 2 seconds.     Coloration: Skin is not jaundiced or pale.     Findings: No bruising, erythema, lesion or rash.  Neurological:     General: No focal deficit present.     Mental Status: She is alert and oriented to person, place, and time. Mental status is at baseline.  Psychiatric:        Mood and Affect: Mood normal.        Behavior: Behavior normal.        Thought Content: Thought content normal.        Judgment: Judgment normal.     Results for orders placed or performed in visit on 01/06/23  Bayer DCA Hb A1c Waived  Result Value Ref Range   HB A1C (BAYER DCA - WAIVED) 5.4 4.8 - 5.6 %  Microalbumin, Urine Waived  Result Value Ref Range   Microalb, Ur Waived 30 (H) 0 - 19 mg/L   Creatinine, Urine Waived 200 10 - 300 mg/dL   Microalb/Creat Ratio <30 <30 mg/g  CBC with Differential/Platelet  Result Value Ref Range   WBC 5.2 3.4 - 10.8 x10E3/uL   RBC 4.67 3.77 - 5.28 x10E6/uL   Hemoglobin 13.6 11.1 - 15.9 g/dL   Hematocrit 16.1 09.6 - 46.6 %   MCV 88 79 - 97 fL   MCH 29.1 26.6 - 33.0 pg   MCHC 32.9 31.5 - 35.7 g/dL   RDW 04.5 40.9 - 81.1 %   Platelets  259 150 - 450 x10E3/uL   Neutrophils 54 Not Estab. %   Lymphs 35 Not Estab. %   Monocytes 7 Not Estab. %   Eos 3 Not Estab. %   Basos 1 Not Estab. %   Neutrophils Absolute 2.8 1.4 - 7.0 x10E3/uL   Lymphocytes Absolute 1.8 0.7 - 3.1 x10E3/uL   Monocytes Absolute 0.4 0.1 - 0.9 x10E3/uL  EOS (ABSOLUTE) 0.1 0.0 - 0.4 x10E3/uL   Basophils Absolute 0.0 0.0 - 0.2 x10E3/uL   Immature Granulocytes 0 Not Estab. %   Immature Grans (Abs) 0.0 0.0 - 0.1 x10E3/uL  Comprehensive metabolic panel  Result Value Ref Range   Glucose 101 (H) 70 - 99 mg/dL   BUN 12 6 - 24 mg/dL   Creatinine, Ser 2.72 0.57 - 1.00 mg/dL   eGFR 90 >53 GU/YQI/3.47   BUN/Creatinine Ratio 16 9 - 23   Sodium 143 134 - 144 mmol/L   Potassium 3.9 3.5 - 5.2 mmol/L   Chloride 107 (H) 96 - 106 mmol/L   CO2 24 20 - 29 mmol/L   Calcium 9.3 8.7 - 10.2 mg/dL   Total Protein 7.3 6.0 - 8.5 g/dL   Albumin 3.9 3.8 - 4.9 g/dL   Globulin, Total 3.4 1.5 - 4.5 g/dL   Albumin/Globulin Ratio 1.1 (L) 1.2 - 2.2   Bilirubin Total 0.7 0.0 - 1.2 mg/dL   Alkaline Phosphatase 81 44 - 121 IU/L   AST 12 0 - 40 IU/L   ALT 8 0 - 32 IU/L  Lipid Panel w/o Chol/HDL Ratio  Result Value Ref Range   Cholesterol, Total 179 100 - 199 mg/dL   Triglycerides 62 0 - 149 mg/dL   HDL 80 >42 mg/dL   VLDL Cholesterol Cal 12 5 - 40 mg/dL   LDL Chol Calc (NIH) 87 0 - 99 mg/dL  Urinalysis, Routine w reflex microscopic  Result Value Ref Range   Specific Gravity, UA 1.020 1.005 - 1.030   pH, UA 7.0 5.0 - 7.5   Color, UA Yellow Yellow   Appearance Ur Cloudy (A) Clear   Leukocytes,UA Negative Negative   Protein,UA Negative Negative/Trace   Glucose, UA Negative Negative   Ketones, UA Negative Negative   RBC, UA Negative Negative   Bilirubin, UA Negative Negative   Urobilinogen, Ur 2.0 (H) 0.2 - 1.0 mg/dL   Nitrite, UA Negative Negative   Microscopic Examination Comment   TSH  Result Value Ref Range   TSH 1.100 0.450 - 4.500 uIU/mL      Assessment & Plan:    Problem List Items Addressed This Visit       Cardiovascular and Mediastinum   Primary hypertension    Not taking her medicine. Encouraged her to take her medicine regularly. Recheck 1 month.         Nervous and Auditory   Chronic bilateral low back pain with bilateral sciatica - Primary    Having some fatigue from her lyrica. Will continue her on same dose to give her body time to adjust. Recheck about a month. Call with any concerns.       Relevant Medications   pregabalin (LYRICA) 150 MG capsule   Other Visit Diagnoses     Encounter for screening mammogram for malignant neoplasm of breast       Mammogram ordered and scheduled today.   Relevant Orders   MM 3D SCREENING MAMMOGRAM BILATERAL BREAST   Screening for cervical cancer       Pap done today. Await results.   Relevant Orders   Cytology - PAP        Follow up plan: Return in about 4 weeks (around 05/07/2023).

## 2023-04-09 NOTE — Assessment & Plan Note (Signed)
Not taking her medicine. Encouraged her to take her medicine regularly. Recheck 1 month.

## 2023-04-09 NOTE — Assessment & Plan Note (Signed)
Having some fatigue from her lyrica. Will continue her on same dose to give her body time to adjust. Recheck about a month. Call with any concerns.

## 2023-04-09 NOTE — Patient Instructions (Signed)
Your mammogram is scheduled for 04/21/23 at 2:20 pm at [facility] at [facility]  Address: [address], [place], [place] [address] Phone: [phone]

## 2023-04-21 ENCOUNTER — Ambulatory Visit
Admission: RE | Admit: 2023-04-21 | Discharge: 2023-04-21 | Disposition: A | Discharge status: Home / Self Care | Payer: Medicare Other | Source: Ambulatory Visit | Attending: Family Medicine | Admitting: Family Medicine

## 2023-04-21 DIAGNOSIS — Z1231 Encounter for screening mammogram for malignant neoplasm of breast: Secondary | ICD-10-CM | POA: Insufficient documentation

## 2023-05-21 ENCOUNTER — Encounter: Payer: Self-pay | Admitting: Pharmacist

## 2023-05-25 ENCOUNTER — Ambulatory Visit (INDEPENDENT_AMBULATORY_CARE_PROVIDER_SITE_OTHER): Payer: Medicare Other | Admitting: Family Medicine

## 2023-05-25 ENCOUNTER — Encounter: Payer: Self-pay | Admitting: Family Medicine

## 2023-05-25 VITALS — BP 102/65 | HR 60 | Temp 98.3°F | Wt 235.8 lb

## 2023-05-25 DIAGNOSIS — M5442 Lumbago with sciatica, left side: Secondary | ICD-10-CM

## 2023-05-25 DIAGNOSIS — G8929 Other chronic pain: Secondary | ICD-10-CM | POA: Diagnosis not present

## 2023-05-25 DIAGNOSIS — I10 Essential (primary) hypertension: Secondary | ICD-10-CM

## 2023-05-25 DIAGNOSIS — M5441 Lumbago with sciatica, right side: Secondary | ICD-10-CM

## 2023-05-25 MED ORDER — PREGABALIN 200 MG PO CAPS: 200.0000 mg | ORAL_CAPSULE | Freq: Two times a day (BID) | ORAL | 3 refills | Status: DC

## 2023-05-25 MED ORDER — OMEPRAZOLE 20 MG PO CPDR: 20.0000 mg | DELAYED_RELEASE_CAPSULE | Freq: Every day | ORAL | 3 refills | Status: DC

## 2023-05-25 NOTE — Progress Notes (Unsigned)
BP 102/65   Pulse 60   Temp 98.3 F (36.8 C) (Oral)   Wt 235 lb 12.8 oz (107 kg)   LMP  (LMP Unknown)   SpO2 97%   BMI 45.10 kg/m    Subjective:    Patient ID: Allison Dixon, female    DOB: December 14, 1960, 62 y.o.   MRN: [id number]  HPI: Allison Dixon is a 62 y.o. female  Chief Complaint  Patient presents with   Hypertension   HYPERTENSION  Hypertension status: controlled  Satisfied with current treatment? yes Duration of hypertension: chronic BP monitoring frequency:  not checking BP medication side effects:  no Medication compliance: excellent compliance Previous BP meds: amlodipine Aspirin: no Recurrent headaches: no Visual changes: no Palpitations: no Dyspnea: no Chest pain: no Lower extremity edema: no Dizzy/lightheaded: no  BACK PAIN Duration: chronic Mechanism of injury: unknown Location: low back bilaterally Onset: gradual Severity: moderate Quality: aching and tingling Frequency: constant Radiation: both legs below the knees Aggravating factors: nothing Alleviating factors: lyrica Status: better Treatments attempted: injections, surgery, rest, ice, heat, tylenol, ibuprofen, aleve, PT  Relief with NSAIDs?: no Nighttime pain:  no Paresthesias / decreased sensation:  yes Bowel / bladder incontinence:  no Fevers:  no Dysuria / urinary frequency:  no  Relevant past medical, surgical, family and social history reviewed and updated as indicated. Interim medical history since our last visit reviewed. Allergies and medications reviewed and updated.  Review of Systems  Constitutional: Negative.   Respiratory: Negative.    Cardiovascular: Negative.   Gastrointestinal: Negative.   Musculoskeletal:  Positive for back pain and myalgias. Negative for arthralgias, gait problem, joint swelling, neck pain and neck stiffness.  Skin: Negative.   Psychiatric/Behavioral: Negative.      Per HPI unless specifically indicated above     Objective:    BP 102/65    Pulse 60   Temp 98.3 F (36.8 C) (Oral)   Wt 235 lb 12.8 oz (107 kg)   LMP  (LMP Unknown)   SpO2 97%   BMI 45.10 kg/m   Wt Readings from Last 3 Encounters:  05/25/23 235 lb 12.8 oz (107 kg)  04/09/23 241 lb (109.3 kg)  01/06/23 239 lb (108.4 kg)    Physical Exam Vitals and nursing note reviewed.  Constitutional:      General: She is not in acute distress.    Appearance: Normal appearance. She is obese. She is not ill-appearing, toxic-appearing or diaphoretic.  HENT:     Head: Normocephalic and atraumatic.     Right Ear: External ear normal.     Left Ear: External ear normal.     Nose: Nose normal.     Mouth/Throat:     Mouth: Mucous membranes are moist.     Pharynx: Oropharynx is clear.  Eyes:     General: No scleral icterus.       Right eye: No discharge.        Left eye: No discharge.     Extraocular Movements: Extraocular movements intact.     Conjunctiva/sclera: Conjunctivae normal.     Pupils: Pupils are equal, round, and reactive to light.  Cardiovascular:     Rate and Rhythm: Normal rate and regular rhythm.     Pulses: Normal pulses.     Heart sounds: Normal heart sounds. No murmur heard.    No friction rub. No gallop.  Pulmonary:     Effort: Pulmonary effort is normal. No respiratory distress.     Breath sounds:  Normal breath sounds. No stridor. No wheezing, rhonchi or rales.  Chest:     Chest wall: No tenderness.  Musculoskeletal:        General: Normal range of motion.     Cervical back: Normal range of motion and neck supple.  Skin:    General: Skin is warm and dry.     Capillary Refill: Capillary refill takes less than 2 seconds.     Coloration: Skin is not jaundiced or pale.     Findings: No bruising, erythema, lesion or rash.  Neurological:     General: No focal deficit present.     Mental Status: She is alert and oriented to person, place, and time. Mental status is at baseline.  Psychiatric:        Mood and Affect: Mood normal.         Behavior: Behavior normal.        Thought Content: Thought content normal.        Judgment: Judgment normal.     Results for orders placed or performed in visit on 04/09/23  Cytology - PAP  Result Value Ref Range   High risk HPV Negative    Adequacy      Satisfactory for evaluation; transformation zone component ABSENT.   Diagnosis      - Negative for intraepithelial lesion or malignancy (NILM)   Comment Normal Reference Range HPV - Negative       Assessment & Plan:   Problem List Items Addressed This Visit       Cardiovascular and Mediastinum   Primary hypertension - Primary    Under good control on current regimen. Continue current regimen. Continue to monitor. Call with any concerns. Refills up to date.          Nervous and Auditory   Chronic bilateral low back pain with bilateral sciatica    Improved on the lyrica, but continues to have fatigue on the lyrica- will increase her to 200mg  to see how she does and recheck in about 4-6 weeks. Call with any concerns.       Relevant Medications   pregabalin (LYRICA) 200 MG capsule     Follow up plan: Return 4-6 weeks.

## 2023-05-27 ENCOUNTER — Encounter: Payer: Self-pay | Admitting: Family Medicine

## 2023-05-27 NOTE — Assessment & Plan Note (Signed)
Improved on the lyrica, but continues to have fatigue on the lyrica- will increase her to 200mg  to see how she does and recheck in about 4-6 weeks. Call with any concerns.

## 2023-05-27 NOTE — Assessment & Plan Note (Signed)
Under good control on current regimen. Continue current regimen. Continue to monitor. Call with any concerns. Refills up to date.

## 2023-06-10 ENCOUNTER — Telehealth: Payer: Self-pay | Admitting: Pharmacist

## 2023-06-10 NOTE — Telephone Encounter (Signed)
Called patient to review medication adherence to BP medications. I was unable to reach the patient so I left a HIPAA-compliant message requesting that the patient return my call.   Butch Penny, PharmD, Patsy [place], CPP Clinical Pharmacist [facility] [phone]

## 2023-06-23 ENCOUNTER — Inpatient Hospital Stay: Payer: Medicare Other | Attending: Hematology and Oncology | Admitting: Obstetrics and Gynecology

## 2023-06-23 VITALS — BP 153/78 | HR 64 | Temp 96.7°F | Resp 20 | Wt 237.8 lb

## 2023-06-23 DIAGNOSIS — Z8542 Personal history of malignant neoplasm of other parts of uterus: Secondary | ICD-10-CM | POA: Insufficient documentation

## 2023-06-23 DIAGNOSIS — Z90711 Acquired absence of uterus with remaining cervical stump: Secondary | ICD-10-CM | POA: Diagnosis not present

## 2023-06-23 DIAGNOSIS — Z90722 Acquired absence of ovaries, bilateral: Secondary | ICD-10-CM

## 2023-06-23 DIAGNOSIS — Z90710 Acquired absence of both cervix and uterus: Secondary | ICD-10-CM | POA: Insufficient documentation

## 2023-06-23 NOTE — Patient Instructions (Signed)
Please contact Dr Cherry's office at [phone] to schedule appointment in 6 months. It was a pleasure seeing you today and thank you for allowing me to participate in your care. -Consuello Masse, NP

## 2023-06-23 NOTE — Progress Notes (Signed)
Gynecologic Oncology Interval Visit   Referring Provider: Gunnar Bulla, CNM [address] [place],  [place] [address] [phone]  Chief Concern: endometrial cancer  Subjective:  Allison Dixon is a 62 y.o. G57P7 female, diagnosed with stage IA grade I endometrial cancer s/p TLH BSO with bilateral pelvic SLN mapping (no biopsies) and right periurethral biopsy (negative) on 05/22/20, who returns to clinic for follow-up.   She was diagnosed with stage IA grade 1 endometrial cancer.  She underwent TLH BSO with bilateral pelvic SLN mapping and right periurethral biopsy with Dr. Johnnette Litter and Dr. Valentino Saxon on 05/22/20 with invasion 5/25 mm and negative LVSI, adnexa, cervix and washings. SLN not biopsied due to no mapping because of morbid obesity. Periurethral biopsy negative. Cancer has loss of MLH1 and PMS2 and MLH1 promoter methylation assay positive.   She has not seen Dr Valentino Saxon for surveillance in the interim. No new gyn complaints. Has some back pain with sciatica on the left and has been advised to have surgery but is not sure if she wants to.    Gynecologic Oncology: Initially seen in consultation from Serafina Royals for consultation for endometrial cancer. She presented with abnormal bleeding, irregular x 6-7 months. Her evaluation included the following tests:   Pelvic US 04/18/2020 The uterus is anteverted and measures 10.2 x 5.4. Echo texture is heterogenous with evidence of focal masses. Within the uterus are multiple suspected fibroids measuring: Fibroid 1:Posterior IM 2.6 x 3.1 x 3.0 cm Fibroid 2:Anterior IM 2.2 x 2.2 x 1.8 cm The Endometrium measures 14 mm. Right Ovary was not seen Left Ovary was not seen Survey of the adnexa demonstrates no adnexal masses. There is no free fluid in the cul de sac.  Endometrial biopsy 04/18/2020  A. 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   DIAGNOSIS:  A.  UTERUS WITH CERVIX, BILATERAL FALLOPIAN TUBES AND OVARIES; TOTAL  HYSTERECTOMY WITH BILATERAL SALPINGO-OOPHORECTOMY:  - ENDOMETRIAL CARCINOMA.  - SEE CANCER STAGING SUMMARY BELOW.  - INACTIVE BACKGROUND ENDOMETRIUM WITH 2.3 CM ENDOMETRIAL POLYP WITH  ENDOMETRIAL INTRAEPITHELIAL NEOPLASIA.  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  - MYOMETRIUM WITH ADENOMYOSIS.  - BILATERAL OVARIES, NEGATIVE FOR MALIGNANCY.  - BILATERAL FALLOPIAN TUBES, NEGATIVE FOR MALIGNANCY.   B. PERI URETHRA, RIGHT; BIOPSY:  - LENTIGO SIMPLEX.  - HYPERKERATOSIS.  - NEGATIVE FOR DYSPLASIA AND MALIGNANCY.  - DEEPER SECTIONS AND S100 STAIN EXAMINED.   CANCER CASE SUMMARY: ENDOMETRIUM  Procedure: Total hysterectomy and bilateral salpingo-oophorectomy  Tumor Size: Greatest dimension: 4 cm  Histologic Type: Endometrioid carcinoma  Histologic Grade: FIGO grade 1  Myometrial Invasion: Present       Depth of invasion: 5 mm       Myometrial thickness: 25 mm       Percentage of myometrial invasion: 20%  Uterine Serosa Involvement: Not identified  Cervical Stromal Involvement: Not identified  Other Tissue/Organ Involvement: Not applicable  Lymphovascular Invasion: Not identified  Regional Lymph Nodes: No lymph nodes submitted or found  Pathologic Stage Classification (pTNM, AJCC 8th Edition): pT1a pN not  assigned / FIGO IA   Immunohistochemistry (IHC) Testing for DNA Mismatch Repair (MMR)  Proteins:  Results:  MLH1: Loss of protein expression  MSH2: Intact nuclear expression  MSH6: Intact nuclear expression, heterogenous pattern  PMS2: Loss of protein expression  IHC Interpretation: Abnormal result with absent nuclear staining for MLH1 and PMS2, high probability of MSI-H.  This pattern of deficient DNA repair proteins is most often associated with methylation of the MLH1 gene promoter.  Testing for MLH1 promoter methylation has been requested and the result will be reported in an addendum. Of note, a heterogeneous pattern of MSH-6 staining typically does not indicate germline mutation in MSH6 but is commonly associated with abnormality in another MMR gene such as MLH1 or PMS2, or even other DNA repair genes such as DNA polymerase.  MLH1 promoter methylation positive  Problem List: Patient Active Problem List   Diagnosis Date Noted   Dysphagia    Cardiac murmur 01/27/2022   Hematuria 10/14/2021   History of endometrial cancer 07/29/2021   Primary hypertension 01/14/2021   Prediabetes 01/14/2021   Chronic bilateral low back pain with bilateral sciatica 01/14/2021   Morbid obesity (HCC) 01/14/2021   S/P laparoscopic hysterectomy 05/30/2020   Moderate obstructive sleep apnea 05/07/2020  Ectopic Pregnancy  Past Medical History: Past Medical History:  Diagnosis Date   Cancer (HCC) 2021   Uterine    COVID    Endometrial cancer (HCC)    Hypertension    Morbid obesity (HCC)    Sleep apnea     Past Surgical History: Past Surgical History:  Procedure Laterality Date   DILATION AND CURETTAGE, DIAGNOSTIC / THERAPEUTIC     ESOPHAGOGASTRODUODENOSCOPY (EGD) WITH PROPOFOL N/A 02/24/2022   Procedure: ESOPHAGOGASTRODUODENOSCOPY (EGD) WITH PROPOFOL;  Surgeon: Midge Minium, MD;  Location: [facility];  Service: Endoscopy;  Laterality: N/A;   EXPLORATORY LAPAROTOMY     for ectopic pregnancy   FOOT SURGERY Left    TOTAL LAPAROSCOPIC HYSTERECTOMY WITH BILATERAL SALPINGO OOPHORECTOMY Bilateral 05/22/2020   Procedure: TOTAL LAPAROSCOPIC HYSTERECTOMY WITH BILATERAL SALPINGO OOPHORECTOMY AND WASHINGS, PERINEAL BIOPSY;  Surgeon: Leida Lauth, MD;  Location: [facility] ORS;  Service: Gynecology;  Laterality: Bilateral;    Past Gynecologic History:  As per HPI  OB History:  SVD x 7 OB History  Gravida Para Term Preterm AB Living  7 7 0 0 0 0  SAB IAB Ectopic Multiple Live Births   0 0 0 0 0    # Outcome Date GA Lbr Len/2nd Weight Sex Type Anes PTL Lv  7 Para           6 Para           5 Para           4 Para           3 Para           2 Para           1 Para             Obstetric Comments  NSVD x 7    Family History: Family History  Problem Relation Age of Onset   Heart attack Mother    Cancer Mother    Cancer Father    Breast cancer Neg Hx     Social History: Social History   Socioeconomic History   Marital status: Widowed    Spouse name: Not on file   Number of children: Not on file   Years of education: Not on file   Highest education level: 10th grade  Occupational History   Not on file  Tobacco Use   Smoking status: Never   Smokeless tobacco: Never  Vaping Use   Vaping status: Never Used  Substance and Sexual Activity  Alcohol use: Never   Drug use: Never   Sexual activity: Not Currently    Birth control/protection: Surgical  Other Topics Concern   Not on file  Social History Narrative   Not on file   Social Determinants of Health   Financial Resource Strain: Low Risk  (05/24/2023)   Overall Financial Resource Strain (CARDIA)    Difficulty of Paying Living Expenses: Not very hard  Food Insecurity: Food Insecurity Present (05/24/2023)   Hunger Vital Sign    Worried About Running Out of Food in the Last Year: Sometimes true    Ran Out of Food in the Last Year: Sometimes true  Transportation Needs: No Transportation Needs (05/24/2023)   PRAPARE - Administrator, Civil Service (Medical): No    Lack of Transportation (Non-Medical): No  Physical Activity: Unknown (05/24/2023)   Exercise Vital Sign    Days of Exercise per Week: Patient declined    Minutes of Exercise per Session: 0 min  Stress: Stress Concern Present (05/24/2023)   [employer] of Occupational Health - Occupational Stress Questionnaire    Feeling of Stress : To some extent  Social Connections: Unknown (06/22/2023)   Social Connection and  Isolation Panel [NHANES]    Frequency of Communication with Friends and Family: Three times a week    Frequency of Social Gatherings with Friends and Family: Twice a week    Attends Religious Services: Patient declined    Active Member of Clubs or Organizations: Yes    Attends Banker Meetings: 1 to 4 times per year    Marital Status: Widowed  Intimate Partner Violence: Not At Risk (07/01/2022)   Humiliation, Afraid, Rape, and Kick questionnaire    Fear of Current or Ex-Partner: No    Emotionally Abused: No    Physically Abused: No    Sexually Abused: No   Allergies: No Known Allergies  Current Medications: Current Outpatient Medications  Medication Sig Dispense Refill   albuterol (VENTOLIN HFA) 108 (90 Base) MCG/ACT inhaler Inhale 2 puffs into the lungs every 6 (six) hours as needed for wheezing or shortness of breath. 8 g 1   amLODipine (NORVASC) 5 MG tablet Take 1 tablet (5 mg total) by mouth daily. 90 tablet 1   fluticasone (FLONASE) 50 MCG/ACT nasal spray Place 2 sprays into both nostrils daily.     omeprazole (PRILOSEC) 20 MG capsule Take 1 capsule (20 mg total) by mouth daily. 30 capsule 3   pregabalin (LYRICA) 200 MG capsule Take 1 capsule (200 mg total) by mouth 2 (two) times daily. 60 capsule 3   No current facility-administered medications for this visit.   Review of Systems General:  no complaints Skin: no complaints Eyes: no complaints HEENT: no complaints Breasts: no complaints Pulmonary: no complaints Cardiac: no complaints Gastrointestinal: no complaints Genitourinary/Sexual: no complaints Ob/Gyn: no complaints Musculoskeletal: back pain Hematology: no complaints Neurologic/Psych: no complaints   Objective:  Physical Examination:  LMP  (LMP Unknown)    ECOG Performance Status: 1 - Symptomatic but completely ambulatory  GENERAL: Patient is a well appearing female in no acute distress HEENT:  Sclera clear. Anicteric NODES:  Negative  axillary, supraclavicular, inguinal lymph node survery LUNGS:  Clear to auscultation bilaterally.   HEART:  Regular rate and rhythm.  ABDOMEN:  Soft, nontender.  No hernias, incisions well healed. No masses or ascites EXTREMITIES:  No peripheral edema. Atraumatic. No cyanosis SKIN:  Clear with no obvious rashes or skin changes.  NEURO:  Nonfocal. Well  oriented.  Appropriate affect.  Pelvic exam: Exam chaperoned by nursing. Normal external genitalia, vulva, vagina, cervix, uterus and adnexa, VULVA: normal appearing vulva with no masses, tenderness or lesions, VAGINA: normal appearing vagina with normal color and discharge, no lesions, CERVIX: surgically absent, UTERUS: surgically absent, vaginal cuff well healed, BME  nontender and no masses  Lab Review No labs on site today  Radiologic Imaging: As per HPI   Assessment:  Allison Dixon is a 62 y.o. female diagnosed with stage IA grade 1 endometrial cancer.  She underwent TLH BSO with bilateral pelvic SLN mapping and right periurethral biopsy with Dr. Johnnette Litter and Dr. Valentino Saxon on 05/22/20 with invasion 5/25 mm and negative LVSI, adnexa, cervix and washings. SLN not biopsied due to no mapping because of morbid obesity. Periurethral biopsy negative. Clincally NED.   Cancer has loss of MLH1 and PMS2 and MLH1 promoter methylation assay positive. No need for Lynch Syndrome testing.   History of exploratory laparotomy for ectopic pregnancy.    Medical co-morbidities complicating care:  morbid obesity (MBI 42), HTN and prior abdominal surgery.  Plan:   Problem List Items Addressed This Visit       Other   History of endometrial cancer - Primary    Follow up with Dr. Valentino Saxon in 6 months and with [place] in a year.  Can return sooner if any bleeding, discharge, pain or other concerning symptoms.  Alinda Dooms, NP   I personally interviewed and examined the patient. Agreed with the above/below plan of care. I have directly contributed to assessment  and plan of care of this patient and educated and discussed with patient and family.  Leida Lauth, MD

## 2023-06-25 ENCOUNTER — Encounter: Payer: Self-pay | Admitting: Family Medicine

## 2023-06-25 ENCOUNTER — Ambulatory Visit: Payer: Medicare Other | Admitting: Family Medicine

## 2023-06-25 VITALS — BP 124/76 | HR 63 | Ht 60.0 in | Wt 238.4 lb

## 2023-06-25 DIAGNOSIS — M5442 Lumbago with sciatica, left side: Secondary | ICD-10-CM

## 2023-06-25 DIAGNOSIS — M5441 Lumbago with sciatica, right side: Secondary | ICD-10-CM

## 2023-06-25 DIAGNOSIS — G8929 Other chronic pain: Secondary | ICD-10-CM | POA: Diagnosis not present

## 2023-06-25 MED ORDER — MELOXICAM 15 MG PO TABS
15.0000 mg | ORAL_TABLET | Freq: Every day | ORAL | 1 refills | Status: DC
Start: 2023-06-25 — End: 2024-02-01

## 2023-06-25 MED ORDER — PREGABALIN 200 MG PO CAPS: 200.0000 mg | ORAL_CAPSULE | Freq: Two times a day (BID) | ORAL | 1 refills | Status: DC

## 2023-06-25 NOTE — Progress Notes (Signed)
BP 124/76   Pulse 63   Ht 5' (1.524 m)   Wt 238 lb 6.4 oz (108.1 kg)   LMP  (LMP Unknown)   SpO2 97%   BMI 46.56 kg/m    Subjective:    Patient ID: Allison Dixon, female    DOB: 24-May-1961, 62 y.o.   MRN: [id number]  HPI: Allison Dixon is a 62 y.o. female  Chief Complaint  Patient presents with   Sciatica    Patient says she is still having issues with her Sciatica and says it is about the same.    BACK PAIN Duration: chronic Mechanism of injury: unknown Location: low back bilaterally Onset: gradual Severity: moderate Quality: aching and tingling Frequency: constant Radiation: both legs below the knees Aggravating factors: nothing Alleviating factors: ?lyrica Status: stable Treatments attempted:  injections, rest, ice, heat, APAP, ibuprofen, aleve, and physical therapy  Relief with NSAIDs?: no Nighttime pain:  no Paresthesias / decreased sensation:  yes Bowel / bladder incontinence:  no Fevers:  no Dysuria / urinary frequency:  no  Relevant past medical, surgical, family and social history reviewed and updated as indicated. Interim medical history since our last visit reviewed. Allergies and medications reviewed and updated.  Review of Systems  Constitutional: Negative.   Respiratory: Negative.    Cardiovascular: Negative.   Gastrointestinal: Negative.   Musculoskeletal: Negative.   Neurological: Negative.   Psychiatric/Behavioral: Negative.      Per HPI unless specifically indicated above     Objective:    BP 124/76   Pulse 63   Ht 5' (1.524 m)   Wt 238 lb 6.4 oz (108.1 kg)   LMP  (LMP Unknown)   SpO2 97%   BMI 46.56 kg/m   Wt Readings from Last 3 Encounters:  06/25/23 238 lb 6.4 oz (108.1 kg)  06/23/23 237 lb 12.8 oz (107.9 kg)  05/25/23 235 lb 12.8 oz (107 kg)    Physical Exam Vitals and nursing note reviewed.  Constitutional:      General: She is not in acute distress.    Appearance: Normal appearance. She is obese. She is not  ill-appearing, toxic-appearing or diaphoretic.  HENT:     Head: Normocephalic and atraumatic.     Right Ear: External ear normal.     Left Ear: External ear normal.     Nose: Nose normal.     Mouth/Throat:     Mouth: Mucous membranes are moist.     Pharynx: Oropharynx is clear.  Eyes:     General: No scleral icterus.       Right eye: No discharge.        Left eye: No discharge.     Extraocular Movements: Extraocular movements intact.     Conjunctiva/sclera: Conjunctivae normal.     Pupils: Pupils are equal, round, and reactive to light.  Cardiovascular:     Rate and Rhythm: Normal rate and regular rhythm.     Pulses: Normal pulses.     Heart sounds: Normal heart sounds. No murmur heard.    No friction rub. No gallop.  Pulmonary:     Effort: Pulmonary effort is normal. No respiratory distress.     Breath sounds: Normal breath sounds. No stridor. No wheezing, rhonchi or rales.  Chest:     Chest wall: No tenderness.  Musculoskeletal:        General: Normal range of motion.     Cervical back: Normal range of motion and neck supple.  Skin:  General: Skin is warm and dry.     Capillary Refill: Capillary refill takes less than 2 seconds.     Coloration: Skin is not jaundiced or pale.     Findings: No bruising, erythema, lesion or rash.  Neurological:     General: No focal deficit present.     Mental Status: She is alert and oriented to person, place, and time. Mental status is at baseline.  Psychiatric:        Mood and Affect: Mood normal.        Behavior: Behavior normal.        Thought Content: Thought content normal.        Judgment: Judgment normal.     Results for orders placed or performed in visit on 04/09/23  Cytology - PAP  Result Value Ref Range   High risk HPV Negative    Adequacy      Satisfactory for evaluation; transformation zone component ABSENT.   Diagnosis      - Negative for intraepithelial lesion or malignancy (NILM)   Comment Normal Reference Range  HPV - Negative       Assessment & Plan:   Problem List Items Addressed This Visit       Nervous and Auditory   Chronic bilateral low back pain with bilateral sciatica - Primary    Discussed physical therapy and aquatherapy- she declined at this time. Advised follow up with her neurosurgeon. Continue lyrica. Will start her on meloxicam. Call with any concerns.       Relevant Medications   meloxicam (MOBIC) 15 MG tablet   pregabalin (LYRICA) 200 MG capsule     Follow up plan: Return in about 6 weeks (around 08/06/2023).

## 2023-06-25 NOTE — Assessment & Plan Note (Signed)
Discussed physical therapy and aquatherapy- she declined at this time. Advised follow up with her neurosurgeon. Continue lyrica. Will start her on meloxicam. Call with any concerns.

## 2023-07-09 ENCOUNTER — Telehealth: Payer: Self-pay | Admitting: Family Medicine

## 2023-07-09 NOTE — Telephone Encounter (Signed)
Allison Dixon, with [facility], has called and will be faxing over requisition forms for PCP to review today 11.1.2024 to fax # [phone].

## 2023-07-15 ENCOUNTER — Ambulatory Visit: Payer: Medicare Other | Admitting: Emergency Medicine

## 2023-07-15 VITALS — Ht 61.0 in | Wt 238.0 lb

## 2023-07-15 DIAGNOSIS — Z0000 Encounter for general adult medical examination without abnormal findings: Secondary | ICD-10-CM | POA: Diagnosis not present

## 2023-07-15 NOTE — Progress Notes (Signed)
Subjective:   Allison Dixon is a 62 y.o. female who presents for Medicare Annual (Subsequent) preventive examination.  Visit Complete: Virtual I connected with  Ruben Gottron on 07/15/23 by a audio enabled telemedicine application and verified that I am speaking with the correct person using two identifiers.  Patient Location: Home  Provider Location: Home Office  I discussed the limitations of evaluation and management by telemedicine. The patient expressed understanding and agreed to proceed.  Vital Signs: Because this visit was a virtual/telehealth visit, some criteria may be missing or patient reported. Any vitals not documented were not able to be obtained and vitals that have been documented are patient reported.   Cardiac Risk Factors include: hypertension;obesity (BMI >30kg/m2);Other (see comment), Risk factor comments: prediabetic     Objective:    Today's Vitals   07/15/23 0944  Weight: 238 lb (108 kg)  Height: 5\' 1"  (1.549 m)  PainSc: 8    Body mass index is 44.97 kg/m.     07/15/2023    9:53 AM 06/23/2023   10:23 AM 07/01/2022    1:11 PM 06/24/2022   10:21 AM 02/24/2022   10:27 AM 05/31/2021   10:19 AM 12/25/2020    2:06 PM  Advanced Directives  Does Patient Have a Medical Advance Directive? No No No No No No No  Would patient like information on creating a medical advance directive? Yes (MAU/Ambulatory/Procedural Areas - Information given) No - Patient declined No - Patient declined No - Patient declined No - Patient declined No - Patient declined     Current Medications (verified) Outpatient Encounter Medications as of 07/15/2023  Medication Sig   albuterol (VENTOLIN HFA) 108 (90 Base) MCG/ACT inhaler Inhale 2 puffs into the lungs every 6 (six) hours as needed for wheezing or shortness of breath.   amLODipine (NORVASC) 5 MG tablet Take 1 tablet (5 mg total) by mouth daily.   fluticasone (FLONASE) 50 MCG/ACT nasal spray Place 2 sprays into both nostrils daily.    meloxicam (MOBIC) 15 MG tablet Take 1 tablet (15 mg total) by mouth daily.   omeprazole (PRILOSEC) 20 MG capsule Take 1 capsule (20 mg total) by mouth daily.   pregabalin (LYRICA) 200 MG capsule Take 1 capsule (200 mg total) by mouth 2 (two) times daily.   No facility-administered encounter medications on file as of 07/15/2023.    Allergies (verified) Patient has no known allergies.   History: Past Medical History:  Diagnosis Date   Cancer (HCC) 2021   Uterine    COVID    Endometrial cancer (HCC)    Hypertension    Morbid obesity (HCC)    Sleep apnea    Past Surgical History:  Procedure Laterality Date   DILATION AND CURETTAGE, DIAGNOSTIC / THERAPEUTIC     ESOPHAGOGASTRODUODENOSCOPY (EGD) WITH PROPOFOL N/A 02/24/2022   Procedure: ESOPHAGOGASTRODUODENOSCOPY (EGD) WITH PROPOFOL;  Surgeon: Midge Minium, MD;  Location: [facility] ENDOSCOPY;  Service: Endoscopy;  Laterality: N/A;   EXPLORATORY LAPAROTOMY     for ectopic pregnancy   FOOT SURGERY Left    TOTAL LAPAROSCOPIC HYSTERECTOMY WITH BILATERAL SALPINGO OOPHORECTOMY Bilateral 05/22/2020   Procedure: TOTAL LAPAROSCOPIC HYSTERECTOMY WITH BILATERAL SALPINGO OOPHORECTOMY AND WASHINGS, PERINEAL BIOPSY;  Surgeon: Leida Lauth, MD;  Location: [facility] ORS;  Service: Gynecology;  Laterality: Bilateral;   Family History  Problem Relation Age of Onset   Heart attack Mother    Cancer Mother    Cancer Father    Breast cancer Neg Hx    Social  History   Socioeconomic History   Marital status: Widowed    Spouse name: Not on file   Number of children: 7   Years of education: Not on file   Highest education level: 10th grade  Occupational History   Occupation: disability  Tobacco Use   Smoking status: Never   Smokeless tobacco: Never  Vaping Use   Vaping status: Never Used  Substance and Sexual Activity   Alcohol use: Never   Drug use: Never   Sexual activity: Not Currently    Birth control/protection: Surgical  Other Topics  Concern   Not on file  Social History Narrative   Not on file   Social Determinants of Health   Financial Resource Strain: Low Risk  (07/15/2023)   Overall Financial Resource Strain (CARDIA)    Difficulty of Paying Living Expenses: Not hard at all  Food Insecurity: No Food Insecurity (07/15/2023)   Hunger Vital Sign    Worried About Running Out of Food in the Last Year: Never true    Ran Out of Food in the Last Year: Never true  Recent Concern: Food Insecurity - Food Insecurity Present (05/24/2023)   Hunger Vital Sign    Worried About Running Out of Food in the Last Year: Sometimes true    Ran Out of Food in the Last Year: Sometimes true  Transportation Needs: No Transportation Needs (07/15/2023)   PRAPARE - Administrator, Civil Service (Medical): No    Lack of Transportation (Non-Medical): No  Physical Activity: Inactive (07/15/2023)   Exercise Vital Sign    Days of Exercise per Week: 0 days    Minutes of Exercise per Session: 0 min  Stress: No Stress Concern Present (07/15/2023)   [employer] of Occupational Health - Occupational Stress Questionnaire    Feeling of Stress : Only a little  Recent Concern: Stress - Stress Concern Present (05/24/2023)   [employer] of Occupational Health - Occupational Stress Questionnaire    Feeling of Stress : To some extent  Social Connections: Moderately Isolated (07/15/2023)   Social Connection and Isolation Panel [NHANES]    Frequency of Communication with Friends and Family: More than three times a week    Frequency of Social Gatherings with Friends and Family: More than three times a week    Attends Religious Services: More than 4 times per year    Active Member of [employer] or Organizations: No    Attends Banker Meetings: Never    Marital Status: Widowed    Tobacco Counseling Counseling given: Not Answered   Clinical Intake:  Pre-visit preparation completed: Yes  Pain : 0-10 Pain Score: 8  Pain  Type: Chronic pain Pain Location: Back Pain Descriptors / Indicators: Aching     BMI - recorded: 44.97 Nutritional Status: BMI > 30  Obese Nutritional Risks: None Diabetes: No  How often do you need to have someone help you when you read instructions, pamphlets, or other written materials from your doctor or pharmacy?: 1 - Never  Interpreter Needed?: No  Information entered by :: Tora Kindred, CMA   Activities of Daily Living    07/15/2023    9:46 AM  In your present state of health, do you have any difficulty performing the following activities:  Hearing? 0  Vision? 0  Difficulty concentrating or making decisions? 0  Walking or climbing stairs? 1  Comment uses a cane prn  Dressing or bathing? 0  Doing errands, shopping? 0  Preparing Food and  eating ? N  Using the Toilet? N  In the past six months, have you accidently leaked urine? N  Do you have problems with loss of bowel control? N  Managing your Medications? N  Managing your Finances? N  Housekeeping or managing your Housekeeping? N    Patient Care Team: Dorcas Carrow, DO as PCP - General (Family Medicine) Debbe Odea, MD as PCP - Cardiology (Cardiology) Debbe Odea, MD as Consulting Physician (Cardiology) Benita Gutter, RN as Oncology Nurse Navigator Leida Lauth, MD as Referring Physician (Obstetrics)  Indicate any recent Medical Services you may have received from other than [facility] providers in the past year (date may be approximate).     Assessment:   This is a routine wellness examination for [place].  Hearing/Vision screen Hearing Screening - Comments:: Denies hearing loss Vision Screening - Comments:: Does not get eye exams   Goals Addressed               This Visit's Progress     Patient Stated (pt-stated)        Drink more water and lose weight ~50lb      Depression Screen    07/15/2023    9:50 AM 06/25/2023    8:36 AM 05/25/2023    8:46 AM 04/09/2023   11:16 AM  01/06/2023    9:14 AM 08/04/2022    8:42 AM 07/01/2022    1:13 PM  PHQ 2/9 Scores  PHQ - 2 Score 1 4 3 2  0 2 0  PHQ- 9 Score 1 6 8 7 5 6  0    Fall Risk    07/15/2023    9:53 AM 05/25/2023    8:45 AM 04/09/2023   11:16 AM 01/06/2023    9:14 AM 08/04/2022    8:42 AM  Fall Risk   Falls in the past year? 0 0 0 0 0  Number falls in past yr: 0 0 0 0 0  Injury with Fall? 0 0 0 0 0  Risk for fall due to : No Fall Risks No Fall Risks No Fall Risks No Fall Risks No Fall Risks  Follow up Falls prevention discussed Falls evaluation completed Falls evaluation completed Falls evaluation completed Falls evaluation completed    MEDICARE RISK AT HOME: Medicare Risk at Home Any stairs in or around the home?: Yes If so, are there any without handrails?: No Home free of loose throw rugs in walkways, pet beds, electrical cords, etc?: Yes Adequate lighting in your home to reduce risk of falls?: Yes Life alert?: No Use of a cane, walker or w/c?: Yes (cane prn) Grab bars in the bathroom?: No Shower chair or bench in shower?: Yes Elevated toilet seat or a handicapped toilet?: No  TIMED UP AND GO:  Was the test performed?  No    Cognitive Function:        07/15/2023    9:54 AM 07/01/2022    1:10 PM 05/31/2021    9:36 AM  6CIT Screen  What Year? 0 points 0 points 0 points  What month? 0 points 0 points 0 points  What time? 0 points 0 points 0 points  Count back from 20 0 points 0 points 0 points  Months in reverse 0 points 0 points 0 points  Repeat phrase 0 points 0 points 0 points  Total Score 0 points 0 points 0 points    Immunizations Immunization History  Administered Date(s) Administered   Td 07/29/2021    TDAP status:  Up to date  Flu Vaccine status: Declined, Education has been provided regarding the importance of this vaccine but patient still declined. Advised may receive this vaccine at local pharmacy or Health Dept. Aware to provide a copy of the vaccination record if obtained  from local pharmacy or Health Dept. Verbalized acceptance and understanding.  Pneumococcal vaccine status: Declined,  Education has been provided regarding the importance of this vaccine but patient still declined. Advised may receive this vaccine at local pharmacy or Health Dept. Aware to provide a copy of the vaccination record if obtained from local pharmacy or Health Dept. Verbalized acceptance and understanding.   Covid-19 vaccine status: Declined, Education has been provided regarding the importance of this vaccine but patient still declined. Advised may receive this vaccine at local pharmacy or Health Dept.or vaccine clinic. Aware to provide a copy of the vaccination record if obtained from local pharmacy or Health Dept. Verbalized acceptance and understanding.  Qualifies for Shingles Vaccine? Yes   Zostavax completed No   Shingrix Completed?: No.    Education has been provided regarding the importance of this vaccine. Patient has been advised to call insurance company to determine out of pocket expense if they have not yet received this vaccine. Advised may also receive vaccine at local pharmacy or Health Dept. Verbalized acceptance and understanding.  Screening Tests Health Maintenance  Topic Date Due   Zoster Vaccines- Shingrix (1 of 2) Never done   INFLUENZA VACCINE  12/06/2023 (Originally 04/08/2023)   Medicare Annual Wellness (AWV)  07/14/2024   Fecal DNA (Cologuard)  08/15/2024   MAMMOGRAM  04/20/2025   DTaP/Tdap/Td (2 - Tdap) 07/30/2031   Hepatitis C Screening  Completed   HIV Screening  Completed   HPV VACCINES  Aged Out   COVID-19 Vaccine  Discontinued    Health Maintenance  Health Maintenance Due  Topic Date Due   Zoster Vaccines- Shingrix (1 of 2) Never done    Colorectal cancer screening: Type of screening: Cologuard. Completed 08/15/21. Repeat every 3 years  MMG: last MMG 04/21/23 repeat every 2 yrs    Lung Cancer Screening: (Low Dose CT Chest recommended if Age  52-80 years, 20 pack-year currently smoking OR have quit w/in 15years.) does not qualify.   Lung Cancer Screening Referral: n/a  Additional Screening:  Hepatitis C Screening: does not qualify; Completed 07/29/21  Vision Screening: Recommended annual ophthalmology exams for early detection of glaucoma and other disorders of the eye.  Dental Screening: Recommended annual dental exams for proper oral hygiene    Community Resource Referral / Chronic Care Management: CRR required this visit?  No   CCM required this visit?  No     Plan:     I have personally reviewed and noted the following in the patient's chart:   Medical and social history Use of alcohol, tobacco or illicit drugs  Current medications and supplements including opioid prescriptions. Patient is not currently taking opioid prescriptions. Functional ability and status Nutritional status Physical activity Advanced directives List of other physicians Hospitalizations, surgeries, and ER visits in previous 12 months Vitals Screenings to include cognitive, depression, and falls Referrals and appointments  In addition, I have reviewed and discussed with patient certain preventive protocols, quality metrics, and best practice recommendations. A written personalized care plan for preventive services as well as general preventive health recommendations were provided to patient.     Tora Kindred, CMA   07/15/2023   After Visit Summary: (MyChart) Due to this being a telephonic visit,  the after visit summary with patients personalized plan was offered to patient via MyChart   Nurse Notes:  Declined flu, pneumo, covid and shingles vaccines Recommended eye exam, patient declined.

## 2023-07-15 NOTE — Patient Instructions (Addendum)
Ms. Rocque , Thank you for taking time to come for your Medicare Wellness Visit. I appreciate your ongoing commitment to your health goals. Please review the following plan we discussed and let me know if I can assist you in the future.   Referrals/Orders/Follow-Ups/Clinician Recommendations: Recommend getting an eye exam.  This is a list of the screening recommended for you and due dates:  Health Maintenance  Topic Date Due   Zoster (Shingles) Vaccine (1 of 2) Never done   Flu Shot  12/06/2023*   Medicare Annual Wellness Visit  07/14/2024   Cologuard (Stool DNA test)  08/15/2024   Mammogram  04/20/2025   DTaP/Tdap/Td vaccine (2 - Tdap) 07/30/2031   Hepatitis C Screening  Completed   HIV Screening  Completed   HPV Vaccine  Aged Out   COVID-19 Vaccine  Discontinued  *Topic was postponed. The date shown is not the original due date.    Advanced directives: (ACP Link)Information on Advanced Care Planning can be found at [facility] [place] Advance Health Care Directives Advance Health Care Directives ([email or web address]   Once you have completed the forms, please bring a copy of your health care power of attorney and living will to the office to be added to your chart at your convenience.   Next Medicare Annual Wellness Visit scheduled for next year: Yes, 07/27/24 @ 9:20am

## 2023-07-20 ENCOUNTER — Ambulatory Visit: Payer: Medicare Other | Attending: Primary Care

## 2023-07-20 DIAGNOSIS — R0602 Shortness of breath: Secondary | ICD-10-CM | POA: Diagnosis present

## 2023-07-20 DIAGNOSIS — R0609 Other forms of dyspnea: Secondary | ICD-10-CM | POA: Diagnosis not present

## 2023-07-20 LAB — PULMONARY FUNCTION TEST ARMC ONLY
DL/VA % pred: 109 %
DL/VA: 4.7 ml/min/mmHg/L
DLCO unc % pred: 78 %
DLCO unc: 14.36 ml/min/mmHg
FEF 25-75 Post: 3.42 L/s
FEF 25-75 Pre: 2.93 L/s
FEF2575-%Change-Post: 17 %
FEF2575-%Pred-Post: 156 %
FEF2575-%Pred-Pre: 134 %
FEV1-%Change-Post: 3 %
FEV1-%Pred-Post: 77 %
FEV1-%Pred-Pre: 74 %
FEV1-Post: 1.75 L
FEV1-Pre: 1.7 L
FEV1FVC-%Change-Post: 2 %
FEV1FVC-%Pred-Pre: 114 %
FEV6-%Change-Post: 0 %
FEV6-%Pred-Post: 67 %
FEV6-%Pred-Pre: 66 %
FEV6-Post: 1.92 L
FEV6-Pre: 1.9 L
FEV6FVC-%Pred-Post: 103 %
FEV6FVC-%Pred-Pre: 103 %
FVC-%Change-Post: 0 %
FVC-%Pred-Post: 65 %
FVC-%Pred-Pre: 64 %
FVC-Post: 1.92 L
FVC-Pre: 1.9 L
Post FEV1/FVC ratio: 92 %
Post FEV6/FVC ratio: 100 %
Pre FEV1/FVC ratio: 89 %
Pre FEV6/FVC Ratio: 100 %
RV % pred: 69 %
RV: 1.27 L
TLC % pred: 68 %
TLC: 3.17 L

## 2023-07-20 MED ORDER — ALBUTEROL SULFATE (2.5 MG/3ML) 0.083% IN NEBU
2.5000 mg | INHALATION_SOLUTION | Freq: Once | RESPIRATORY_TRACT | Status: AC
  Administered 2023-07-20: 2.5 mg via RESPIRATORY_TRACT
  Filled 2023-07-20: qty 3

## 2023-07-21 ENCOUNTER — Ambulatory Visit
Admission: RE | Admit: 2023-07-21 | Discharge: 2023-07-21 | Disposition: A | Discharge status: Home / Self Care | Payer: Medicare Other | Source: Ambulatory Visit | Attending: Primary Care | Admitting: Primary Care

## 2023-07-21 ENCOUNTER — Ambulatory Visit (INDEPENDENT_AMBULATORY_CARE_PROVIDER_SITE_OTHER): Payer: Medicare Other | Admitting: Primary Care

## 2023-07-21 ENCOUNTER — Encounter: Payer: Self-pay | Admitting: Primary Care

## 2023-07-21 VITALS — BP 130/78 | HR 69 | Temp 97.6°F | Ht 61.0 in | Wt 237.0 lb

## 2023-07-21 DIAGNOSIS — G4733 Obstructive sleep apnea (adult) (pediatric): Secondary | ICD-10-CM | POA: Diagnosis not present

## 2023-07-21 DIAGNOSIS — R0602 Shortness of breath: Secondary | ICD-10-CM | POA: Diagnosis not present

## 2023-07-21 NOTE — Progress Notes (Signed)
@Patient  ID: Allison Dixon, female    DOB: 1960/09/10, 62 y.o.   MRN: [id number]  No chief complaint on file.   Referring provider: Dorcas Carrow, DO  HPI: 62 year old female, never smoked.  Past medical history significant for hypertension, dyspnea, obstructive sleep apnea. Patient of Dr. Craige Cotta, seen for initial consult on 01/25/2020.    Previous [facility] pulmonary encounters: 8/31/2021Sudie Grumbling, NP [facility]  Patient presents today to review sleep study results.  She originally saw Dr. Craige Cotta back in May 2021 with reports of snoring and daytime somnolence.  Patient completed split-night sleep study on 04/11/2020 at [facility] which showed moderate obstructive sleep apnea with AHI 16.3/hour and SPO2 low 75%.  Recommended CPAP therapy with pressure setting of 10 cm H2O.  We discussed sleep study results today and treatment options.  Agreeing to initiate CPAP therapy.  Continue to encourage weight loss and avoid driving if experiencing excessive somnolence.  Patient continues to experience some exertional dyspnea.  She had a normal chest x-ray in May.  Family asking for temporary handicap placard as walking long distance causes her to get out of breath.  Patient had PFTs at [facility] that showed no obvious obstructive airway disease or restrictive lung disease.  She has never smoked.  06/02/2021 Patient presents today for 1 year follow-up.  Split-night sleep study in August 2021 showed moderate obstructive sleep apnea AHI 16.3 an hour with SPO2 low 75%.  She was started on CPAP at 10 cm H2O. She is doing well, no acute complaints. She is 93% compliant with CPAP use > 4 hours. Feels she is not getting enough air with CPAP use. She uses full face mask. Her mouth gets dry at night. No significant change in daytime sleepiness. [facility]worth 4/24.   Airview download 04/30/21-05/29/21 Usage 30/30 days; 28 days (93%) > 4 hours Average usage 7 hours 26 mins Pressure 10cm h20 Airleaks 0.0L/min (95%) AHI 0.3   11/06/2022  Patient presents  today for 1 year follow-up OSA. She reports improvement in sleep quality with CPAP use. No residual daytime sleepiness. She is 100% compliant with CPAP use >4 hours last 30 days. Current pressure 5-15cm h20; Residual AHI 3.3/hr. She feels she is not getting enough pressure at night. Mask is too small, she is working on getting a larger mask with [employer]. Additionally she has some shortness of breath symptoms and wheezing.  She does not smoke. No hx asthma. Experiences dyspnea with activities and stairs/inclines. No PFTs.  Airview download 10/06/22-10/09/26/24 Usage 30/30 days > 4 hours Average usage 7 hours 42 min Pressure 5-15cm h20 (11.2cm h20-95%) Airleaks 0.6L/min AHI 0.1   Moderate obstructive sleep apnea - Moderate Sleep apnea is well controlled. She reports benefits from CPAP use. Patient feels pressure is not strong enough. Currently on auto settings 5-15cm h20; residual AHI 0.1/hr. We will change from auto settings to set pressure 12cm h20. Advised patient continue to wear CPAP every 4-6 hours and encourage weight loss efforts. Follow-up 6 months with Beth NP or sooner if needed    Dyspnea - Patient has dyspnea and wheezing symptoms with exertion. No hx asthma. Never smoked. Recommend checking PFTs. Sending in RX Albuterol HFA 2 puff every 4-6 hours for shortness of breath/wheezing.      07/21/2023- interim hx Discussed the use of AI scribe software for clinical note transcription with the patient, who gave verbal consent to proceed.  History of Present Illness   Patient presents today for 6 months follow-up OSA on CPAP and  dyspnea. She reports intermittent difficulty taking deep breath while using CPAP at night. Despite this, she has elected to maintain the current pressure setting of 12cm h20, as she does not perceive the issue as significant enough to warrant a change.  In addition to sleep apnea, the patient has been experiencing intermittent shortness of breath and wheezing,  particularly during physical exertion such as walking or climbing stairs. She has been using a Albuterol inhaler approximately once a week, which she reports as helpful. She denies a history of asthma and smoking. She also reports an occasional dry cough, but does not consider it severe.  The patient's pulmonary function testing yesterday showed some restriction, which could be weight-related or due to other factors. She has not had a chest x-ray in nearly three years.      Airview download 06/20/2023 - 07/19/2023 Usage days 30/30 days (100%) Average usage 7 hours 42 minutes Pressure 12 cm H2O Air leaks 1.0 L/min (95%) AHI 0.1  Pulmonary function testing 07/20/23 PFT>> FVC 1.92 (65%), FEV1 1.75 (77%), ratio 92, DLCO 18.36 (78% ( Moderate restrictive lung disease with minimal diffusion defect  No Known Allergies  Immunization History  Administered Date(s) Administered   Td 07/29/2021    Past Medical History:  Diagnosis Date   Cancer (HCC) 2021   Uterine    COVID    Endometrial cancer (HCC)    Hypertension    Morbid obesity (HCC)    Sleep apnea     Tobacco History: Social History   Tobacco Use  Smoking Status Never  Smokeless Tobacco Never   Counseling given: Not Answered   Outpatient Medications Prior to Visit  Medication Sig Dispense Refill   albuterol (VENTOLIN HFA) 108 (90 Base) MCG/ACT inhaler Inhale 2 puffs into the lungs every 6 (six) hours as needed for wheezing or shortness of breath. 8 g 1   amLODipine (NORVASC) 5 MG tablet Take 1 tablet (5 mg total) by mouth daily. 90 tablet 1   fluticasone (FLONASE) 50 MCG/ACT nasal spray Place 2 sprays into both nostrils daily.     meloxicam (MOBIC) 15 MG tablet Take 1 tablet (15 mg total) by mouth daily. 90 tablet 1   omeprazole (PRILOSEC) 20 MG capsule Take 1 capsule (20 mg total) by mouth daily. 30 capsule 3   pregabalin (LYRICA) 200 MG capsule Take 1 capsule (200 mg total) by mouth 2 (two) times daily. 180 capsule 1    No facility-administered medications prior to visit.   Review of Systems  Review of Systems  Constitutional: Negative.   Respiratory:  Positive for shortness of breath. Negative for cough and wheezing.    Physical Exam  LMP  (LMP Unknown)  Physical Exam Constitutional:      General: She is not in acute distress.    Appearance: Normal appearance. She is obese. She is not ill-appearing.  HENT:     Head: Normocephalic and atraumatic.  Cardiovascular:     Rate and Rhythm: Normal rate and regular rhythm.  Pulmonary:     Effort: Pulmonary effort is normal.     Breath sounds: Normal breath sounds. No wheezing or rales.  Skin:    General: Skin is warm and dry.  Neurological:     General: No focal deficit present.     Mental Status: She is alert and oriented to person, place, and time. Mental status is at baseline.  Psychiatric:        Mood and Affect: Mood normal.  Behavior: Behavior normal.        Thought Content: Thought content normal.        Judgment: Judgment normal.      Lab Results:  CBC    Component Value Date/Time   WBC 5.2 01/06/2023 0917   WBC 7.8 08/27/2020 1614   RBC 4.67 01/06/2023 0917   RBC 4.88 08/27/2020 1614   HGB 13.6 01/06/2023 0917   HCT 41.3 01/06/2023 0917   PLT 259 01/06/2023 0917   MCV 88 01/06/2023 0917   MCV 85 12/04/2013 2302   MCH 29.1 01/06/2023 0917   MCH 27.9 08/27/2020 1614   MCHC 32.9 01/06/2023 0917   MCHC 32.2 08/27/2020 1614   RDW 13.1 01/06/2023 0917   RDW 14.0 12/04/2013 2302   LYMPHSABS 1.8 01/06/2023 0917   MONOABS 0.8 08/27/2020 1614   EOSABS 0.1 01/06/2023 0917   BASOSABS 0.0 01/06/2023 0917    BMET    Component Value Date/Time   NA 143 01/06/2023 0917   NA 139 12/04/2013 2302   K 3.9 01/06/2023 0917   K 3.6 12/04/2013 2302   CL 107 (H) 01/06/2023 0917   CL 107 12/04/2013 2302   CO2 24 01/06/2023 0917   CO2 27 12/04/2013 2302   GLUCOSE 101 (H) 01/06/2023 0917   GLUCOSE 102 (H) 08/27/2020 1614    GLUCOSE 92 12/04/2013 2302   BUN 12 01/06/2023 0917   BUN 19 (H) 12/04/2013 2302   CREATININE 0.76 01/06/2023 0917   CREATININE 0.95 12/04/2013 2302   CALCIUM 9.3 01/06/2023 0917   CALCIUM 8.6 12/04/2013 2302   GFRNONAA >60 08/27/2020 1614   GFRNONAA >60 12/04/2013 2302   GFRAA >60 05/15/2020 1329   GFRAA >60 12/04/2013 2302    BNP    Component Value Date/Time   BNP 21.4 01/14/2021 0922    ProBNP No results found for: "PROBNP"  Imaging: No results found.   Assessment & Plan:   1. Shortness of breath - DG Chest 2 View; Future  2. OSA (obstructive sleep apnea) - AMB REFERRAL FOR DME      Obstructive Sleep Apnea Patient reports occasional discomfort with CPAP, feeling like not getting enough air. However, CPAP download shows 100% compliance with average use of 7 hours and 42 minutes per night. Current pressure 12cm h20 with residual AHI is 0.1, indicating excellent control of sleep apnea. Discussed potential adjustment of pressure settings, but patient prefers to keep current settings. -Continue current CPAP settings and renew CPAP supplies  -Contact office if discomfort persists or worsens.  Shortness of Breath Patient reports intermittent shortness of breath, particularly with exertion such as climbing stairs. No history of asthma or smoking. No significant cough. Recent pulmonary function tests showed moderate restriction with minimal diffusion defect, potentially weight-related. -Order chest x-ray to evaluate for potential causes of restrictive lung disease. -If chest x-ray is abnormal, proceed with CT scan.  Follow-up If chest x-ray is normal, follow-up in 1 year unless new issues arise.       Glenford Bayley, NP 07/21/2023

## 2023-07-21 NOTE — Patient Instructions (Addendum)
 -  OBSTRUCTIVE SLEEP APNEA: Obstructive sleep apnea is a condition where the airway becomes blocked during sleep, causing breathing pauses. You reported occasional discomfort with your CPAP machine, feeling like you are not getting enough air. However, your CPAP data shows excellent control of your sleep apnea. We discussed adjusting the pressure settings, but you prefer to keep them as they are. Please continue with your current CPAP settings and contact our office if the discomfort persists or worsens.  -SHORTNESS OF BREATH: Shortness of breath can be caused by various factors, including physical exertion and weight. You have been experiencing this, especially when walking or climbing stairs, and have found relief using your inhaler. We will order a chest x-ray to check for any potential causes of restrictive lung disease. If the chest x-ray shows any abnormalities, we will proceed with a CT scan to get more detailed information.   Orders: CXR today (ordered)  Renew CPAP supplies (ordered)  Follow-up 1 year with Beth NP or sooner if needed

## 2023-08-03 ENCOUNTER — Encounter: Payer: Self-pay | Admitting: Family Medicine

## 2023-08-03 ENCOUNTER — Ambulatory Visit (INDEPENDENT_AMBULATORY_CARE_PROVIDER_SITE_OTHER): Payer: Medicare Other | Admitting: Family Medicine

## 2023-08-03 VITALS — BP 106/67 | HR 78 | Temp 97.9°F | Resp 14 | Wt 234.4 lb

## 2023-08-03 DIAGNOSIS — I10 Essential (primary) hypertension: Secondary | ICD-10-CM

## 2023-08-03 DIAGNOSIS — G8929 Other chronic pain: Secondary | ICD-10-CM

## 2023-08-03 DIAGNOSIS — M5442 Lumbago with sciatica, left side: Secondary | ICD-10-CM

## 2023-08-03 DIAGNOSIS — R7303 Prediabetes: Secondary | ICD-10-CM

## 2023-08-03 DIAGNOSIS — Z136 Encounter for screening for cardiovascular disorders: Secondary | ICD-10-CM

## 2023-08-03 DIAGNOSIS — M5441 Lumbago with sciatica, right side: Secondary | ICD-10-CM

## 2023-08-03 LAB — MICROALBUMIN, URINE WAIVED
Creatinine, Urine Waived: 300 mg/dL (ref 10–300)
Microalb, Ur Waived: 30 mg/L — ABNORMAL HIGH (ref 0–19)
Microalb/Creat Ratio: <30 mg/g (ref <30)

## 2023-08-03 LAB — BAYER DCA HB A1C WAIVED: HB A1C (BAYER DCA - WAIVED): 5.4 % (ref 4.8–5.6)

## 2023-08-03 MED ORDER — ALBUTEROL SULFATE HFA 108 (90 BASE) MCG/ACT IN AERS: 2.0000 | INHALATION_SPRAY | Freq: Four times a day (QID) | RESPIRATORY_TRACT | 6 refills | Status: DC | PRN

## 2023-08-03 MED ORDER — AMLODIPINE BESYLATE 5 MG PO TABS: 5.0000 mg | ORAL_TABLET | Freq: Every day | ORAL | 1 refills | Status: DC

## 2023-08-03 MED ORDER — OMEPRAZOLE 20 MG PO CPDR: 20.0000 mg | DELAYED_RELEASE_CAPSULE | Freq: Every day | ORAL | 3 refills | Status: DC

## 2023-08-03 NOTE — Assessment & Plan Note (Signed)
Under good control on current regimen. Continue current regimen. Continue to monitor. Call with any concerns. Refills given. Call if she'd like to go back to ortho or do aquatherapy

## 2023-08-03 NOTE — Assessment & Plan Note (Signed)
Doing great with A1c of 5.4. Continue current regimen. Call with any concerns. Continue to monitor.

## 2023-08-03 NOTE — Assessment & Plan Note (Signed)
Under good control on current regimen. Continue current regimen. Continue to monitor. Call with any concerns. Refills given. Labs drawn today.   

## 2023-08-03 NOTE — Progress Notes (Signed)
BP 106/67 (BP Location: Left Wrist, Patient Position: Sitting, Cuff Size: Normal)   Pulse 78   Temp 97.9 F (36.6 C) (Oral)   Resp 14   Wt 234 lb 6.4 oz (106.3 kg)   LMP  (LMP Unknown)   SpO2 97%   BMI 44.29 kg/m    Subjective:    Patient ID: Allison Dixon, female    DOB: 04/14/1961, 62 y.o.   MRN: [id number]  HPI: Allison Dixon is a 62 y.o. female  Chief Complaint  Patient presents with   Back Pain    About the same. No new injury   Hypertension   HYPERTENSION  Hypertension status: controlled  Satisfied with current treatment? yes Duration of hypertension: chronic BP monitoring frequency:  not checking BP medication side effects:  no Medication compliance: excellent compliance Previous BP meds: amlodipine Aspirin: no Recurrent headaches: no Visual changes: no Palpitations: no Dyspnea: no Chest pain: no Lower extremity edema: no Dizzy/lightheaded: no  Impaired Fasting Glucose HbA1C:  Lab Results  Component Value Date   HGBA1C 5.4 08/03/2023   Duration of elevated blood sugar: chronic Polydipsia: no Polyuria: no Weight change: no Visual disturbance: no Glucose Monitoring: no Diabetic Education: Not Completed Family history of diabetes: yes  BACK PAIN Duration: chronic Mechanism of injury: unknown Location: bilateral and low back Onset: gradual Severity: moderate Quality: aching, shooting, sore Frequency: constant Radiation: both legs below the knees Aggravating factors: nothing Alleviating factors: ?lyrica, meloxicam Status: stable, maybe a little better Treatments attempted:  injections, rest, ice, heat, APAP, ibuprofen, aleve, and physical therapy  Relief with NSAIDs?: no Nighttime pain:  no Paresthesias / decreased sensation:  yes Bowel / bladder incontinence:  no Fevers:  no Dysuria / urinary frequency:  no  Relevant past medical, surgical, family and social history reviewed and updated as indicated. Interim medical history since our last  visit reviewed. Allergies and medications reviewed and updated.  Review of Systems  Constitutional: Negative.   HENT: Negative.    Eyes: Negative.   Respiratory:  Positive for shortness of breath. Negative for apnea, cough, choking, chest tightness, wheezing and stridor.   Cardiovascular: Negative.   Gastrointestinal: Negative.   Endocrine: Negative.   Genitourinary: Negative.   Musculoskeletal:  Positive for back pain and myalgias. Negative for arthralgias, gait problem, joint swelling, neck pain and neck stiffness.  Skin: Negative.   Allergic/Immunologic: Negative.   Neurological: Negative.   Hematological: Negative.   Psychiatric/Behavioral: Negative.      Per HPI unless specifically indicated above     Objective:    BP 106/67 (BP Location: Left Wrist, Patient Position: Sitting, Cuff Size: Normal)   Pulse 78   Temp 97.9 F (36.6 C) (Oral)   Resp 14   Wt 234 lb 6.4 oz (106.3 kg)   LMP  (LMP Unknown)   SpO2 97%   BMI 44.29 kg/m   Wt Readings from Last 3 Encounters:  08/03/23 234 lb 6.4 oz (106.3 kg)  07/21/23 237 lb (107.5 kg)  07/15/23 238 lb (108 kg)    Physical Exam Vitals and nursing note reviewed.  Constitutional:      General: She is not in acute distress.    Appearance: Normal appearance. She is obese. She is not ill-appearing, toxic-appearing or diaphoretic.  HENT:     Head: Normocephalic and atraumatic.     Right Ear: Tympanic membrane, ear canal and external ear normal. There is no impacted cerumen.     Left Ear: Tympanic membrane, ear canal  and external ear normal. There is no impacted cerumen.     Nose: Nose normal. No congestion or rhinorrhea.     Mouth/Throat:     Mouth: Mucous membranes are moist.     Pharynx: Oropharynx is clear. No oropharyngeal exudate or posterior oropharyngeal erythema.  Eyes:     General: No scleral icterus.       Right eye: No discharge.        Left eye: No discharge.     Extraocular Movements: Extraocular movements  intact.     Conjunctiva/sclera: Conjunctivae normal.     Pupils: Pupils are equal, round, and reactive to light.  Neck:     Vascular: No carotid bruit.  Cardiovascular:     Rate and Rhythm: Normal rate and regular rhythm.     Pulses: Normal pulses.     Heart sounds: Murmur heard.     No friction rub. No gallop.  Pulmonary:     Effort: Pulmonary effort is normal. No respiratory distress.     Breath sounds: Normal breath sounds. No stridor. No wheezing, rhonchi or rales.  Chest:     Chest wall: No tenderness.  Abdominal:     General: Abdomen is flat. Bowel sounds are normal. There is no distension.     Palpations: Abdomen is soft. There is no mass.     Tenderness: There is no abdominal tenderness. There is no right CVA tenderness, left CVA tenderness, guarding or rebound.     Hernia: No hernia is present.  Genitourinary:    Comments: Breast and pelvic exams deferred with shared decision making Musculoskeletal:        General: No swelling, tenderness, deformity or signs of injury.     Cervical back: Normal range of motion and neck supple. No rigidity. No muscular tenderness.     Right lower leg: No edema.     Left lower leg: No edema.  Lymphadenopathy:     Cervical: No cervical adenopathy.  Skin:    General: Skin is warm and dry.     Capillary Refill: Capillary refill takes less than 2 seconds.     Coloration: Skin is not jaundiced or pale.     Findings: No bruising, erythema, lesion or rash.  Neurological:     General: No focal deficit present.     Mental Status: She is alert and oriented to person, place, and time. Mental status is at baseline.     Cranial Nerves: No cranial nerve deficit.     Sensory: No sensory deficit.     Motor: No weakness.     Coordination: Coordination normal.     Gait: Gait normal.     Deep Tendon Reflexes: Reflexes normal.  Psychiatric:        Mood and Affect: Mood normal.        Behavior: Behavior normal.        Thought Content: Thought content  normal.        Judgment: Judgment normal.     Results for orders placed or performed in visit on 08/03/23  Microalbumin, Urine Waived  Result Value Ref Range   Microalb, Ur Waived 30 (H) 0 - 19 mg/L   Creatinine, Urine Waived 300 10 - 300 mg/dL   Microalb/Creat Ratio <30 <30 mg/g  Bayer DCA Hb A1c Waived  Result Value Ref Range   HB A1C (BAYER DCA - WAIVED) 5.4 4.8 - 5.6 %      Assessment & Plan:   Problem List Items Addressed This  Visit       Cardiovascular and Mediastinum   Primary hypertension    Under good control on current regimen. Continue current regimen. Continue to monitor. Call with any concerns. Refills given. Labs drawn today.        Relevant Medications   amLODipine (NORVASC) 5 MG tablet   Other Relevant Orders   CBC with Differential/Platelet   Comprehensive metabolic panel   TSH   Microalbumin, Urine Waived (Completed)     Nervous and Auditory   Chronic bilateral low back pain with bilateral sciatica    Under good control on current regimen. Continue current regimen. Continue to monitor. Call with any concerns. Refills given. Call if she'd like to go back to ortho or do aquatherapy       Relevant Orders   CBC with Differential/Platelet   Comprehensive metabolic panel     Other   Prediabetes - Primary    Doing great with A1c of 5.4. Continue current regimen. Call with any concerns. Continue to monitor.       Relevant Orders   CBC with Differential/Platelet   Comprehensive metabolic panel   Bayer DCA Hb Q4O Waived (Completed)   Other Visit Diagnoses     Screening for cardiovascular condition       Labs drawn today. Await results. Treat as needed.   Relevant Orders   CBC with Differential/Platelet   Comprehensive metabolic panel   Lipid Panel w/o Chol/HDL Ratio        Follow up plan: Return in about 6 months (around 01/31/2024).

## 2023-08-04 LAB — CBC WITH DIFFERENTIAL/PLATELET
Basophils Absolute: 0.1 10*3/uL (ref 0.0–0.2)
Basos: 1 %
EOS (ABSOLUTE): 0.2 10*3/uL (ref 0.0–0.4)
Eos: 3 %
Hematocrit: 41.7 % (ref 34.0–46.6)
Hemoglobin: 13.3 g/dL (ref 11.1–15.9)
Immature Grans (Abs): 0 10*3/uL (ref 0.0–0.1)
Immature Granulocytes: 0 %
Lymphocytes Absolute: 1.8 10*3/uL (ref 0.7–3.1)
Lymphs: 32 %
MCH: 28.5 pg (ref 26.6–33.0)
MCHC: 31.9 g/dL (ref 31.5–35.7)
MCV: 89 fL (ref 79–97)
Monocytes Absolute: 0.5 10*3/uL (ref 0.1–0.9)
Monocytes: 8 %
Neutrophils Absolute: 3.2 10*3/uL (ref 1.4–7.0)
Neutrophils: 56 %
Platelets: 225 10*3/uL (ref 150–450)
RBC: 4.67 x10E6/uL (ref 3.77–5.28)
RDW: 12.5 % (ref 11.7–15.4)
WBC: 5.7 10*3/uL (ref 3.4–10.8)

## 2023-08-04 LAB — COMPREHENSIVE METABOLIC PANEL
ALT: 8 [IU]/L (ref 0–32)
AST: 13 [IU]/L (ref 0–40)
Albumin: 3.8 g/dL (ref 3.8–4.9)
Alkaline Phosphatase: 86 [IU]/L (ref 44–121)
BUN/Creatinine Ratio: 18 (ref 12–28)
BUN: 15 mg/dL (ref 8–27)
Bilirubin Total: 0.7 mg/dL (ref 0.0–1.2)
CO2: 23 mmol/L (ref 20–29)
Calcium: 9.1 mg/dL (ref 8.7–10.3)
Chloride: 106 mmol/L (ref 96–106)
Creatinine, Ser: 0.84 mg/dL (ref 0.57–1.00)
Globulin, Total: 3.3 g/dL (ref 1.5–4.5)
Glucose: 89 mg/dL (ref 70–99)
Potassium: 4 mmol/L (ref 3.5–5.2)
Sodium: 144 mmol/L (ref 134–144)
Total Protein: 7.1 g/dL (ref 6.0–8.5)
eGFR: 80 mL/min/{1.73_m2} (ref >59)

## 2023-08-04 LAB — LIPID PANEL W/O CHOL/HDL RATIO
Cholesterol, Total: 176 mg/dL (ref 100–199)
HDL: 74 mg/dL (ref >39)
LDL Chol Calc (NIH): 89 mg/dL (ref 0–99)
Triglycerides: 68 mg/dL (ref 0–149)
VLDL Cholesterol Cal: 13 mg/dL (ref 5–40)

## 2023-08-04 LAB — TSH: TSH: 1.2 u[IU]/mL (ref 0.450–4.500)

## 2023-10-04 ENCOUNTER — Other Ambulatory Visit: Payer: Self-pay

## 2023-10-04 MED ORDER — OMEPRAZOLE 20 MG PO CPDR: 20.0000 mg | DELAYED_RELEASE_CAPSULE | Freq: Every day | ORAL | 3 refills | Status: DC

## 2024-02-01 ENCOUNTER — Encounter: Payer: Self-pay | Admitting: Family Medicine

## 2024-02-01 ENCOUNTER — Ambulatory Visit (INDEPENDENT_AMBULATORY_CARE_PROVIDER_SITE_OTHER): Payer: Self-pay | Admitting: Family Medicine

## 2024-02-01 VITALS — BP 133/84 | HR 73 | Temp 97.8°F | Ht 61.0 in | Wt 242.8 lb

## 2024-02-01 DIAGNOSIS — M4726 Other spondylosis with radiculopathy, lumbar region: Secondary | ICD-10-CM | POA: Insufficient documentation

## 2024-02-01 DIAGNOSIS — I10 Essential (primary) hypertension: Secondary | ICD-10-CM

## 2024-02-01 DIAGNOSIS — R0609 Other forms of dyspnea: Secondary | ICD-10-CM | POA: Diagnosis not present

## 2024-02-01 DIAGNOSIS — R7303 Prediabetes: Secondary | ICD-10-CM | POA: Diagnosis not present

## 2024-02-01 LAB — BAYER DCA HB A1C WAIVED: HB A1C (BAYER DCA - WAIVED): 5.4 % (ref 4.8–5.6)

## 2024-02-01 MED ORDER — AMLODIPINE BESYLATE 5 MG PO TABS: 5.0000 mg | ORAL_TABLET | Freq: Every day | ORAL | 1 refills | Status: DC

## 2024-02-01 MED ORDER — MELOXICAM 15 MG PO TABS: 15.0000 mg | ORAL_TABLET | Freq: Every day | ORAL | 1 refills | Status: DC

## 2024-02-01 MED ORDER — ALBUTEROL SULFATE HFA 108 (90 BASE) MCG/ACT IN AERS: 2.0000 | INHALATION_SPRAY | Freq: Four times a day (QID) | RESPIRATORY_TRACT | 6 refills | Status: AC | PRN

## 2024-02-01 MED ORDER — OMEPRAZOLE 20 MG PO CPDR: 20.0000 mg | DELAYED_RELEASE_CAPSULE | Freq: Every day | ORAL | 1 refills | Status: DC

## 2024-02-01 NOTE — Assessment & Plan Note (Signed)
 Rechecking labs today. Await results. Treat as needed.

## 2024-02-01 NOTE — Progress Notes (Signed)
 BP 133/84 (BP Location: Left Arm, Patient Position: Sitting, Cuff Size: Large)   Pulse 73   Temp 97.8 F (36.6 C) (Oral)   Ht 5\' 1"  (1.549 m)   Wt 242 lb 12.8 oz (110.1 kg)   LMP  (LMP Unknown)   SpO2 97%   BMI 45.88 kg/m    Subjective:    Patient ID: Allison Dixon, female    DOB: [birth year], 63 y.o.   MRN: [id number]  HPI: Allison Dixon is a 63 y.o. female  Chief Complaint  Patient presents with   Hypertension   Back Pain   HYPERTENSION  Hypertension status: controlled  Satisfied with current treatment? yes Duration of hypertension: chronic BP monitoring frequency:  not checking BP medication side effects:  no Medication compliance: excellent compliance Previous BP meds: amlodipine  Aspirin: no Recurrent headaches: no Visual changes: no Palpitations: no Dyspnea: yes Chest pain: yes Lower extremity edema: no Dizzy/lightheaded: no  Impaired Fasting Glucose HbA1C:  Lab Results  Component Value Date   HGBA1C 5.4 08/03/2023   Duration of elevated blood sugar: chronic Polydipsia: no Polyuria: no Weight change: yes Visual disturbance: no Glucose Monitoring: no Diabetic Education: Not Completed Family history of diabetes: yes  SHORTNESS OF BREATH Duration: months to years Onset: gradual Description of breathing discomfort: can't catch her breath Severity: moderat Episode duration: minutes  Frequency: with exertion Related to exertion: yes Cough: no Chest tightness: yes Wheezing: no Fevers: no Chest pain: yes Palpitations: no  Nausea: no Diaphoresis: no Deconditioning: yes Status: worse  BACK PAIN- stopped her lyrica  because it wasn't working. Pain is getting worse. Was supposed to see neurosurgery in 2023 and never went.  Duration: chronic Mechanism of injury: unknown Location: bilateral and low back Onset: gradual Severity: severe Quality: aching and sore Frequency: constant Radiation: down her legs Aggravating factors: walking, moving,  standing Alleviating factors: nothing Status: worse Treatments attempted: meloxicam , shots, lyrica   Relief with NSAIDs?: no Nighttime pain:  yes Paresthesias / decreased sensation:  yes Bowel / bladder incontinence:  no Fevers:  no Dysuria / urinary frequency:  no  Relevant past medical, surgical, family and social history reviewed and updated as indicated. Interim medical history since our last visit reviewed. Allergies and medications reviewed and updated.  Review of Systems  Constitutional: Negative.   Respiratory:  Positive for chest tightness and shortness of breath. Negative for apnea, cough, choking, wheezing and stridor.   Cardiovascular:  Positive for chest pain. Negative for palpitations and leg swelling.  Musculoskeletal:  Positive for back pain and myalgias. Negative for arthralgias, gait problem, joint swelling, neck pain and neck stiffness.  Skin: Negative.   Neurological: Negative.   Psychiatric/Behavioral: Negative.      Per HPI unless specifically indicated above     Objective:     BP 133/84 (BP Location: Left Arm, Patient Position: Sitting, Cuff Size: Large)   Pulse 73   Temp 97.8 F (36.6 C) (Oral)   Ht 5\' 1"  (1.549 m)   Wt 242 lb 12.8 oz (110.1 kg)   LMP  (LMP Unknown)   SpO2 97%   BMI 45.88 kg/m   Wt Readings from Last 3 Encounters:  02/01/24 242 lb 12.8 oz (110.1 kg)  08/03/23 234 lb 6.4 oz (106.3 kg)  07/21/23 237 lb (107.5 kg)    Physical Exam Vitals and nursing note reviewed.  Constitutional:      General: She is not in acute distress.    Appearance: Normal appearance. She is obese. She is  not ill-appearing, toxic-appearing or diaphoretic.  HENT:     Head: Normocephalic and atraumatic.     Right Ear: External ear normal.     Left Ear: External ear normal.     Nose: Nose normal.     Mouth/Throat:     Mouth: Mucous membranes are moist.     Pharynx: Oropharynx is clear.  Eyes:     General: No scleral icterus.       Right eye: No  discharge.        Left eye: No discharge.     Extraocular Movements: Extraocular movements intact.     Conjunctiva/sclera: Conjunctivae normal.     Pupils: Pupils are equal, round, and reactive to light.  Cardiovascular:     Rate and Rhythm: Normal rate and regular rhythm.     Pulses: Normal pulses.     Heart sounds: Murmur heard.     No friction rub. No gallop.  Pulmonary:     Effort: Pulmonary effort is normal. No respiratory distress.     Breath sounds: Normal breath sounds. No stridor. No wheezing, rhonchi or rales.  Chest:     Chest wall: No tenderness.  Musculoskeletal:        General: Normal range of motion.     Cervical back: Normal range of motion and neck supple.  Skin:    General: Skin is warm and dry.     Capillary Refill: Capillary refill takes less than 2 seconds.     Coloration: Skin is not jaundiced or pale.     Findings: No bruising, erythema, lesion or rash.  Neurological:     General: No focal deficit present.     Mental Status: She is alert and oriented to person, place, and time. Mental status is at baseline.  Psychiatric:        Mood and Affect: Mood normal.        Behavior: Behavior normal.        Thought Content: Thought content normal.        Judgment: Judgment normal.     Results for orders placed or performed in visit on 08/03/23  Microalbumin, Urine Waived   Collection Time: 08/03/23  8:50 AM  Result Value Ref Range   Microalb, Ur Waived 30 (H) 0 - 19 mg/L   Creatinine, Urine Waived 300 10 - 300 mg/dL   Microalb/Creat Ratio <30 <30 mg/g  Bayer DCA Hb A1c Waived   Collection Time: 08/03/23  8:50 AM  Result Value Ref Range   HB A1C (BAYER DCA - WAIVED) 5.4 4.8 - 5.6 %  CBC with Differential/Platelet   Collection Time: 08/03/23  8:51 AM  Result Value Ref Range   WBC 5.7 3.4 - 10.8 x10E3/uL   RBC 4.67 3.77 - 5.28 x10E6/uL   Hemoglobin 13.3 11.1 - 15.9 g/dL   Hematocrit 25.3 66.4 - 46.6 %   MCV 89 79 - 97 fL   MCH 28.5 26.6 - 33.0 pg   MCHC  31.9 31.5 - 35.7 g/dL   RDW 40.3 47.4 - 25.9 %   Platelets 225 150 - 450 x10E3/uL   Neutrophils 56 Not Estab. %   Lymphs 32 Not Estab. %   Monocytes 8 Not Estab. %   Eos 3 Not Estab. %   Basos 1 Not Estab. %   Neutrophils Absolute 3.2 1.4 - 7.0 x10E3/uL   Lymphocytes Absolute 1.8 0.7 - 3.1 x10E3/uL   Monocytes Absolute 0.5 0.1 - 0.9 x10E3/uL   EOS (ABSOLUTE) 0.2  0.0 - 0.4 x10E3/uL   Basophils Absolute 0.1 0.0 - 0.2 x10E3/uL   Immature Granulocytes 0 Not Estab. %   Immature Grans (Abs) 0.0 0.0 - 0.1 x10E3/uL  Comprehensive metabolic panel   Collection Time: 08/03/23  8:51 AM  Result Value Ref Range   Glucose 89 70 - 99 mg/dL   BUN 15 8 - 27 mg/dL   Creatinine, Ser 4.78 0.57 - 1.00 mg/dL   eGFR 80 >29 FA/OZH/0.86   BUN/Creatinine Ratio 18 12 - 28   Sodium 144 134 - 144 mmol/L   Potassium 4.0 3.5 - 5.2 mmol/L   Chloride 106 96 - 106 mmol/L   CO2 23 20 - 29 mmol/L   Calcium 9.1 8.7 - 10.3 mg/dL   Total Protein 7.1 6.0 - 8.5 g/dL   Albumin 3.8 3.8 - 4.9 g/dL   Globulin, Total 3.3 1.5 - 4.5 g/dL   Bilirubin Total 0.7 0.0 - 1.2 mg/dL   Alkaline Phosphatase 86 44 - 121 IU/L   AST 13 0 - 40 IU/L   ALT 8 0 - 32 IU/L  Lipid Panel w/o Chol/HDL Ratio   Collection Time: 08/03/23  8:51 AM  Result Value Ref Range   Cholesterol, Total 176 100 - 199 mg/dL   Triglycerides 68 0 - 149 mg/dL   HDL 74 >57 mg/dL   VLDL Cholesterol Cal 13 5 - 40 mg/dL   LDL Chol Calc (NIH) 89 0 - 99 mg/dL  TSH   Collection Time: 08/03/23  8:51 AM  Result Value Ref Range   TSH 1.200 0.450 - 4.500 uIU/mL      Assessment & Plan:   Problem List Items Addressed This Visit       Cardiovascular and Mediastinum   Primary hypertension   Under good control on current regimen. Continue current regimen. Continue to monitor. Call with any concerns. Refills given. Labs drawn today.       Relevant Medications   amLODipine  (NORVASC ) 5 MG tablet   Other Relevant Orders   Basic metabolic panel with GFR      Nervous and Auditory   Osteoarthritis of spine with radiculopathy, lumbar region   Worsening. Stopped her lyrica  as it wasn't helping. Needs to get back into see neurosurgery- referral generated today.       Relevant Medications   meloxicam  (MOBIC ) 15 MG tablet   Other Relevant Orders   Ambulatory referral to Neurosurgery     Other   Prediabetes   Rechecking labs today. Await results. Treat as needed.      Relevant Orders   Bayer DCA Hb A1c Waived   Other Visit Diagnoses       DOE (dyspnea on exertion)    -  Primary   Abnormal EKG with flipped T-waves V3-6, changed from prior. Will get her back into cardiology. Await their input. Call with any concerns.   Relevant Orders   EKG 12-Lead   CBC with Differential/Platelet        Follow up plan: Return in about 3 months (around 05/03/2024).

## 2024-02-01 NOTE — Assessment & Plan Note (Signed)
 Worsening. Stopped her lyrica  as it wasn't helping. Needs to get back into see neurosurgery- referral generated today.

## 2024-02-01 NOTE — Assessment & Plan Note (Signed)
 Under good control on current regimen. Continue current regimen. Continue to monitor. Call with any concerns. Refills given. Labs drawn today.

## 2024-02-01 NOTE — Patient Instructions (Addendum)
 Cardiology- Dr. Leafy Primrose June 2nd 9:40AM [address], [place], [place] [address] Phone: [phone]

## 2024-02-02 ENCOUNTER — Ambulatory Visit: Payer: Self-pay | Admitting: Family Medicine

## 2024-02-02 LAB — CBC WITH DIFFERENTIAL/PLATELET
Basophils Absolute: 0 10*3/uL (ref 0.0–0.2)
Basos: 1 %
EOS (ABSOLUTE): 0.1 10*3/uL (ref 0.0–0.4)
Eos: 2 %
Hematocrit: 40 % (ref 34.0–46.6)
Hemoglobin: 13.3 g/dL (ref 11.1–15.9)
Immature Grans (Abs): 0 10*3/uL (ref 0.0–0.1)
Immature Granulocytes: 0 %
Lymphocytes Absolute: 2.1 10*3/uL (ref 0.7–3.1)
Lymphs: 37 %
MCH: 29.5 pg (ref 26.6–33.0)
MCHC: 33.3 g/dL (ref 31.5–35.7)
MCV: 89 fL (ref 79–97)
Monocytes Absolute: 0.5 10*3/uL (ref 0.1–0.9)
Monocytes: 9 %
Neutrophils Absolute: 3 10*3/uL (ref 1.4–7.0)
Neutrophils: 51 %
Platelets: 228 10*3/uL (ref 150–450)
RBC: 4.51 x10E6/uL (ref 3.77–5.28)
RDW: 12.7 % (ref 11.7–15.4)
WBC: 5.9 10*3/uL (ref 3.4–10.8)

## 2024-02-02 LAB — BASIC METABOLIC PANEL WITH GFR
BUN/Creatinine Ratio: 21 (ref 12–28)
BUN: 15 mg/dL (ref 8–27)
CO2: 21 mmol/L (ref 20–29)
Calcium: 8.8 mg/dL (ref 8.7–10.3)
Chloride: 108 mmol/L — ABNORMAL HIGH (ref 96–106)
Creatinine, Ser: 0.73 mg/dL (ref 0.57–1.00)
Glucose: 103 mg/dL — ABNORMAL HIGH (ref 70–99)
Potassium: 3.9 mmol/L (ref 3.5–5.2)
Sodium: 143 mmol/L (ref 134–144)
eGFR: 94 mL/min/{1.73_m2} (ref >59)

## 2024-02-07 ENCOUNTER — Encounter: Payer: Self-pay | Admitting: Cardiology

## 2024-02-07 ENCOUNTER — Ambulatory Visit: Attending: Cardiology | Admitting: Cardiology

## 2024-02-07 VITALS — BP 124/80 | HR 73 | Ht 61.0 in | Wt 240.4 lb

## 2024-02-07 DIAGNOSIS — R0609 Other forms of dyspnea: Secondary | ICD-10-CM | POA: Diagnosis not present

## 2024-02-07 DIAGNOSIS — I10 Essential (primary) hypertension: Secondary | ICD-10-CM | POA: Insufficient documentation

## 2024-02-07 NOTE — Progress Notes (Signed)
 Cardiology Office Note:    Date:  02/07/2024   ID:  Allison Dixon, DOB 1960-09-08, MRN [id number]  PCP:  Solomon Dupre, DO  Cardiologist:  None  Electrophysiologist:  None   Referring MD: Solomon Dupre, DO   Chief Complaint  Patient presents with   12 month follow up     Patient c/o shortness of breath with walking a short distance.     History of Present Illness:    Allison Dixon is a 63 y.o. female with a hx of obesity, hypertension, OSA/CPAP nightly who presents due to dyspnea on exertion.  Patient has noticed worsening shortness of breath with exertion especially going up the stairs of her home.  Symptoms have worsened over the past 6 months.  Followed up with PCP, EKG showed new T wave inversions.  She endorsed occasional chest pains.  She denies any changes in weight.  Her blood pressure is well-controlled on current dose of amlodipine .  Prior notes Lexiscan  Myoview  01/2021, no evidence for ischemia, low risk scan.  EF 55% Echo 02/2021, EF 50%, normal systolic and diastolic function.  Past Medical History:  Diagnosis Date   Cancer (HCC) 2021   Uterine    COVID    Endometrial cancer (HCC)    Hypertension    Morbid obesity (HCC)    Sleep apnea     Past Surgical History:  Procedure Laterality Date   DILATION AND CURETTAGE, DIAGNOSTIC / THERAPEUTIC     ESOPHAGOGASTRODUODENOSCOPY (EGD) WITH PROPOFOL  N/A 02/24/2022   Procedure: ESOPHAGOGASTRODUODENOSCOPY (EGD) WITH PROPOFOL ;  Surgeon: Marnee Sink, MD;  Location: [facility] END[facility];  Service: Endoscopy;  Laterality: N/A;   EXPLORATORY LAPAROTOMY     for ectopic pregnancy   FOOT SURGERY Left    TOTAL LAPAROSCOPIC HYSTERECTOMY WITH BILATERAL SALPINGO OOPHORECTOMY Bilateral 05/22/2020   Procedure: TOTAL LAPAROSCOPIC HYSTERECTOMY WITH BILATERAL SALPINGO OOPHORECTOMY AND WASHINGS, PERINEAL BIOPSY;  Surgeon: Hermine Loots, MD;  Location: [facility] ORS;  Service: Gynecology;  Laterality: Bilateral;    Current  Medications: Current Meds  Medication Sig   albuterol  (VENTOLIN  HFA) 108 (90 Base) MCG/ACT inhaler Inhale 2 puffs into the lungs every 6 (six) hours as needed for wheezing or shortness of breath.   amLODipine  (NORVASC ) 5 MG tablet Take 1 tablet (5 mg total) by mouth daily.   fluticasone (FLONASE) 50 MCG/ACT nasal spray Place 2 sprays into both nostrils daily.   meloxicam  (MOBIC ) 15 MG tablet Take 1 tablet (15 mg total) by mouth daily.   omeprazole  (PRILOSEC) 20 MG capsule Take 1 capsule (20 mg total) by mouth daily.     Allergies:   Patient has no known allergies.   Social History   Socioeconomic History   Marital status: Widowed    Spouse name: Not on file   Number of children: 7   Years of education: Not on file   Highest education level: 10th grade  Occupational History   Occupation: disability  Tobacco Use   Smoking status: Never   Smokeless tobacco: Never  Vaping Use   Vaping status: Never Used  Substance and Sexual Activity   Alcohol use: Never   Drug use: Never   Sexual activity: Not Currently    Birth control/protection: Surgical  Other Topics Concern   Not on file  Social History Narrative   Not on file   Social Drivers of Health   Financial Resource Strain: Low Risk  (07/15/2023)   Overall Financial Resource Strain (CARDIA)    Difficulty of Paying Living Expenses:  Not hard at all  Food Insecurity: No Food Insecurity (07/15/2023)   Hunger Vital Sign    Worried About Running Out of Food in the Last Year: Never true    Ran Out of Food in the Last Year: Never true  Recent Concern: Food Insecurity - Food Insecurity Present (05/24/2023)   Hunger Vital Sign    Worried About Running Out of Food in the Last Year: Sometimes true    Ran Out of Food in the Last Year: Sometimes true  Transportation Needs: No Transportation Needs (07/15/2023)   PRAPARE - Administrator, Civil Service (Medical): No    Lack of Transportation (Non-Medical): No  Physical Activity:  Inactive (07/15/2023)   Exercise Vital Sign    Days of Exercise per Week: 0 days    Minutes of Exercise per Session: 0 min  Stress: No Stress Concern Present (07/15/2023)   [employer] of Occupational Health - Occupational Stress Questionnaire    Feeling of Stress : Only a little  Recent Concern: Stress - Stress Concern Present (05/24/2023)   [employer] of Occupational Health - Occupational Stress Questionnaire    Feeling of Stress : To some extent  Social Connections: Moderately Isolated (07/15/2023)   Social Connection and Isolation Panel [NHANES]    Frequency of Communication with Friends and Family: More than three times a week    Frequency of Social Gatherings with Friends and Family: More than three times a week    Attends Religious Services: More than 4 times per year    Active Member of [employer] or Organizations: No    Attends Banker Meetings: Never    Marital Status: Widowed     Family History: The patient's family history includes Cancer in her father and mother; Heart attack in her mother. There is no history of Breast cancer.  ROS:   Please see the history of present illness.     All other systems reviewed and are negative.  EKGs/Labs/Other Studies Reviewed:    The following studies were reviewed today:   EKG Interpretation Date/Time:  Monday February 07 2024 09:43:06 EDT Ventricular Rate:  73 PR Interval:  172 QRS Duration:  80 QT Interval:  416 QTC Calculation: 458 R Axis:   1  Text Interpretation: Normal sinus rhythm T wave abnormality, consider anterolateral ischemia Confirmed by Constancia Delton ([id number]) on 02/07/2024 9:49:03 AM    Recent Labs: 08/03/2023: ALT 8; TSH 1.200 02/01/2024: BUN 15; Creatinine, Ser 0.73; Hemoglobin 13.3; Platelets 228; Potassium 3.9; Sodium 143  Recent Lipid Panel    Component Value Date/Time   CHOL 176 08/03/2023 0851   TRIG 68 08/03/2023 0851   HDL 74 08/03/2023 0851   LDLCALC 89 08/03/2023 0851     Physical Exam:    VS:  BP 124/80 (BP Location: Left Arm, Patient Position: Sitting, Cuff Size: Large)   Pulse 73   Ht 5\' 1"  (1.549 m)   Wt 240 lb 6 oz (109 kg)   LMP  (LMP Unknown)   SpO2 98%   BMI 45.42 kg/m     Wt Readings from Last 3 Encounters:  02/07/24 240 lb 6 oz (109 kg)  02/01/24 242 lb 12.8 oz (110.1 kg)  08/03/23 234 lb 6.4 oz (106.3 kg)     GEN:  Well nourished, well developed in no acute distress, obese HEENT: Normal NECK: No JVD; No carotid bruits CARDIAC: RRR, no murmurs, rubs, gallops RESPIRATORY:  Clear to auscultation without rales, wheezing or rhonchi  ABDOMEN:  Soft, non-tender, distended MUSCULOSKELETAL:  No edema; No deformity  SKIN: Warm and dry NEUROLOGIC:  Alert and oriented x 3 PSYCHIATRIC:  Normal affect   ASSESSMENT:    1. Dyspnea on exertion   2. Essential hypertension   3. Morbid obesity (HCC)    PLAN:    In order of problems listed above:   Dyspnea on exertion, etiology likely morbid obesity.  Echo 2022 EF 50%.  Repeat echocardiogram.  Obtain Lexiscan  Myoview  to rule out ischemia. Hypertension, blood pressure controlled, continue amlodipine  5 mg daily. Morbid obesity, low-calorie diet, weight loss advised.  Follow-up after cardiac testing   Informed Consent   Shared Decision Making/Informed Consent The risks [chest pain, shortness of breath, cardiac arrhythmias, dizziness, blood pressure fluctuations, myocardial infarction, stroke/transient ischemic attack, nausea, vomiting, allergic reaction, radiation exposure, metallic taste sensation and life-threatening complications (estimated to be 1 in 10,000)], benefits (risk stratification, diagnosing coronary artery disease, treatment guidance) and alternatives of a nuclear stress test were discussed in detail with Ms. Abundis and she agrees to proceed.       This note was generated in part or whole with voice recognition software. Voice recognition is usually quite accurate but there are  transcription errors that can and very often do occur. I apologize for any typographical errors that were not detected and corrected.  Medication Adjustments/Labs and Tests Ordered: Current medicines are reviewed at length with the patient today.  Concerns regarding medicines are outlined above.  Orders Placed This Encounter  Procedures   NM Myocar Multi W/Spect W/Wall Motion / EF   EKG 12-Lead   ECHOCARDIOGRAM COMPLETE    No orders of the defined types were placed in this encounter.    Patient Instructions  Medication Instructions:   Your physician recommends that you continue on your current medications as directed. Please refer to the Current Medication list given to you today.  *If you need a refill on your cardiac medications before your next appointment, please call your pharmacy*  Lab Work:  No labs ordered today   If you have labs (blood work) drawn today and your tests are completely normal, you will receive your results only by: MyChart Message (if you have MyChart) OR A paper copy in the mail If you have any lab test that is abnormal or we need to change your treatment, we will call you to review the results.  Testing/Procedures:  Your physician has requested that you have an echocardiogram. Echocardiography is a painless test that uses sound waves to create images of your heart. It provides your doctor with information about the size and shape of your heart and how well your heart's chambers and valves are working.   You may receive an ultrasound enhancing agent through an IV if needed to better visualize your heart during the echo. This procedure takes approximately one hour.  There are no restrictions for this procedure.  This will take place at [address] [facility] [address] [facility] [address]) [address], [place] [address]  Please note: We ask at that you not bring children with you during ultrasound (echo/ vascular) testing. Due to room size and safety concerns, children  are not allowed in the ultrasound rooms during exams. Our front office staff cannot provide observation of children in our lobby area while testing is being conducted. An adult accompanying a patient to their appointment will only be allowed in the ultrasound room at the discretion of the ultrasound technician under special circumstances. We apologize for any inconvenience.   Your  provider has ordered a Lexiscan / Exercise Myoview  Stress test. This will take place at [facility]. Please report to the [facility] medical mall entrance. The volunteers at the first desk will direct you where to go.  [facility] MYOVIEW   Your provider has ordered a Stress Test with nuclear imaging. The purpose of this test is to evaluate the blood supply to your heart muscle. This procedure is referred to as a "Non-Invasive Stress Test." This is because other than having an IV started in your vein, nothing is inserted or "invades" your body. Cardiac stress tests are done to find areas of poor blood flow to the heart by determining the extent of coronary artery disease (CAD). Some patients exercise on a treadmill, which naturally increases the blood flow to your heart, while others who are unable to walk on a treadmill due to physical limitations will have a pharmacologic/chemical stress agent called Lexiscan  . This medicine will mimic walking on a treadmill by temporarily increasing your coronary blood flow.   Please note: these test may take anywhere between 2-4 hours to complete  How to prepare for your Myoview  test:  Nothing to eat for 6 hours prior to the test No caffeine for 24 hours prior to test No smoking 24 hours prior to test. Your medication may be taken with water.  If your doctor stopped a medication because of this test, do not take that medication. Ladies, please do not wear dresses.  Skirts or pants are appropriate. Please wear a short sleeve shirt. No perfume, cologne or lotion. Wear comfortable walking shoes. No  heels!   PLEASE NOTIFY THE OFFICE AT LEAST 24 HOURS IN ADVANCE IF YOU ARE UNABLE TO KEEP YOUR APPOINTMENT.  [phone] AND  PLEASE NOTIFY NUCLEAR MEDICINE AT [facility] AT LEAST 24 HOURS IN ADVANCE IF YOU ARE UNABLE TO KEEP YOUR APPOINTMENT. [phone]   Follow-Up: At [facility], you and your health needs are our priority.  As part of our continuing mission to provide you with exceptional heart care, our providers are all part of one team.  This team includes your primary Cardiologist (physician) and Advanced Practice Providers or APPs (Physician Assistants and Nurse Practitioners) who all work together to provide you with the care you need, when you need it.  Your next appointment:    2-3 month(s)  Provider:   You may see Constancia Delton  or one of the following Advanced Practice Providers on your designated Care Team:   Laneta Pintos, NP Gildardo Labrador, PA-C Varney Gentleman, PA-C Cadence Hauula, PA-C Ronald Cockayne, NP Morey Ar, NP   We recommend signing up for the patient portal called "MyChart".  Sign up information is provided on this After Visit Summary.  MyChart is used to connect with patients for Virtual Visits (Telemedicine).  Patients are able to view lab/test results, encounter notes, upcoming appointments, etc.  Non-urgent messages can be sent to your provider as well.   To learn more about what you can do with MyChart, go to [email or web address].          Signed, Constancia Delton, MD  02/07/2024 11:21 AM    [facility]

## 2024-02-07 NOTE — Patient Instructions (Signed)
 Medication Instructions:   Your physician recommends that you continue on your current medications as directed. Please refer to the Current Medication list given to you today.  *If you need a refill on your cardiac medications before your next appointment, please call your pharmacy*  Lab Work:  No labs ordered today   If you have labs (blood work) drawn today and your tests are completely normal, you will receive your results only by: MyChart Message (if you have MyChart) OR A paper copy in the mail If you have any lab test that is abnormal or we need to change your treatment, we will call you to review the results.  Testing/Procedures:  Your physician has requested that you have an echocardiogram. Echocardiography is a painless test that uses sound waves to create images of your heart. It provides your doctor with information about the size and shape of your heart and how well your heart's chambers and valves are working.   You may receive an ultrasound enhancing agent through an IV if needed to better visualize your heart during the echo. This procedure takes approximately one hour.  There are no restrictions for this procedure.  This will take place at [address] [facility] [address]) [address], [place] [address]  Please note: We ask at that you not bring children with you during ultrasound (echo/ vascular) testing. Due to room size and safety concerns, children are not allowed in the ultrasound rooms during exams. Our front office staff cannot provide observation of children in our lobby area while testing is being conducted. An adult accompanying a patient to their appointment will only be allowed in the ultrasound room at the discretion of the ultrasound technician under special circumstances. We apologize for any inconvenience.   Your provider has ordered a Lexiscan / Exercise Myoview  Stress test. This will take place at [facility]. Please report to the [facility] medical mall entrance. The  volunteers at the first desk will direct you where to go.  [facility] MYOVIEW   Your provider has ordered a Stress Test with nuclear imaging. The purpose of this test is to evaluate the blood supply to your heart muscle. This procedure is referred to as a "Non-Invasive Stress Test." This is because other than having an IV started in your vein, nothing is inserted or "invades" your body. Cardiac stress tests are done to find areas of poor blood flow to the heart by determining the extent of coronary artery disease (CAD). Some patients exercise on a treadmill, which naturally increases the blood flow to your heart, while others who are unable to walk on a treadmill due to physical limitations will have a pharmacologic/chemical stress agent called Lexiscan  . This medicine will mimic walking on a treadmill by temporarily increasing your coronary blood flow.   Please note: these test may take anywhere between 2-4 hours to complete  How to prepare for your Myoview  test:  Nothing to eat for 6 hours prior to the test No caffeine for 24 hours prior to test No smoking 24 hours prior to test. Your medication may be taken with water.  If your doctor stopped a medication because of this test, do not take that medication. Ladies, please do not wear dresses.  Skirts or pants are appropriate. Please wear a short sleeve shirt. No perfume, cologne or lotion. Wear comfortable walking shoes. No heels!   PLEASE NOTIFY THE OFFICE AT LEAST 24 HOURS IN ADVANCE IF YOU ARE UNABLE TO KEEP YOUR APPOINTMENT.  [phone] AND  PLEASE NOTIFY  NUCLEAR MEDICINE AT [facility] AT LEAST 24 HOURS IN ADVANCE IF YOU ARE UNABLE TO KEEP YOUR APPOINTMENT. [phone]   Follow-Up: At [facility], you and your health needs are our priority.  As part of our continuing mission to provide you with exceptional heart care, our providers are all part of one team.  This team includes your primary Cardiologist (physician) and Advanced Practice  Providers or APPs (Physician Assistants and Nurse Practitioners) who all work together to provide you with the care you need, when you need it.  Your next appointment:    2-3 month(s)  Provider:   You may see Constancia Delton  or one of the following Advanced Practice Providers on your designated Care Team:   Laneta Pintos, NP Gildardo Labrador, PA-C Varney Gentleman, PA-C Cadence Lowell, PA-C Ronald Cockayne, NP Morey Ar, NP   We recommend signing up for the patient portal called "MyChart".  Sign up information is provided on this After Visit Summary.  MyChart is used to connect with patients for Virtual Visits (Telemedicine).  Patients are able to view lab/test results, encounter notes, upcoming appointments, etc.  Non-urgent messages can be sent to your provider as well.   To learn more about what you can do with MyChart, go to [email or web address].

## 2024-02-17 NOTE — Progress Notes (Unsigned)
 Referring Physician:  Solomon Dupre, DO [address] [place],  [place] [address]  Primary Physician:  Solomon Dupre, DO  History of Present Illness: 02/21/2024 [name] has a history of HTN, OSA, prediabetes, obesity, history of endometrial CA.   She has constant LBP with right posterior leg pain to her foot x years. No left leg pain. She has tingling, no numbness or weakness in her right leg. Pain is worse with walking and sitting. Some relief with laying flat.   No relief with lyrica  in the past. She is on mobic  with minimal relief.   She does not smoke.   Bowel/Bladder Dysfunction: none  Conservative measures:  Physical therapy: has not participated in PT  Multimodal medical therapy including regular antiinflammatories: Meloxicam , lyrica  Injections:  Thinks she had lumbar ESIs around 2021 in [facility] 07/20/17: Right L5/S1 transforaminal epidural Steroid injection by Dr Roddy Citizen at [facility]  05/14/17:  Right L5/S1 transforaminal epidural Steroid injection by Dr Telhan at [facility]  01/07/15: left L4\L5 or L5\S1 facet joint injections under fluoroscopy by Dr Orson Blalock at [facility]   Past Surgery: none  [name] has no symptoms of cervical myelopathy.  The symptoms are causing a significant impact on the patient's life.   Review of Systems:  A 10 point review of systems is negative, except for the pertinent positives and negatives detailed in the HPI.  Past Medical History: Past Medical History:  Diagnosis Date   Cancer (HCC) 2021   Uterine    COVID    Endometrial cancer (HCC)    Hypertension    Morbid obesity (HCC)    Sleep apnea     Past Surgical History: Past Surgical History:  Procedure Laterality Date   DILATION AND CURETTAGE, DIAGNOSTIC / THERAPEUTIC     ESOPHAGOGASTRODUODENOSCOPY (EGD) WITH PROPOFOL  N/A 02/24/2022   Procedure: ESOPHAGOGASTRODUODENOSCOPY (EGD) WITH PROPOFOL ;  Surgeon: Marnee Sink, MD;  Location: [facility] ENDOSCOPY;  Service: Endoscopy;  Laterality: N/A;    EXPLORATORY LAPAROTOMY     for ectopic pregnancy   FOOT SURGERY Left    TOTAL LAPAROSCOPIC HYSTERECTOMY WITH BILATERAL SALPINGO OOPHORECTOMY Bilateral 05/22/2020   Procedure: TOTAL LAPAROSCOPIC HYSTERECTOMY WITH BILATERAL SALPINGO OOPHORECTOMY AND WASHINGS, PERINEAL BIOPSY;  Surgeon: Hermine Loots, MD;  Location: [facility] ORS;  Service: Gynecology;  Laterality: Bilateral;    Allergies: Allergies as of 02/21/2024   (No Known Allergies)    Medications: Outpatient Encounter Medications as of 02/21/2024  Medication Sig   albuterol  (VENTOLIN  HFA) 108 (90 Base) MCG/ACT inhaler Inhale 2 puffs into the lungs every 6 (six) hours as needed for wheezing or shortness of breath.   amLODipine  (NORVASC ) 5 MG tablet Take 1 tablet (5 mg total) by mouth daily.   fluticasone (FLONASE) 50 MCG/ACT nasal spray Place 2 sprays into both nostrils daily.   meloxicam  (MOBIC ) 15 MG tablet Take 1 tablet (15 mg total) by mouth daily.   omeprazole  (PRILOSEC) 20 MG capsule Take 1 capsule (20 mg total) by mouth daily.   No facility-administered encounter medications on file as of 02/21/2024.    Social History: Social History   Tobacco Use   Smoking status: Never   Smokeless tobacco: Never  Vaping Use   Vaping status: Never Used  Substance Use Topics   Alcohol use: Never   Drug use: Never    Family Medical History: Family History  Problem Relation Age of Onset   Heart attack Mother    Cancer Mother    Cancer Father    Breast  cancer Neg Hx     Physical Examination: Vitals:   02/21/24 0836  BP: 116/72    General: Patient is well developed, well nourished, calm, collected, and in no apparent distress. Attention to examination is appropriate.  Respiratory: Patient is breathing without any difficulty.   NEUROLOGICAL:     Awake, alert, oriented to person, place, and time.  Speech is clear and fluent. Fund of knowledge is appropriate.   Cranial Nerves: Pupils equal round and reactive to light.   Facial tone is symmetric.    Diffuse lower posterior lumbar tenderness.   No abnormal lesions on exposed skin.   Strength: Side Biceps Triceps Deltoid Interossei Grip Wrist Ext. Wrist Flex.  R 5 5 5 5 5 5 5   L 5 5 5 5 5 5 5    Side Iliopsoas Quads Hamstring PF DF EHL  R 5 5 5 5 5 5   L 5 5 5 5 5 5    Reflexes are 2+ and symmetric at the biceps, brachioradialis, patella and achilles.   Hoffman's is absent.  Clonus is not present.   Bilateral upper and lower extremity sensation is intact to light touch.     No pain with IR/ER of both hips.   Gait is normal.     Medical Decision Making  Imaging: Lumbar xrays dated 01/27/22:  FINDINGS: There are 5 non-rib-bearing lumbar-type vertebral bodies. 8 mm grade 1 anterolisthesis of L4 on L5, unchanged from prior. Moderate L5-S1 disc space narrowing with 7 mm grade 1 anterolisthesis, unchanged.   Minimal posterior L4-5 disc space narrowing.   Vertebral body heights are maintained.   Moderate T11-12 and mild T12-L1 disc space narrowing with moderate anterior T11-12 endplate osteophytosis.   Mild subchondral sclerosis and degenerative vacuum phenomenon within the bilateral sacroiliac joints.   Numerous vascular phleboliths overlie the pelvis.   IMPRESSION: 1. No significant change from prior. 2. Grade 1 anterolisthesis of L4 on L5 and L5 on S1. 3. Moderate L5-S1 degenerative disc and endplate changes.     Electronically Signed   By: Bertina Broccoli M.D.   On: 01/27/2022 10:52   Lumbar MRI dated 04/05/21:  FINDINGS: Segmentation:  Standard.   Alignment:  5 mm of grade 1 anterolisthesis L4 on L5 and L5 on S1.   Vertebrae:  No fracture, evidence of discitis, or bone lesion.   Conus medullaris and cauda equina: Conus extends to the L1-2 level. Conus and cauda equina appear normal.   Paraspinal and other soft tissues: Negative.   Disc levels:   T11-T12: Sagittal sequences only. Disc height loss with mild circumferential  disc bulge. Suspect mild canal stenosis and mild-moderate right foraminal stenosis.   T12-L1: Minimal circumferential disc bulge. Mild bilateral facet hypertrophy. No foraminal or canal stenosis.   L1-L2: No disc protrusion. Mild bilateral facet hypertrophy. No foraminal or canal stenosis.   L2-L3: No disc protrusion. Mild bilateral facet hypertrophy. Prominence of the posterior epidural fat. No foraminal or canal stenosis.   L3-L4: Mild annular disc bulge. Moderate bilateral facet hypertrophy. Prominence of the posterior epidural fat. No foraminal or canal stenosis.   L4-L5: Anterolisthesis with disc uncovering. Mild left paracentral disc protrusion. Severe bilateral facet arthropathy with trace bilateral facet joint effusions. No canal stenosis. Moderate left and mild right foraminal stenosis.   L5-S1: Above the L5-S1 disc level there is prominence of the epidural fat anterior to the L5 vertebral body, left paramedian (series 14, image 28) as well as ligamentum flavum infolding resulting in mild canal stenosis.  At the L5-S1 disc level, there is grade 1 anterolisthesis with disc uncovering and diffuse disc osteophyte complex. Severe bilateral facet arthropathy. Mild bilateral foraminal stenosis without canal stenosis.   IMPRESSION: 1. Multilevel lumbar spondylosis including grade 1 anterolisthesis of L4 on L5 and L5 on S1 secondary to degenerative facet arthropathy. 2. Mild canal stenosis just above the L5-S1 disc level due to prominent epidural fat anterior to the L5 vertebral body and ligamentum flavum buckling. 3. Moderate left and mild right foraminal stenosis at L4-L5. 4. Mild bilateral foraminal stenosis at L5-S1. 5. Mild canal stenosis and mild-moderate right foraminal stenosis at T11-T12.     Electronically Signed   By: Leverne Reading D.O.   On: 04/08/2021 10:25  I have personally reviewed the images and agree with the above interpretation.  Assessment and  Plan: Ms. Jamil has constant LBP with right posterior leg pain to her foot x years. No left leg pain. She has tingling, no numbness or weakness in her right leg.   Imaging from 2022 and 2023 showed slip L4-L5 and L5-S1 with multilevel foraminal stenosis.   Treatment options discussed with patient and following plan made:   - MRI of lumbar spine to further evaluate right lumbar radiculopathy.  - Will get lumbar xrays with flex/ext when she gets MRI.  - Discussed trial of another NSAID. She wants to stay on mobic  for now.  - Depending on imaging, may consider PT and/or lumbar injections. - Will schedule phone visit to review MRI results once I get them back.   I spent a total of 35 minutes in face-to-face and non-face-to-face activities related to this patient's care today including review of outside records, review of imaging, review of symptoms, physical exam, discussion of differential diagnosis, discussion of treatment options, and documentation.   Thank you for involving me in the care of this patient.   Lucetta Russel PA-C Dept. of Neurosurgery

## 2024-02-18 ENCOUNTER — Ambulatory Visit: Attending: Cardiology

## 2024-02-18 DIAGNOSIS — R0609 Other forms of dyspnea: Secondary | ICD-10-CM | POA: Insufficient documentation

## 2024-02-18 LAB — ECHOCARDIOGRAM COMPLETE
AR max vel: 1.89 cm2
AV Area VTI: 1.89 cm2
AV Area mean vel: 1.85 cm2
AV Mean grad: 6 mmHg
AV Peak grad: 11.6 mmHg
Ao pk vel: 1.71 m/s
Area-P 1/2: 3.85 cm2
Calc EF: 51.2 %
S' Lateral: 4 cm
Single Plane A2C EF: 55.4 %
Single Plane A4C EF: 51 %

## 2024-02-21 ENCOUNTER — Encounter: Payer: Self-pay | Admitting: Orthopedic Surgery

## 2024-02-21 ENCOUNTER — Ambulatory Visit: Payer: Self-pay | Admitting: Cardiology

## 2024-02-21 ENCOUNTER — Ambulatory Visit (INDEPENDENT_AMBULATORY_CARE_PROVIDER_SITE_OTHER): Admitting: Orthopedic Surgery

## 2024-02-21 VITALS — BP 116/72 | Ht 61.0 in | Wt 239.0 lb

## 2024-02-21 DIAGNOSIS — M47816 Spondylosis without myelopathy or radiculopathy, lumbar region: Secondary | ICD-10-CM

## 2024-02-21 DIAGNOSIS — M48061 Spinal stenosis, lumbar region without neurogenic claudication: Secondary | ICD-10-CM

## 2024-02-21 DIAGNOSIS — M5416 Radiculopathy, lumbar region: Secondary | ICD-10-CM

## 2024-02-21 DIAGNOSIS — M4726 Other spondylosis with radiculopathy, lumbar region: Secondary | ICD-10-CM | POA: Diagnosis not present

## 2024-02-21 DIAGNOSIS — M4316 Spondylolisthesis, lumbar region: Secondary | ICD-10-CM

## 2024-02-21 NOTE — Patient Instructions (Signed)
 It was so nice to see you today. Thank you so much for coming in.    Your imaging from 2022 and 2023 showed wear and tear in your back (arthritis) with a slip at L4-L5 and L5-S1.   I want to get an MRI of your lower back to look into things further. We will get this approved through your insurance and [facility] will call you to schedule the appointment. Ask about your patient responsibility. You do not need to pay this prior to getting MRI, they can bill you.   Remind them to do your back xrays when you get the MRI done.   [facility] (building with the white pillars) is located off of [address]. The address is [address] [place], [place] [address].    After you have the MRI, it takes 14-21 days for me to get the results back. Once I have them, we will call you to schedule a follow up phone visit with me to review them.   Please do not hesitate to call if you have any questions or concerns. You can also message me in MyChart.   Lucetta Russel PA-C [phone]     The physicians and staff at [facility] at [facility] are committed to providing excellent care. You may receive a survey asking for feedback about your experience at our office. We value you your feedback and appreciate you taking the time to to fill it out. The [facility] leadership team is also available to discuss your experience in person, feel free to contact us  [phone].

## 2024-02-29 ENCOUNTER — Ambulatory Visit
Admission: RE | Admit: 2024-02-29 | Discharge: 2024-02-29 | Disposition: A | Discharge status: Home / Self Care | Source: Ambulatory Visit | Attending: Cardiology | Admitting: Cardiology

## 2024-02-29 DIAGNOSIS — R0609 Other forms of dyspnea: Secondary | ICD-10-CM | POA: Insufficient documentation

## 2024-02-29 LAB — NM MYOCAR MULTI W/SPECT W/WALL MOTION / EF
LV dias vol: 140 mL (ref 46–106)
LV sys vol: 69 mL (ref 3.8–5.2)
MPHR: 160 {beats}/min
Nuc Stress EF: 51 %
Peak HR: 83 {beats}/min
Percent HR: 51 %
Rest HR: 66 {beats}/min
Rest Nuclear Isotope Dose: 10.4 mCi
SDS: 0
SRS: 4
SSS: 3
ST Depression (mm): 0 mm
Stress Nuclear Isotope Dose: 32.3 mCi
TID: 0.85

## 2024-02-29 MED ORDER — TECHNETIUM TC 99M TETROFOSMIN IV KIT
32.3200 | PACK | Freq: Once | INTRAVENOUS | Status: AC | PRN
  Administered 2024-02-29: 32.32 via INTRAVENOUS

## 2024-02-29 MED ORDER — TECHNETIUM TC 99M TETROFOSMIN IV KIT
10.3600 | PACK | Freq: Once | INTRAVENOUS | Status: AC | PRN
  Administered 2024-02-29: 10.36 via INTRAVENOUS

## 2024-02-29 MED ORDER — REGADENOSON 0.4 MG/5ML IV SOLN
0.4000 mg | Freq: Once | INTRAVENOUS | Status: AC
  Administered 2024-02-29: 0.4 mg via INTRAVENOUS

## 2024-04-12 ENCOUNTER — Ambulatory Visit: Attending: Cardiology | Admitting: Cardiology

## 2024-04-12 VITALS — BP 110/78 | HR 69 | Ht 62.0 in | Wt 240.8 lb

## 2024-04-12 DIAGNOSIS — I10 Essential (primary) hypertension: Secondary | ICD-10-CM | POA: Diagnosis present

## 2024-04-12 DIAGNOSIS — R0609 Other forms of dyspnea: Secondary | ICD-10-CM | POA: Insufficient documentation

## 2024-04-12 NOTE — Patient Instructions (Signed)
 Medication Instructions:  Your physician recommends that you continue on your current medications as directed. Please refer to the Current Medication list given to you today.   *If you need a refill on your cardiac medications before your next appointment, please call your pharmacy*  Lab Work: No labs ordered today  If you have labs (blood work) drawn today and your tests are completely normal, you will receive your results only by: MyChart Message (if you have MyChart) OR A paper copy in the mail If you have any lab test that is abnormal or we need to change your treatment, we will call you to review the results.  Testing/Procedures: No test ordered today   Follow-Up: At [facility], you and your health needs are our priority.  As part of our continuing mission to provide you with exceptional heart care, our providers are all part of one team.  This team includes your primary Cardiologist (physician) and Advanced Practice Providers or APPs (Physician Assistants and Nurse Practitioners) who all work together to provide you with the care you need, when you need it.  Your next appointment:   As needed

## 2024-04-12 NOTE — Progress Notes (Signed)
 Cardiology Office Note:    Date:  04/12/2024   ID:  Allison Dixon, DOB 1960-09-12, MRN [id number]  PCP:  Vicci Duwaine SQUIBB, DO  Cardiologist:  None  Electrophysiologist:  None   Referring MD: Vicci Duwaine SQUIBB, DO   Chief Complaint  Patient presents with   testing follow up    No complaints at this time.     History of Present Illness:    Allison Dixon is a 63 y.o. female with a hx of obesity, hypertension, OSA/CPAP nightly who presents for follow-up.  She was last seen due to dyspnea on exertion.  Echocardiogram Lexiscan  Myoview  obtained to evaluate any cardiac etiology for shortness of breath.  Patient states doing okay, no new concerns at this time.  Presents for testing results.  Prior notes Lexiscan  Myoview  01/2021, no evidence for ischemia, low risk scan.  EF 55% Echo 02/2021, EF 50%, normal systolic and diastolic function.  Past Medical History:  Diagnosis Date   Cancer (HCC) 2021   Uterine    COVID    Endometrial cancer (HCC)    Hypertension    Morbid obesity (HCC)    Sleep apnea     Past Surgical History:  Procedure Laterality Date   DILATION AND CURETTAGE, DIAGNOSTIC / THERAPEUTIC     ESOPHAGOGASTRODUODENOSCOPY (EGD) WITH PROPOFOL  N/A 02/24/2022   Procedure: ESOPHAGOGASTRODUODENOSCOPY (EGD) WITH PROPOFOL ;  Surgeon: Jinny Carmine, MD;  Location: [facility] ENDOSCOPY;  Service: Endoscopy;  Laterality: N/A;   EXPLORATORY LAPAROTOMY     for ectopic pregnancy   FOOT SURGERY Left    TOTAL LAPAROSCOPIC HYSTERECTOMY WITH BILATERAL SALPINGO OOPHORECTOMY Bilateral 05/22/2020   Procedure: TOTAL LAPAROSCOPIC HYSTERECTOMY WITH BILATERAL SALPINGO OOPHORECTOMY AND WASHINGS, PERINEAL BIOPSY;  Surgeon: Mancil Barter, MD;  Location: [facility] ORS;  Service: Gynecology;  Laterality: Bilateral;    Current Medications: Current Meds  Medication Sig   albuterol  (VENTOLIN  HFA) 108 (90 Base) MCG/ACT inhaler Inhale 2 puffs into the lungs every 6 (six) hours as needed for wheezing or shortness of  breath.   amLODipine  (NORVASC ) 5 MG tablet Take 1 tablet (5 mg total) by mouth daily.   fluticasone (FLONASE) 50 MCG/ACT nasal spray Place 2 sprays into both nostrils daily.   meloxicam  (MOBIC ) 15 MG tablet Take 1 tablet (15 mg total) by mouth daily.   omeprazole  (PRILOSEC) 20 MG capsule Take 1 capsule (20 mg total) by mouth daily.     Allergies:   Patient has no known allergies.   Social History   Socioeconomic History   Marital status: Widowed    Spouse name: Not on file   Number of children: 7   Years of education: Not on file   Highest education level: 10th grade  Occupational History   Occupation: disability  Tobacco Use   Smoking status: Never   Smokeless tobacco: Never  Vaping Use   Vaping status: Never Used  Substance and Sexual Activity   Alcohol use: Never   Drug use: Never   Sexual activity: Not Currently    Birth control/protection: Surgical  Other Topics Concern   Not on file  Social History Narrative   Not on file   Social Drivers of Health   Financial Resource Strain: Low Risk  (07/15/2023)   Overall Financial Resource Strain (CARDIA)    Difficulty of Paying Living Expenses: Not hard at all  Food Insecurity: No Food Insecurity (07/15/2023)   Hunger Vital Sign    Worried About Running Out of Food in the Last Year: Never true  Ran Out of Food in the Last Year: Never true  Recent Concern: Food Insecurity - Food Insecurity Present (05/24/2023)   Hunger Vital Sign    Worried About Running Out of Food in the Last Year: Sometimes true    Ran Out of Food in the Last Year: Sometimes true  Transportation Needs: No Transportation Needs (07/15/2023)   PRAPARE - Administrator, Civil Service (Medical): No    Lack of Transportation (Non-Medical): No  Physical Activity: Inactive (07/15/2023)   Exercise Vital Sign    Days of Exercise per Week: 0 days    Minutes of Exercise per Session: 0 min  Stress: No Stress Concern Present (07/15/2023)   [employer] of Occupational Health - Occupational Stress Questionnaire    Feeling of Stress : Only a little  Recent Concern: Stress - Stress Concern Present (05/24/2023)   [employer] of Occupational Health - Occupational Stress Questionnaire    Feeling of Stress : To some extent  Social Connections: Moderately Isolated (07/15/2023)   Social Connection and Isolation Panel    Frequency of Communication with Friends and Family: More than three times a week    Frequency of Social Gatherings with Friends and Family: More than three times a week    Attends Religious Services: More than 4 times per year    Active Member of [employer] or Organizations: No    Attends Banker Meetings: Never    Marital Status: Widowed     Family History: The patient's family history includes Cancer in her father and mother; Heart attack in her mother. There is no history of Breast cancer.  ROS:   Please see the history of present illness.     All other systems reviewed and are negative.  EKGs/Labs/Other Studies Reviewed:    The following studies were reviewed today:        Recent Labs: 08/03/2023: ALT 8; TSH 1.200 02/01/2024: BUN 15; Creatinine, Ser 0.73; Hemoglobin 13.3; Platelets 228; Potassium 3.9; Sodium 143  Recent Lipid Panel    Component Value Date/Time   CHOL 176 08/03/2023 0851   TRIG 68 08/03/2023 0851   HDL 74 08/03/2023 0851   LDLCALC 89 08/03/2023 0851    Physical Exam:    VS:  BP 110/78 (BP Location: Left Arm, Patient Position: Sitting, Cuff Size: Large)   Pulse 69   Ht 5' 2 (1.575 m)   Wt 240 lb 12.8 oz (109.2 kg)   LMP  (LMP Unknown)   SpO2 98%   BMI 44.04 kg/m     Wt Readings from Last 3 Encounters:  04/12/24 240 lb 12.8 oz (109.2 kg)  02/21/24 239 lb (108.4 kg)  02/07/24 240 lb 6 oz (109 kg)     GEN:  Well nourished, well developed in no acute distress, obese HEENT: Normal NECK: No JVD; No carotid bruits CARDIAC: RRR, no murmurs, rubs,  gallops RESPIRATORY:  Clear to auscultation without rales, wheezing or rhonchi  ABDOMEN: Soft, non-tender, distended MUSCULOSKELETAL:  No edema; No deformity  SKIN: Warm and dry NEUROLOGIC:  Alert and oriented x 3 PSYCHIATRIC:  Normal affect   ASSESSMENT:    1. Dyspnea on exertion   2. Essential hypertension   3. Morbid obesity (HCC)     PLAN:    In order of problems listed above:   Dyspnea on exertion, echo 02/2024 EF 50 to 55%.  Lexiscan  Myoview  6/25 no significant ischemia, low risk study etiology likely morbid obesity.  Hypertension, blood pressure controlled,  continue amlodipine  5 mg daily. Morbid obesity, low-calorie diet, weight loss advised.  Follow-up as needed.       This note was generated in part or whole with voice recognition software. Voice recognition is usually quite accurate but there are transcription errors that can and very often do occur. I apologize for any typographical errors that were not detected and corrected.  Medication Adjustments/Labs and Tests Ordered: Current medicines are reviewed at length with the patient today.  Concerns regarding medicines are outlined above.  No orders of the defined types were placed in this encounter.   No orders of the defined types were placed in this encounter.    Patient Instructions  Medication Instructions:  Your physician recommends that you continue on your current medications as directed. Please refer to the Current Medication list given to you today.   *If you need a refill on your cardiac medications before your next appointment, please call your pharmacy*  Lab Work: No labs ordered today  If you have labs (blood work) drawn today and your tests are completely normal, you will receive your results only by: MyChart Message (if you have MyChart) OR A paper copy in the mail If you have any lab test that is abnormal or we need to change your treatment, we will call you to review the  results.  Testing/Procedures: No test ordered today   Follow-Up: At [facility], you and your health needs are our priority.  As part of our continuing mission to provide you with exceptional heart care, our providers are all part of one team.  This team includes your primary Cardiologist (physician) and Advanced Practice Providers or APPs (Physician Assistants and Nurse Practitioners) who all work together to provide you with the care you need, when you need it.  Your next appointment:   As needed         Signed, Redell Cave, MD  04/12/2024 9:51 AM    [facility]

## 2024-05-04 ENCOUNTER — Ambulatory Visit (INDEPENDENT_AMBULATORY_CARE_PROVIDER_SITE_OTHER): Admitting: Family Medicine

## 2024-05-04 ENCOUNTER — Encounter: Payer: Self-pay | Admitting: Family Medicine

## 2024-05-04 VITALS — BP 124/74 | HR 65 | Temp 98.0°F | Ht 62.0 in | Wt 242.8 lb

## 2024-05-04 DIAGNOSIS — M79601 Pain in right arm: Secondary | ICD-10-CM

## 2024-05-04 DIAGNOSIS — M4726 Other spondylosis with radiculopathy, lumbar region: Secondary | ICD-10-CM | POA: Diagnosis not present

## 2024-05-04 DIAGNOSIS — R0609 Other forms of dyspnea: Secondary | ICD-10-CM | POA: Diagnosis not present

## 2024-05-04 DIAGNOSIS — M79602 Pain in left arm: Secondary | ICD-10-CM

## 2024-05-04 DIAGNOSIS — K219 Gastro-esophageal reflux disease without esophagitis: Secondary | ICD-10-CM | POA: Insufficient documentation

## 2024-05-04 MED ORDER — OMEPRAZOLE 40 MG PO CPDR: 40.0000 mg | DELAYED_RELEASE_CAPSULE | Freq: Every day | ORAL | 1 refills | Status: DC

## 2024-05-04 NOTE — Assessment & Plan Note (Signed)
 Will increase her omeprazole  to 40mg . Call with any concerns. Continue to monitor.

## 2024-05-04 NOTE — Progress Notes (Signed)
 BP 124/74 (BP Location: Left Arm, Cuff Size: Large)   Pulse 65   Temp 98 F (36.7 C) (Oral)   Ht 5' 2 (1.575 m)   Wt 242 lb 12.8 oz (110.1 kg)   LMP  (LMP Unknown)   SpO2 96%   BMI 44.41 kg/m    Subjective:    Patient ID: Allison Dixon, female    DOB: 1961-08-24, 63 y.o.   MRN: [id number]  HPI: Allison Dixon is a 63 y.o. female  Chief Complaint  Patient presents with   Hypertension   Shortness of Breath    On exertion    Not feeling great today. Feels like she has something stuck in her throat. She notes that her acid reflux has not been doing well. She has been feeling like her heartburn has been acting up a lot. Has been taking her omeprazole  but it doesn't seem to be doing enough.   Saw cardiology for stress test- which was negative. She notes that she continues to have issues with DOE.   NUMBNESS Duration: months Onset: gradual Location: bilateral arms Bilateral: yes Symmetric: yes Decreased sensation: yes  Weakness: no Pain: yes Quality:  numb and tingling Severity: moderate  Frequency: constant Trauma: no Recent illness: no Diabetes: no Thyroid  disease: no  HIV: no  Alcoholism: no  Spinal cord injury: no Status: worse Treatments attempted: lyrica  and gabapentin  with bad reactions.    Relevant past medical, surgical, family and social history reviewed and updated as indicated. Interim medical history since our last visit reviewed. Allergies and medications reviewed and updated.  Review of Systems  Constitutional: Negative.   Respiratory:  Positive for shortness of breath. Negative for apnea, cough, choking, chest tightness, wheezing and stridor.   Cardiovascular: Negative.   Gastrointestinal: Negative.   Musculoskeletal:  Positive for back pain and neck pain. Negative for arthralgias, gait problem, joint swelling, myalgias and neck stiffness.  Skin: Negative.   Psychiatric/Behavioral: Negative.      Per HPI unless specifically indicated above      Objective:    BP 124/74 (BP Location: Left Arm, Cuff Size: Large)   Pulse 65   Temp 98 F (36.7 C) (Oral)   Ht 5' 2 (1.575 m)   Wt 242 lb 12.8 oz (110.1 kg)   LMP  (LMP Unknown)   SpO2 96%   BMI 44.41 kg/m   Wt Readings from Last 3 Encounters:  05/04/24 242 lb 12.8 oz (110.1 kg)  04/12/24 240 lb 12.8 oz (109.2 kg)  02/21/24 239 lb (108.4 kg)    Physical Exam Vitals and nursing note reviewed.  Constitutional:      General: She is not in acute distress.    Appearance: Normal appearance. She is not ill-appearing, toxic-appearing or diaphoretic.  HENT:     Head: Normocephalic and atraumatic.     Right Ear: External ear normal.     Left Ear: External ear normal.     Nose: Nose normal.     Mouth/Throat:     Mouth: Mucous membranes are moist.     Pharynx: Oropharynx is clear.  Eyes:     General: No scleral icterus.       Right eye: No discharge.        Left eye: No discharge.     Extraocular Movements: Extraocular movements intact.     Conjunctiva/sclera: Conjunctivae normal.     Pupils: Pupils are equal, round, and reactive to light.  Cardiovascular:     Rate and  Rhythm: Normal rate and regular rhythm.     Pulses: Normal pulses.     Heart sounds: Normal heart sounds. No murmur heard.    No friction rub. No gallop.  Pulmonary:     Effort: Pulmonary effort is normal. No respiratory distress.     Breath sounds: Normal breath sounds. No stridor. No wheezing, rhonchi or rales.  Chest:     Chest wall: No tenderness.  Musculoskeletal:        General: Normal range of motion.     Cervical back: Normal range of motion and neck supple.  Skin:    General: Skin is warm and dry.     Capillary Refill: Capillary refill takes less than 2 seconds.     Coloration: Skin is not jaundiced or pale.     Findings: No bruising, erythema, lesion or rash.  Neurological:     General: No focal deficit present.     Mental Status: She is alert and oriented to person, place, and time. Mental  status is at baseline.  Psychiatric:        Mood and Affect: Mood normal.        Behavior: Behavior normal.        Thought Content: Thought content normal.        Judgment: Judgment normal.     Results for orders placed or performed during the hospital encounter of 02/29/24  NM Myocar Multi W/Spect W/Wall Motion / EF   Collection Time: 02/29/24 12:09 PM  Result Value Ref Range   Rest HR 66.0 bpm   Rest BP 129/72 mmHg   Peak HR 83 bpm   Peak BP 140/55 mmHg   MPHR 160 bpm   Percent HR 51.0 %   ST Depression (mm) 0 mm   Rest Nuclear Isotope Dose 10.4 mCi   Stress Nuclear Isotope Dose 32.3 mCi   SSS 3.0    SRS 4.0    SDS 0.0    TID 0.85    Nuc Stress EF 51 %   LV sys vol 69.0 3.8 - 5.2 mL   LV dias vol 140.0 46 - 106 mL      Assessment & Plan:   Problem List Items Addressed This Visit       Digestive   Gastroesophageal reflux disease without esophagitis   Will increase her omeprazole  to 40mg . Call with any concerns. Continue to monitor.       Relevant Medications   omeprazole  (PRILOSEC) 40 MG capsule     Nervous and Auditory   Osteoarthritis of spine with radiculopathy, lumbar region   Will help arrange her MRI through neurosurgery. Await results. Treat as needed.       Other Visit Diagnoses       Bilateral arm pain    -  Primary   Concern for cervical djd- will check x-ray. Continue to follow with neurosurgery. Call with any concerns.   Relevant Orders   DG Cervical Spine Complete     DOE (dyspnea on exertion)       Stress test negative. Offered PT to help with back and deconditioning- she'd like to hold off for now. Continue to monitor closely.        Follow up plan: Return in about 3 months (around 08/04/2024).

## 2024-05-04 NOTE — Assessment & Plan Note (Signed)
 Will help arrange her MRI through neurosurgery. Await results. Treat as needed.

## 2024-05-12 ENCOUNTER — Ambulatory Visit
Admission: RE | Admit: 2024-05-12 | Discharge: 2024-05-12 | Disposition: A | Discharge status: Home / Self Care | Source: Ambulatory Visit | Attending: Orthopedic Surgery | Admitting: Orthopedic Surgery

## 2024-05-12 DIAGNOSIS — M5416 Radiculopathy, lumbar region: Secondary | ICD-10-CM | POA: Diagnosis present

## 2024-05-12 DIAGNOSIS — M4316 Spondylolisthesis, lumbar region: Secondary | ICD-10-CM | POA: Insufficient documentation

## 2024-05-12 DIAGNOSIS — M47816 Spondylosis without myelopathy or radiculopathy, lumbar region: Secondary | ICD-10-CM | POA: Diagnosis present

## 2024-05-23 ENCOUNTER — Ambulatory Visit
Admission: RE | Admit: 2024-05-23 | Discharge: 2024-05-23 | Disposition: A | Discharge status: Home / Self Care | Source: Ambulatory Visit | Attending: Family Medicine

## 2024-05-23 ENCOUNTER — Ambulatory Visit
Admission: RE | Admit: 2024-05-23 | Discharge: 2024-05-23 | Disposition: A | Discharge status: Home / Self Care | Attending: Family Medicine | Admitting: Family Medicine

## 2024-05-23 DIAGNOSIS — M79602 Pain in left arm: Secondary | ICD-10-CM | POA: Insufficient documentation

## 2024-05-23 DIAGNOSIS — M79601 Pain in right arm: Secondary | ICD-10-CM | POA: Insufficient documentation

## 2024-06-01 ENCOUNTER — Ambulatory Visit: Payer: Self-pay | Admitting: Family Medicine

## 2024-06-01 DIAGNOSIS — M5412 Radiculopathy, cervical region: Secondary | ICD-10-CM

## 2024-06-06 ENCOUNTER — Ambulatory Visit
Admission: RE | Admit: 2024-06-06 | Discharge: 2024-06-06 | Disposition: A | Discharge status: Home / Self Care | Source: Ambulatory Visit | Attending: Family Medicine | Admitting: Family Medicine

## 2024-06-06 DIAGNOSIS — M5412 Radiculopathy, cervical region: Secondary | ICD-10-CM | POA: Diagnosis present

## 2024-06-12 ENCOUNTER — Ambulatory Visit: Payer: Self-pay | Admitting: Family Medicine

## 2024-06-23 NOTE — Progress Notes (Addendum)
 Referring Physician:  Vicci Duwaine SQUIBB, DO [address] [place],  [place] [address]  Primary Physician:  Vicci Duwaine SQUIBB, DO  History of Present Illness: [name] has a history of HTN, OSA, prediabetes, obesity, history of endometrial CA.   Last seen by me on 02/21/24 for constant LBP and right leg pain. Imaging from 2022 and 2023 showed slip L4-L5 and L5-S1 with multilevel foraminal stenosis.   I ordered a new lumbar MRI and xrays. She was lost to follow up.   She is here with new complaints of neck and bilateral arm pain.   She has constant neck pain with intermittent left arm pain/numbness to her elbow. No right arm pain. No alleviating or aggravating factors. No weakness in her hands. No dexterity issues. No balance issues.   She continues with constant LBP with right posterior leg pain to her foot. No left leg pain. She has tingling. No numbness or weakness in her right leg. Pain is worse with walking and sitting. Some relief with laying flat.   LBP > neck pain.   No relief with lyrica  in the past. She is on mobic  with minimal relief.   She does not smoke.   Bowel/Bladder Dysfunction: none  Conservative measures:  Physical therapy: has not participated in PT  Multimodal medical therapy including regular antiinflammatories: Meloxicam , lyrica  Injections:  Thinks she had lumbar ESIs around 2021 in [facility] 07/20/17: Right L5/S1 transforaminal epidural Steroid injection by Dr Renato at [facility]  05/14/17:  Right L5/S1 transforaminal epidural Steroid injection by Dr Telhan at [facility]  01/07/15: left L4\L5 or L5\S1 facet joint injections under fluoroscopy by Dr Janith at [facility]   Past Surgery: none  Inocente LITTIE Birkenhead has no symptoms of cervical myelopathy.  The symptoms are causing a significant impact on the patient's life.   Review of Systems:  A 10 point review of systems is negative, except for the pertinent positives and negatives detailed in the HPI.  Past Medical History: Past  Medical History:  Diagnosis Date   Cancer (HCC) 2021   Uterine    COVID    Endometrial cancer (HCC)    Hypertension    Morbid obesity (HCC)    Sleep apnea     Past Surgical History: Past Surgical History:  Procedure Laterality Date   DILATION AND CURETTAGE, DIAGNOSTIC / THERAPEUTIC     ESOPHAGOGASTRODUODENOSCOPY (EGD) WITH PROPOFOL  N/A 02/24/2022   Procedure: ESOPHAGOGASTRODUODENOSCOPY (EGD) WITH PROPOFOL ;  Surgeon: Jinny Carmine, MD;  Location: [facility] ENDOSCOPY;  Service: Endoscopy;  Laterality: N/A;   EXPLORATORY LAPAROTOMY     for ectopic pregnancy   FOOT SURGERY Left    TOTAL LAPAROSCOPIC HYSTERECTOMY WITH BILATERAL SALPINGO OOPHORECTOMY Bilateral 05/22/2020   Procedure: TOTAL LAPAROSCOPIC HYSTERECTOMY WITH BILATERAL SALPINGO OOPHORECTOMY AND WASHINGS, PERINEAL BIOPSY;  Surgeon: Mancil Barter, MD;  Location: [facility] ORS;  Service: Gynecology;  Laterality: Bilateral;    Allergies: Allergies as of 06/26/2024   (No Known Allergies)    Medications: Outpatient Encounter Medications as of 06/26/2024  Medication Sig   albuterol  (VENTOLIN  HFA) 108 (90 Base) MCG/ACT inhaler Inhale 2 puffs into the lungs every 6 (six) hours as needed for wheezing or shortness of breath.   amLODipine  (NORVASC ) 5 MG tablet Take 1 tablet (5 mg total) by mouth daily.   fluticasone (FLONASE) 50 MCG/ACT nasal spray Place 2 sprays into both nostrils daily.   meloxicam  (MOBIC ) 15 MG tablet Take 1 tablet (15 mg total) by mouth daily.   omeprazole  (PRILOSEC) 40 MG  capsule Take 1 capsule (40 mg total) by mouth daily.   No facility-administered encounter medications on file as of 06/26/2024.    Social History: Social History   Tobacco Use   Smoking status: Never   Smokeless tobacco: Never  Vaping Use   Vaping status: Never Used  Substance Use Topics   Alcohol use: Never   Drug use: Never    Family Medical History: Family History  Problem Relation Age of Onset   Heart attack Mother    Cancer Mother     Cancer Father    Breast cancer Neg Hx     Physical Examination: Vitals:   06/26/24 1024  BP: 122/76      Awake, alert, oriented to person, place, and time.  Speech is clear and fluent. Fund of knowledge is appropriate.   Cranial Nerves: Pupils equal round and reactive to light.  Facial tone is symmetric.    She has painful limited ROM of both shoulders.   No abnormal lesions on exposed skin.   Strength: Side Biceps Triceps Deltoid Interossei Grip Wrist Ext. Wrist Flex.  R 5 5 5 5 5 5 5   L 5 5 5 5 5 5 5    Side Iliopsoas Quads Hamstring PF DF EHL  R 5 5 5 5 5 5   L 5 5 5 5 5 5    Reflexes are 2+ and symmetric at the biceps, brachioradialis, patella and achilles.   Hoffman's is absent.  Clonus is not present.   Bilateral upper and lower extremity sensation is intact to light touch.     No pain with IR/ER of both hips.   Gait is normal.     Medical Decision Making  Imaging: Lumbar xrays dated 05/12/24:  FINDINGS: Segmentation:  Standard.   Alignment: Unchanged grade 1 anterolisthesis of L4 on L5 and L5 on S1. Mild lumbar levoscoliosis.   Vertebrae: No fracture, suspicious marrow lesion, or significant marrow edema.   Conus medullaris and cauda equina: Conus extends to the L1-2 level. Conus and cauda equina appear normal.   Paraspinal and other soft tissues: Unremarkable.   Disc levels:   Disc desiccation throughout the lumbar and included lower thoracic spine. Severe disc space narrowing at T11-12, moderate to severe narrowing at L5-S1, and milder narrowing elsewhere.   T10-11: Mild disc bulging, small central disc protrusion, and mild-to-moderate left facet hypertrophy without stenosis.   T11-12: Right eccentric disc bulging and moderate facet hypertrophy result in mild spinal stenosis and moderate right neural foraminal stenosis, unchanged.   T12-L1: Minimal disc bulging without stenosis, unchanged.   L1-2: Minimal disc bulging and mild facet  hypertrophy without stenosis, unchanged.   L2-3: Minimal right eccentric disc bulging, endplate spurring, and mild facet hypertrophy without stenosis, unchanged.   L3-4: Mild disc bulging eccentric to the right and moderate to severe facet and ligamentum flavum hypertrophy without stenosis, unchanged.   L4-5: Anterolisthesis with slight bulging of uncovered disc and severe facet hypertrophy result in mild spinal stenosis and mild right and moderate left neural foraminal stenosis, unchanged.   L5-S1: Anterolisthesis with mild bulging of uncovered disc, endplate spurring, and severe facet arthrosis result in mild bilateral neural foraminal stenosis without spinal stenosis, unchanged.   IMPRESSION: 1. Unchanged lumbar disc degeneration and severe lower lumbar facet arthrosis with grade 1 anterolisthesis at L4-5 and L5-S1. 2. Mild spinal stenosis at T11-12 and L4-5. 3. Moderate neural foraminal stenosis on the right at T11-12 and on the left at L4-5.     Electronically  Signed   By: Dasie Hamburg M.D.   On: 05/22/2024 10:38   Cervical xrays dated 05/23/24:  FINDINGS: Straightening of the cervical spine. Advanced disc space narrowing and degenerative change C3 through T1. Dens and lateral masses are within normal limits. No high-grade bony foraminal narrowing on oblique views. Normal prevertebral soft tissue thickness.   IMPRESSION: Straightening of the cervical spine with advanced degenerative changes C3 through T1.     Electronically Signed   By: Luke Bun M.D.   On: 05/27/2024 20:48   Cervical MRI dated 06/06/24:  FINDINGS: Alignment: Straightening of the normal cervical lordosis. Trace facet mediated anterolisthesis of T1 on T2.   Vertebrae: Vertebral body height maintained without acute or chronic fracture. Bone marrow signal intensity mildly decreased on T1 weighted imaging, nonspecific, but most commonly related to anemia, smoking or obesity. No worrisome  osseous lesions. Mild degenerative reactive endplate changes present about the C3-4 through C7-T1 interspaces. No other abnormal marrow edema.   Cord: Patchy focus of signal abnormality involving the right cord at the level of C4-5, consistent with a small focus of chronic myelomalacia (series 8, image 19). No other convincing cord signal changes.   Posterior Fossa, vertebral arteries, paraspinal tissues: Unremarkable.   Disc levels:   C2-C3: Mild right eccentric endplate spurring without significant disc bulge. Right C2-3 facet is ankylosed. No spinal stenosis. Foramina remain patent.   C3-C4: Degenerative vertebral disc space narrowing with diffuse disc osteophyte complex. Flattening of the ventral thecal sac with mild cord flattening. Mild spinal stenosis. Foramina remain patent.   C4-C5: Degenerative vertebral disc space narrowing with diffuse disc osteophyte complex. Posterior component flattens and indents the ventral thecal sac. Mild cord flattening with subtle patchy cord signal changes as above. No more than mild spinal stenosis. Foramina remain patent.   C5-C6: Degenerative vertebral disc space narrowing with diffuse disc osteophyte complex. Superimposed small central disc protrusion indents the ventral thecal sac (series 9, image 25). Minimal cord flattening without cord signal changes. No more than mild spinal stenosis. Foramina remain patent.   C6-C7: Diffuse disc bulge with left greater than right uncovertebral spurring. Superimposed small right paracentral disc protrusion with slight inferior migration (series 8, image 30). Flattening of the ventral thecal sac with no more than mild spinal stenosis. Borderline mild left C7 foraminal stenosis. Right neural foramen widely patent.   C7-T1: Degenerative intervertebral disc space narrowing with diffuse disc bulge and bilateral uncovertebral spurring. Mild bilateral facet hypertrophy. No spinal stenosis. Foramina  remain patent.   T1-2: Diffuse disc bulge with uncovertebral spurring. Mild facet hypertrophy. No spinal stenosis. Foramina remain patent.   IMPRESSION: 1. Multilevel cervical spondylosis without significant spinal stenosis or overt neural impingement. 2. Borderline mild left C7 foraminal narrowing related to disc bulge and uncovertebral disease. No other significant foraminal encroachment within the cervical spine. 3. Small focus of chronic myelomalacia involving the right hemi cord at the level of C4-5.     Electronically Signed   By: Morene Hoard M.D.   On: 06/07/2024 05:32   Cervical xrays dated 05/23/24:  FINDINGS: Straightening of the cervical spine. Advanced disc space narrowing and degenerative change C3 through T1. Dens and lateral masses are within normal limits. No high-grade bony foraminal narrowing on oblique views. Normal prevertebral soft tissue thickness.   IMPRESSION: Straightening of the cervical spine with advanced degenerative changes C3 through T1.     Electronically Signed   By: Luke Bun M.D.   On: 05/27/2024 20:48  I have  personally reviewed the images and agree with the above interpretation.  Assessment and Plan: Ms. Roes has neck and left arm pain along with LBP and right leg pain. LBP > neck pain.   She has constant neck pain with intermittent left arm pain/numbness to her elbow. No right arm pain. No weakness in her hands. No dexterity issues. No balance issues.   She has known cervical spondylosis and DDD. She has mild central stenosis C3-C6 with mild left foraminal stenosis C6-C7.   Radiology makes note of chronic myelomalacia at C4-C5. No signs of symptoms of cervical myelopathy.   Neck pain is likely from underlying spondylosis and DDD. Left arm symptoms may be from foraminal stenosis C6-C7.   She has limited painful ROM of her shoulders and this pain is likely shoulder mediated.     She also continues with constant LBP  with right posterior leg pain to her foot. No left leg pain. She has tingling. No numbness or weakness in her right leg.   She has known severe DDD T11-T12. She has slip L4-L5 with mild central stenosis and mild right/moderate left foraminal stenosis along with slip L5-S1 with mild bilateral foraminal stenosis.   LBP likely from underlying spondylosis and DDD along with slip L4-S1. Right leg pain may be from foraminal stenosis L5-S1.   Treatment options discussed with patient and following plan made:   - Reviewed above imaging at length.  - Recommend PT for cervical and lumbar spine. She declines.  - Discussed repeat lumbar and/or cervical injections. She declines as previous lumbar injections did not work and she hates needles.  - Discussed that bilateral shoulder pain is likely shoulder mediated. Recommend referral to ortho. She declines for now.  - She is interested in any possible medical management of her pain. Referral to [facility] in [place].  - Reviewed signs/symptoms of cervical myelopathy. She does not have any currently.  - I will message her in 4-6 weeks to check on her progress.   I spent a total of 30 minutes in face-to-face and non-face-to-face activities related to this patient's care today including review of outside records, review of imaging, review of symptoms, physical exam, discussion of differential diagnosis, discussion of treatment options, and documentation.   ADDENDUM 07/06/24:  Patient reviewed with Dr. Claudene regarding chronic myelomalacia seen on MRI at C4-C5. He recommends cervical xrays with flex/ext to evaluate for instability. Will send her a MyChart message.   Glade Boys PA-C Dept. of Neurosurgery

## 2024-06-26 ENCOUNTER — Ambulatory Visit (INDEPENDENT_AMBULATORY_CARE_PROVIDER_SITE_OTHER): Admitting: Orthopedic Surgery

## 2024-06-26 ENCOUNTER — Encounter: Payer: Self-pay | Admitting: Orthopedic Surgery

## 2024-06-26 VITALS — BP 122/76 | Ht 62.0 in | Wt 239.0 lb

## 2024-06-26 DIAGNOSIS — M5030 Other cervical disc degeneration, unspecified cervical region: Secondary | ICD-10-CM | POA: Diagnosis not present

## 2024-06-26 DIAGNOSIS — M4316 Spondylolisthesis, lumbar region: Secondary | ICD-10-CM

## 2024-06-26 DIAGNOSIS — M5416 Radiculopathy, lumbar region: Secondary | ICD-10-CM

## 2024-06-26 DIAGNOSIS — M4802 Spinal stenosis, cervical region: Secondary | ICD-10-CM

## 2024-06-26 DIAGNOSIS — M5412 Radiculopathy, cervical region: Secondary | ICD-10-CM

## 2024-06-26 DIAGNOSIS — M47812 Spondylosis without myelopathy or radiculopathy, cervical region: Secondary | ICD-10-CM

## 2024-06-26 DIAGNOSIS — G9589 Other specified diseases of spinal cord: Secondary | ICD-10-CM | POA: Diagnosis not present

## 2024-06-26 DIAGNOSIS — M4317 Spondylolisthesis, lumbosacral region: Secondary | ICD-10-CM

## 2024-06-26 DIAGNOSIS — M47816 Spondylosis without myelopathy or radiculopathy, lumbar region: Secondary | ICD-10-CM

## 2024-06-26 DIAGNOSIS — M5134 Other intervertebral disc degeneration, thoracic region: Secondary | ICD-10-CM

## 2024-06-26 NOTE — Patient Instructions (Signed)
 It was so nice to see you today. Thank you so much for coming in.    You have wear and tear in your neck (arthritis) and may have some irritation of nerve on left causing the left arm symptoms.   I think the pain in your shoulders is from your shoulders.   You also have wear and tear in your back (arthritis) with a slip at L4-L5 and L5-S1. There is some irritation of the nerve at L5-S1 that may be causing your leg pain.   I put in a referral to [facility] in [place]. They can discuss medical management of your pain (medications). They should call you, but you can call them at [phone].   We can revisit injections and physical therapy for your neck and back as well. Can also consider referral to ortho for your shoulder pain.   I will message you in 4-6 weeks to check on you.   Please do not hesitate to call if you have any questions or concerns. You can also message me in MyChart.   Glade Boys PA-C [phone]     The physicians and staff at [facility] at [facility] are committed to providing excellent care. You may receive a survey asking for feedback about your experience at our office. We value you your feedback and appreciate you taking the time to to fill it out. The [facility] leadership team is also available to discuss your experience in person, feel free to contact us  [phone].

## 2024-06-28 ENCOUNTER — Inpatient Hospital Stay: Payer: Medicare Other | Attending: Obstetrics and Gynecology | Admitting: Obstetrics and Gynecology

## 2024-06-28 ENCOUNTER — Encounter: Payer: Self-pay | Admitting: Obstetrics and Gynecology

## 2024-06-28 VITALS — BP 142/79 | HR 70 | Temp 97.3°F | Resp 19 | Ht 62.0 in | Wt 238.7 lb

## 2024-06-28 DIAGNOSIS — Z9079 Acquired absence of other genital organ(s): Secondary | ICD-10-CM | POA: Insufficient documentation

## 2024-06-28 DIAGNOSIS — Z8542 Personal history of malignant neoplasm of other parts of uterus: Secondary | ICD-10-CM | POA: Diagnosis present

## 2024-06-28 DIAGNOSIS — Z90710 Acquired absence of both cervix and uterus: Secondary | ICD-10-CM | POA: Insufficient documentation

## 2024-06-28 DIAGNOSIS — Z90722 Acquired absence of ovaries, bilateral: Secondary | ICD-10-CM | POA: Insufficient documentation

## 2024-06-28 NOTE — Progress Notes (Signed)
 Gynecologic Oncology Interval Visit   Referring Provider: Mavis Rosaline Sober, CNM [address] [place],  [place] [address] [phone]  Chief Concern: endometrial cancer  Subjective:  Allison Dixon is a 63 y.o. G48P7 female, diagnosed with stage IA grade I endometrial cancer s/p TLH BSO with bilateral pelvic SLN mapping (no biopsies) and right periurethral biopsy (negative) on 05/22/20, who returns to clinic for follow-up.   She was diagnosed with stage IA grade 1 endometrial cancer.  She underwent TLH BSO with bilateral pelvic SLN mapping and right periurethral biopsy with Dr. Mancil and Dr. Connell on 05/22/20 with invasion 5/25 mm and negative LVSI, adnexa, cervix and washings. SLN not biopsied due to no mapping because of morbid obesity. Periurethral biopsy negative. Cancer has loss of MLH1 and PMS2 and MLH1 promoter methylation assay positive.   She saw Dr. Mancil last in 06/2023. She has not seen Dr Connell for surveillance in the interim. No new gyn complaints.   Gynecologic Oncology: Initially seen in consultation from Rosaline Sober for consultation for endometrial cancer. She presented with abnormal bleeding, irregular x 6-7 months. Her evaluation included the following tests:   Pelvic US  04/18/2020 The uterus is anteverted and measures 10.2 x 5.4. Echo texture is heterogenous with evidence of focal masses. Within the uterus are multiple suspected fibroids measuring: Fibroid 1:Posterior IM 2.6 x 3.1 x 3.0 cm Fibroid 2:Anterior IM 2.2 x 2.2 x 1.8 cm The Endometrium measures 14 mm. Right Ovary was not seen Left Ovary was not seen Survey of the adnexa demonstrates no adnexal masses. There is no free fluid in the cul de sac.  Endometrial biopsy 04/18/2020  A. 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   DIAGNOSIS:  A.   UTERUS WITH CERVIX, BILATERAL FALLOPIAN TUBES AND OVARIES; TOTAL  HYSTERECTOMY WITH BILATERAL SALPINGO-OOPHORECTOMY:  - ENDOMETRIAL CARCINOMA.  - SEE CANCER STAGING SUMMARY BELOW.  - INACTIVE BACKGROUND ENDOMETRIUM WITH 2.3 CM ENDOMETRIAL POLYP WITH  ENDOMETRIAL INTRAEPITHELIAL NEOPLASIA.  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  - MYOMETRIUM WITH ADENOMYOSIS.  - BILATERAL OVARIES, NEGATIVE FOR MALIGNANCY.  - BILATERAL FALLOPIAN TUBES, NEGATIVE FOR MALIGNANCY.   B. PERI URETHRA, RIGHT; BIOPSY:  - LENTIGO SIMPLEX.  - HYPERKERATOSIS.  - NEGATIVE FOR DYSPLASIA AND MALIGNANCY.  - DEEPER SECTIONS AND S100 STAIN EXAMINED.   CANCER CASE SUMMARY: ENDOMETRIUM  Procedure: Total hysterectomy and bilateral salpingo-oophorectomy  Tumor Size: Greatest dimension: 4 cm  Histologic Type: Endometrioid carcinoma  Histologic Grade: FIGO grade 1  Myometrial Invasion: Present       Depth of invasion: 5 mm       Myometrial thickness: 25 mm       Percentage of myometrial invasion: 20%  Uterine Serosa Involvement: Not identified  Cervical Stromal Involvement: Not identified  Other Tissue/Organ Involvement: Not applicable  Lymphovascular Invasion: Not identified  Regional Lymph Nodes: No lymph nodes submitted or found  Pathologic Stage Classification (pTNM, AJCC 8th Edition): pT1a pN not  assigned / FIGO IA   Immunohistochemistry (IHC) Testing for DNA Mismatch Repair (MMR)  Proteins:  Results:  MLH1: Loss of protein expression  MSH2: Intact nuclear expression  MSH6: Intact nuclear expression, heterogenous pattern  PMS2: Loss of protein expression   IHC Interpretation: Abnormal result with absent nuclear staining for MLH1 and PMS2, high probability of MSI-H.  This  pattern of deficient DNA repair proteins is most often associated with methylation of the MLH1 gene promoter. Testing for MLH1 promoter methylation has been requested and the result will be  reported in an addendum. Of note, a heterogeneous pattern of MSH-6 staining typically does not indicate germline mutation in MSH6 but is commonly associated with abnormality in another MMR gene such as MLH1 or PMS2, or even other DNA repair genes such as DNA polymerase.  MLH1 promoter methylation positive  Problem List: Patient Active Problem List   Diagnosis Date Noted   Gastroesophageal reflux disease without esophagitis 05/04/2024   Osteoarthritis of spine with radiculopathy, lumbar region 02/01/2024   Dysphagia    Cardiac murmur 01/27/2022   Hematuria 10/14/2021   History of endometrial cancer 07/29/2021   Primary hypertension 01/14/2021   Prediabetes 01/14/2021   Chronic bilateral low back pain with bilateral sciatica 01/14/2021   Morbid obesity (HCC) 01/14/2021   S/P laparoscopic hysterectomy 05/30/2020   Moderate obstructive sleep apnea 05/07/2020  Ectopic Pregnancy  Past Medical History: Past Medical History:  Diagnosis Date   Cancer (HCC) 2021   Uterine    COVID    Endometrial cancer (HCC)    Hypertension    Morbid obesity (HCC)    Sleep apnea     Past Surgical History: Past Surgical History:  Procedure Laterality Date   DILATION AND CURETTAGE, DIAGNOSTIC / THERAPEUTIC     ESOPHAGOGASTRODUODENOSCOPY (EGD) WITH PROPOFOL  N/A 02/24/2022   Procedure: ESOPHAGOGASTRODUODENOSCOPY (EGD) WITH PROPOFOL ;  Surgeon: Jinny Carmine, MD;  Location: [facility];  Service: Endoscopy;  Laterality: N/A;   EXPLORATORY LAPAROTOMY     for ectopic pregnancy   FOOT SURGERY Left    TOTAL LAPAROSCOPIC HYSTERECTOMY WITH BILATERAL SALPINGO OOPHORECTOMY Bilateral 05/22/2020   Procedure: TOTAL LAPAROSCOPIC HYSTERECTOMY WITH BILATERAL SALPINGO OOPHORECTOMY AND WASHINGS, PERINEAL BIOPSY;  Surgeon: Mancil Barter, MD;  Location: [facility] ORS;  Service: Gynecology;  Laterality: Bilateral;    Past Gynecologic History:  As per HPI  OB History:  SVD x 7 OB History  Gravida Para Term Preterm AB  Living  7 7 0 0 0 0  SAB IAB Ectopic Multiple Live Births  0 0 0 0 0    # Outcome Date GA Lbr Len/2nd Weight Sex Type Anes PTL Lv  7 Para           6 Para           5 Para           4 Para           3 Para           2 Para           1 Para             Obstetric Comments  NSVD x 7    Family History: Family History  Problem Relation Age of Onset   Heart attack Mother    Cancer Mother    Cancer Father    Breast cancer Neg Hx     Social History: Social History   Socioeconomic History   Marital status: Widowed    Spouse name: Not on file   Number of children: 7   Years of education: Not on file   Highest education level: 10th grade  Occupational History   Occupation: disability  Tobacco Use   Smoking status: Never   Smokeless tobacco: Never  Vaping Use   Vaping status: Never Used  Substance and Sexual Activity   Alcohol  use: Never   Drug use: Never   Sexual activity: Not Currently    Birth control/protection: Surgical  Other Topics Concern   Not on file  Social History Narrative   Not on file   Social Drivers of Health   Financial Resource Strain: Patient Declined (05/01/2024)   Overall Financial Resource Strain (CARDIA)    Difficulty of Paying Living Expenses: Patient declined  Food Insecurity: Food Insecurity Present (05/01/2024)   Hunger Vital Sign    Worried About Running Out of Food in the Last Year: Sometimes true    Ran Out of Food in the Last Year: Sometimes true  Transportation Needs: No Transportation Needs (05/01/2024)   PRAPARE - Administrator, Civil Service (Medical): No    Lack of Transportation (Non-Medical): No  Physical Activity: Inactive (05/01/2024)   Exercise Vital Sign    Days of Exercise per Week: 0 days    Minutes of Exercise per Session: Not on file  Stress: Stress Concern Present (05/01/2024)   [employer] of Occupational Health - Occupational Stress Questionnaire    Feeling of Stress: To some extent  Social  Connections: Moderately Integrated (05/01/2024)   Social Connection and Isolation Panel    Frequency of Communication with Friends and Family: More than three times a week    Frequency of Social Gatherings with Friends and Family: Once a week    Attends Religious Services: 1 to 4 times per year    Active Member of [employer] or Organizations: Yes    Attends Banker Meetings: 1 to 4 times per year    Marital Status: Widowed  Intimate Partner Violence: Not At Risk (07/15/2023)   Humiliation, Afraid, Rape, and Kick questionnaire    Fear of Current or Ex-Partner: No    Emotionally Abused: No    Physically Abused: No    Sexually Abused: No   Allergies: No Known Allergies   Review of Systems General:  no complaints Skin: no complaints Eyes: no complaints HEENT: no complaints Breasts: no complaints Pulmonary: no complaints Cardiac: no complaints Gastrointestinal: no complaints Genitourinary/Sexual: no complaints Ob/Gyn: no complaints Musculoskeletal: no complaints Hematology: no complaints Neurologic/Psych: no complaints   Objective:  Physical Examination:  BP (!) 142/79   Pulse 70   Temp (!) 97.3 F (36.3 C)   Resp 19   Ht 5' 2 (1.575 m)   Wt 238 lb 11.2 oz (108.3 kg)   LMP  (LMP Unknown)   SpO2 100%   BMI 43.66 kg/m    ECOG Performance Status: 1 - Symptomatic but completely ambulatory  GENERAL: Patient is a well appearing female in no acute distress HEENT:  Atraumatic and normocephalic. PERRL, neck supple. NODES:  No cervical, supraclavicular, axillary, or inguinal lymphadenopathy palpated.  LUNGS:  Normal respiratory effort ABDOMEN:  Soft, nontender. Nondistended. No masses/ascites/hernia/or hepatomegaly.  EXTREMITIES:  No peripheral edema.   SKIN:  Clear with no obvious rashes or skin changes. No nail dyscrasia. NEURO:  Nonfocal. Well oriented.  Appropriate affect.  Pelvic: EGBUS: no lesions Cervix: surgically absent Vagina: no lesions, no discharge  or bleeding Uterus: surgically absent BME: no palpable masses Rectovaginal: deferred    Lab Review No labs on site today  Radiologic Imaging: As per HPI   Assessment:  Allison Dixon is a 63 y.o. female diagnosed with stage IA grade 1 endometrial cancer.  She underwent TLH BSO with bilateral pelvic SLN mapping and right periurethral biopsy with Dr. Mancil and Dr. Connell on 05/22/20 with invasion  5/25 mm and negative LVSI, adnexa, cervix and washings. SLN not biopsied due to no mapping because of morbid obesity. Periurethral biopsy negative. Clincally NED.   Cancer has loss of MLH1 and PMS2 and MLH1 promoter methylation assay positive. No need for Lynch Syndrome testing.   History of exploratory laparotomy for ectopic pregnancy.    Medical co-morbidities complicating care: morbid obesity (MBI 42), HTN and prior abdominal surgery.  Plan:   Problem List Items Addressed This Visit       Other   History of endometrial cancer - Primary   Follow up with either Tinnie Dawn, NP in Survivorship clinic or with me in 6 months.  Can return sooner if any bleeding, discharge, pain or other concerning symptoms.  Recommended follow up with her PCP for routine screening such as mammogram and other medical issues.   I personally interviewed and examined the patient. Agreed with the above/below plan of care. I have directly contributed to assessment and plan of care of this patient and educated and discussed with patient and family.   Syble Picco Isidor Constable, MD

## 2024-07-06 NOTE — Addendum Note (Signed)
 Addended by: HILMA HASTINGS on: 07/06/2024 09:15 AM   Modules accepted: Orders

## 2024-07-10 ENCOUNTER — Ambulatory Visit (INDEPENDENT_AMBULATORY_CARE_PROVIDER_SITE_OTHER)

## 2024-07-10 DIAGNOSIS — M542 Cervicalgia: Secondary | ICD-10-CM

## 2024-07-10 DIAGNOSIS — M5030 Other cervical disc degeneration, unspecified cervical region: Secondary | ICD-10-CM

## 2024-07-10 DIAGNOSIS — M47812 Spondylosis without myelopathy or radiculopathy, cervical region: Secondary | ICD-10-CM

## 2024-07-10 DIAGNOSIS — M5412 Radiculopathy, cervical region: Secondary | ICD-10-CM

## 2024-07-14 ENCOUNTER — Encounter: Payer: Self-pay | Admitting: Orthopedic Surgery

## 2024-07-14 DIAGNOSIS — M5416 Radiculopathy, lumbar region: Secondary | ICD-10-CM

## 2024-07-14 DIAGNOSIS — M5412 Radiculopathy, cervical region: Secondary | ICD-10-CM

## 2024-07-14 DIAGNOSIS — M4316 Spondylolisthesis, lumbar region: Secondary | ICD-10-CM

## 2024-07-14 DIAGNOSIS — M47812 Spondylosis without myelopathy or radiculopathy, cervical region: Secondary | ICD-10-CM

## 2024-07-14 DIAGNOSIS — M5030 Other cervical disc degeneration, unspecified cervical region: Secondary | ICD-10-CM

## 2024-07-14 DIAGNOSIS — M47816 Spondylosis without myelopathy or radiculopathy, lumbar region: Secondary | ICD-10-CM

## 2024-07-14 NOTE — Telephone Encounter (Signed)
 Cervical xrays dated 07/10/24:  FINDINGS: No acute fracture subluxation of the cervical spine. No listhesis on flexion or extension. The bones are osteopenic. Multilevel degenerative changes and spurring and facet arthropathy. The visualized posterior elements appear intact. The soft tissues are unremarkable.   IMPRESSION: 1. No acute findings. 2. Multilevel degenerative changes.     Electronically Signed   By: Vanetta Chou M.D.   On: 07/13/2024 20:41  I don't appreciate any instability.

## 2024-07-17 NOTE — Addendum Note (Signed)
 Addended byBETHA HILMA HASTINGS on: 07/17/2024 03:50 PM   Modules accepted: Orders

## 2024-07-27 ENCOUNTER — Ambulatory Visit (INDEPENDENT_AMBULATORY_CARE_PROVIDER_SITE_OTHER): Payer: Self-pay | Admitting: Emergency Medicine

## 2024-07-27 VITALS — BP 130/82 | Ht 61.0 in | Wt 241.8 lb

## 2024-07-27 DIAGNOSIS — Z1212 Encounter for screening for malignant neoplasm of rectum: Secondary | ICD-10-CM | POA: Diagnosis not present

## 2024-07-27 DIAGNOSIS — Z0000 Encounter for general adult medical examination without abnormal findings: Secondary | ICD-10-CM

## 2024-07-27 DIAGNOSIS — Z1211 Encounter for screening for malignant neoplasm of colon: Secondary | ICD-10-CM | POA: Diagnosis not present

## 2024-07-27 NOTE — Patient Instructions (Signed)
 Allison Dixon,  Thank you for taking the time for your Medicare Wellness Visit. I appreciate your continued commitment to your health goals. Please review the care plan we discussed, and feel free to reach out if I can assist you further.  Please note that Annual Wellness Visits do not include a physical exam. Some assessments may be limited, especially if the visit was conducted virtually. If needed, we may recommend an in-person follow-up with your provider.  Ongoing Care Seeing your primary care provider every 3 to 6 months helps us  monitor your health and provide consistent, personalized care.   Referrals If a referral was made during today's visit and you haven't received any updates within two weeks, please contact the referred provider directly to check on the status.  Recommended Screenings:    Recommend a routine eye exam every 1-2 years. I have included a list of eye doctors in the area.  An order has been placed for a Cologuard for you. They will mail you the kit with instructions on how to obtain the sample and send it back in to be tested. If you do not received your kit, please call our office and let us  know.    Health Maintenance  Topic Date Due   Pneumococcal Vaccine for age over 73 (1 of 1 - PCV) Never done   Zoster (Shingles) Vaccine (1 of 2) Never done   Medicare Annual Wellness Visit  07/14/2024   Flu Shot  09/27/2024*   Cologuard (Stool DNA test)  08/15/2024   Breast Cancer Screening  04/20/2025   DTaP/Tdap/Td vaccine (2 - Tdap) 07/30/2031   Hepatitis C Screening  Completed   HIV Screening  Completed   Hepatitis B Vaccine  Aged Out   HPV Vaccine  Aged Out   Meningitis B Vaccine  Aged Out   COVID-19 Vaccine  Discontinued  *Topic was postponed. The date shown is not the original due date.       07/27/2024    9:27 AM  Advanced Directives  Does Patient Have a Medical Advance Directive? No  Would patient like information on creating a medical advance directive?  Yes (MAU/Ambulatory/Procedural Areas - Information given)    Vision: Annual vision screenings are recommended for early detection of glaucoma, cataracts, and diabetic retinopathy. These exams can also reveal signs of chronic conditions such as diabetes and high blood pressure.  Dental: Annual dental screenings help detect early signs of oral cancer, gum disease, and other conditions linked to overall health, including heart disease and diabetes.  Please see the attached documents for additional preventive care recommendations.    There are several Eye Doctors in your area. Here are a few that usually accept all insurance types:  [facility] [address] [place], [place] [address] Phone: [phone]  [facility] [address] [place], [place] [address] Phone: [phone]  [facility] [address] [place], [place] [address] Phone: [phone]  [facility] [address] [place], [place] [address] Phone: [phone]  [facility] [address] [place], [place] [address] Phone: [phone]  [facility] [address] [place], [place] [address] Phone: [phone]

## 2024-07-27 NOTE — Progress Notes (Signed)
 Chief Complaint  Patient presents with   Medicare Wellness     Subjective:   Allison Dixon is a 63 y.o. female who presents for a Medicare Annual Wellness Visit.  Allergies (verified) Patient has no known allergies.   History: Past Medical History:  Diagnosis Date   Cancer (HCC) 2021   Uterine    COVID    Endometrial cancer (HCC)    Hypertension    Morbid obesity (HCC)    Sleep apnea    Past Surgical History:  Procedure Laterality Date   DILATION AND CURETTAGE, DIAGNOSTIC / THERAPEUTIC     ESOPHAGOGASTRODUODENOSCOPY (EGD) WITH PROPOFOL  N/A 02/24/2022   Procedure: ESOPHAGOGASTRODUODENOSCOPY (EGD) WITH PROPOFOL ;  Surgeon: Jinny Carmine, MD;  Location: [facility];  Service: Endoscopy;  Laterality: N/A;   EXPLORATORY LAPAROTOMY     for ectopic pregnancy   FOOT SURGERY Left    TOTAL LAPAROSCOPIC HYSTERECTOMY WITH BILATERAL SALPINGO OOPHORECTOMY Bilateral 05/22/2020   Procedure: TOTAL LAPAROSCOPIC HYSTERECTOMY WITH BILATERAL SALPINGO OOPHORECTOMY AND WASHINGS, PERINEAL BIOPSY;  Surgeon: Mancil Barter, MD;  Location: [facility] OR[facility];  Service: Gynecology;  Laterality: Bilateral;   Family History  Problem Relation Age of Onset   Heart attack Mother    Cancer Mother    Cancer Father    Breast cancer Neg Hx    Social History   Occupational History   Occupation: disability  Tobacco Use   Smoking status: Never   Smokeless tobacco: Never  Vaping Use   Vaping status: Never Used  Substance and Sexual Activity   Alcohol use: Never   Drug use: Never   Sexual activity: Not Currently    Birth control/protection: Surgical   Tobacco Counseling Counseling given: Not Answered  SDOH Screenings   Food Insecurity: Patient Declined (07/27/2024)  Recent Concern: Food Insecurity - Food Insecurity Present (05/01/2024)  Housing: Low Risk  (07/27/2024)  Transportation Needs: No Transportation Needs (07/27/2024)  Utilities: Not At Risk (07/27/2024)  Alcohol Screen: Low Risk   (07/15/2023)  Depression (PHQ2-9): Low Risk  (07/27/2024)  Recent Concern: Depression (PHQ2-9) - Medium Risk (05/04/2024)  Financial Resource Strain: Low Risk  (07/23/2024)  Physical Activity: Inactive (07/27/2024)  Social Connections: Moderately Integrated (07/27/2024)  Stress: No Stress Concern Present (07/27/2024)  Recent Concern: Stress - Stress Concern Present (05/01/2024)  Tobacco Use: Low Risk  (07/27/2024)  Health Literacy: Adequate Health Literacy (07/27/2024)   See flowsheets for full screening details  Depression Screen PHQ 2 & 9 Depression Scale- Over the past 2 weeks, how often have you been bothered by any of the following problems? Little interest or pleasure in doing things: 0 Feeling down, depressed, or hopeless (PHQ Adolescent also includes...irritable): 0 PHQ-2 Total Score: 0 Trouble falling or staying asleep, or sleeping too much: 0 Feeling tired or having little energy: 0 Poor appetite or overeating (PHQ Adolescent also includes...weight loss): 2 Feeling bad about yourself - or that you are a failure or have let yourself or your family down: 0 Trouble concentrating on things, such as reading the newspaper or watching television (PHQ Adolescent also includes...like school work): 0 Moving or speaking so slowly that other people could have noticed. Or the opposite - being so fidgety or restless that you have been moving around a lot more than usual: 0 Thoughts that you would be better off dead, or of hurting yourself in some way: 0 PHQ-9 Total Score: 2 If you checked off any problems, how difficult have these problems made it for you to do your work, take care  of things at home, or get along with other people?: Somewhat difficult  Depression Treatment Depression Interventions/Treatment : PHQ2-9 Score <4 Follow-up Not Indicated     Goals Addressed               This Visit's Progress     DIET - INCREASE WATER INTAKE and lose some weight (pt-stated)          Visit info / Clinical Intake: Medicare Wellness Visit Type:: Subsequent Annual Wellness Visit Persons participating in visit:: patient Medicare Wellness Visit Mode:: In-person (required for WTM) Information given by:: patient Interpreter Needed?: No Pre-visit prep was completed: yes AWV questionnaire completed by patient prior to visit?: yes Date:: 07/23/24 Living arrangements:: (!) lives alone Patient's Overall Health Status Rating: (!) fair Typical amount of pain: (!) a lot Does pain affect daily life?: (!) yes Are you currently prescribed opioids?: no  Dietary Habits and Nutritional Risks How many meals a day?: other: (snacks through out the day) Eats fruit and vegetables daily?: (!) no Most meals are obtained by: eating out In the last 2 weeks, have you had any of the following?: none Diabetic:: no  Functional Status Activities of Daily Living (to include ambulation/medication): Independent Ambulation: Independent with device- listed below Home Assistive Devices/Equipment: Shower/tub chair Medication Administration: Independent Home Management: Independent Manage your own finances?: yes Primary transportation is: driving Concerns about vision?: no *vision screening is required for WTM* Concerns about hearing?: no  Fall Screening Falls in the past year?: 0 Number of falls in past year: 0 Was there an injury with Fall?: 0 Fall Risk Category Calculator: 0 Patient Fall Risk Level: Low Fall Risk  Fall Risk Patient at Risk for Falls Due to: No Fall Risks Fall risk Follow up: Falls evaluation completed  Home and Transportation Safety: All rugs have non-skid backing?: (!) no All stairs or steps have railings?: yes Grab bars in the bathtub or shower?: (!) no (shower chair) Have non-skid surface in bathtub or shower?: (!) no Good home lighting?: yes Regular seat belt use?: yes Hospital stays in the last year:: no  Cognitive Assessment Difficulty concentrating,  remembering, or making decisions? : no Will 6CIT or Mini Cog be Completed: yes What year is it?: 0 points What month is it?: 0 points Give patient an address phrase to remember (5 components): [address] [place] About what time is it?: 0 points Count backwards from 20 to 1: 0 points Say the months of the year in reverse: 0 points Repeat the address phrase from earlier: 0 points 6 CIT Score: 0 points  Advance Directives (For Healthcare) Does Patient Have a Medical Advance Directive?: No Would patient like information on creating a medical advance directive?: Yes (MAU/Ambulatory/Procedural Areas - Information given)  Reviewed/Updated  Reviewed/Updated: Reviewed All (Medical, Surgical, Family, Medications, Allergies, Care Teams, Patient Goals)        Objective:    Today's Vitals   07/27/24 0921  BP: 130/82  Weight: 241 lb 12.8 oz (109.7 kg)  Height: 5' 1 (1.549 m)   Body mass index is 45.69 kg/m.  Current Medications (verified) Outpatient Encounter Medications as of 07/27/2024  Medication Sig   albuterol  (VENTOLIN  HFA) 108 (90 Base) MCG/ACT inhaler Inhale 2 puffs into the lungs every 6 (six) hours as needed for wheezing or shortness of breath.   amLODipine  (NORVASC ) 5 MG tablet Take 1 tablet (5 mg total) by mouth daily.   fluticasone (FLONASE) 50 MCG/ACT nasal spray Place 2 sprays into both nostrils daily.  meloxicam  (MOBIC ) 15 MG tablet Take 1 tablet (15 mg total) by mouth daily.   omeprazole  (PRILOSEC) 40 MG capsule Take 1 capsule (40 mg total) by mouth daily.   No facility-administered encounter medications on file as of 07/27/2024.   Hearing/Vision screen Hearing Screening - Comments:: Denies hearing loss  Vision Screening - Comments:: Recommended routine eye exam. List of eye doctors included in AVS Immunizations and Health Maintenance Health Maintenance  Topic Date Due   Pneumococcal Vaccine: 50+ Years (1 of 1 - PCV) Never done   Zoster Vaccines- Shingrix  (1 of 2) Never done   Influenza Vaccine  09/27/2024 (Originally 04/07/2024)   Fecal DNA (Cologuard)  08/15/2024   Mammogram  04/20/2025   Medicare Annual Wellness (AWV)  07/27/2025   DTaP/Tdap/Td (2 - Tdap) 07/30/2031   Hepatitis C Screening  Completed   HIV Screening  Completed   Hepatitis B Vaccines 19-59 Average Risk  Aged Out   HPV VACCINES  Aged Out   Meningococcal B Vaccine  Aged Out   COVID-19 Vaccine  Discontinued        Assessment/Plan:  This is a routine wellness examination for [place].  Patient Care Team: Vicci Duwaine SQUIBB, DO as PCP - General (Family Medicine) Maurie Rayfield BIRCH, RN as Oncology Nurse Navigator Hope Almarie ORN, NP as Nurse Practitioner (Pulmonary Disease) Elby Webb Loges, MD as Referring Physician (Gynecologic Oncology) Hilma Hastings, PA-C as Physician Assistant (Neurosurgery) Darliss Rogue, MD as Consulting Physician (Cardiology)  I have personally reviewed and noted the following in the patient's chart:   Medical and social history Use of alcohol, tobacco or illicit drugs  Current medications and supplements including opioid prescriptions. Functional ability and status Nutritional status Physical activity Advanced directives List of other physicians Hospitalizations, surgeries, and ER visits in previous 12 months Vitals Screenings to include cognitive, depression, and falls Referrals and appointments  Orders Placed This Encounter  Procedures   Cologuard   In addition, I have reviewed and discussed with patient certain preventive protocols, quality metrics, and best practice recommendations. A written personalized care plan for preventive services as well as general preventive health recommendations were provided to patient.   Vina Ned, CMA   07/27/2024   Return in 1 year (on 07/31/2025).  After Visit Summary: (In Person-Printed) AVS printed and given to the patient  Nurse Notes:  Placed order for cologuard (due  ~08/15/24) Needs routine eye exam. Included a list of eye doctors in AVS Declined flu, pneumonia, covid and shingles vaccines

## 2024-08-07 ENCOUNTER — Ambulatory Visit (INDEPENDENT_AMBULATORY_CARE_PROVIDER_SITE_OTHER): Admitting: Family Medicine

## 2024-08-07 ENCOUNTER — Encounter: Payer: Self-pay | Admitting: Family Medicine

## 2024-08-07 VITALS — BP 125/83 | HR 71 | Temp 98.0°F | Ht 61.0 in | Wt 242.0 lb

## 2024-08-07 DIAGNOSIS — M5441 Lumbago with sciatica, right side: Secondary | ICD-10-CM

## 2024-08-07 DIAGNOSIS — I10 Essential (primary) hypertension: Secondary | ICD-10-CM

## 2024-08-07 DIAGNOSIS — R7303 Prediabetes: Secondary | ICD-10-CM | POA: Diagnosis not present

## 2024-08-07 DIAGNOSIS — Z136 Encounter for screening for cardiovascular disorders: Secondary | ICD-10-CM

## 2024-08-07 DIAGNOSIS — K219 Gastro-esophageal reflux disease without esophagitis: Secondary | ICD-10-CM

## 2024-08-07 DIAGNOSIS — G8929 Other chronic pain: Secondary | ICD-10-CM

## 2024-08-07 DIAGNOSIS — M5442 Lumbago with sciatica, left side: Secondary | ICD-10-CM

## 2024-08-07 MED ORDER — OMEPRAZOLE 40 MG PO CPDR: 40.0000 mg | DELAYED_RELEASE_CAPSULE | Freq: Every day | ORAL | 1 refills | Status: AC

## 2024-08-07 MED ORDER — AMLODIPINE BESYLATE 5 MG PO TABS: 5.0000 mg | ORAL_TABLET | Freq: Every day | ORAL | 1 refills | Status: AC

## 2024-08-07 MED ORDER — MELOXICAM 15 MG PO TABS: 15.0000 mg | ORAL_TABLET | Freq: Every day | ORAL | 1 refills | Status: AC

## 2024-08-07 MED ORDER — FLUTICASONE PROPIONATE 50 MCG/ACT NA SUSP: 2.0000 | Freq: Every day | NASAL | 12 refills | Status: AC

## 2024-08-07 NOTE — Assessment & Plan Note (Signed)
 Rechecking labs today. Await results. Treat as needed.

## 2024-08-07 NOTE — Progress Notes (Signed)
 BP 125/83 (BP Location: Left Arm, Cuff Size: Large)   Pulse 71   Temp 98 F (36.7 C) (Oral)   Ht 5' 1 (1.549 m)   Wt 242 lb (109.8 kg)   LMP  (LMP Unknown)   SpO2 97%   BMI 45.73 kg/m    Subjective:    Patient ID: Allison Dixon, female    DOB: [birth year], 63 y.o.   MRN: [id number]  HPI: Allison Dixon is a 63 y.o. female  Chief Complaint  Patient presents with   Hypertension    Bp @ avg 138/98   Back Pain   Gastroesophageal Reflux   HYPERTENSION  Hypertension status: controlled  Satisfied with current treatment? yes Duration of hypertension: chronic BP monitoring frequency:  rarely BP medication side effects:  no Medication compliance: excellent compliance Previous BP meds:amlodipine  Aspirin: no Recurrent headaches: no Visual changes: no Palpitations: no Dyspnea: no Chest pain: no Lower extremity edema: no Dizzy/lightheaded: no  GERD GERD control status: controlled Satisfied with current treatment? yes Heartburn frequency: rarely Medication side effects: no  Medication compliance: excellent Dysphagia: no Odynophagia:  no Hematemesis: no Blood in stool: no EGD: no  BACK PAIN Duration: chronic Mechanism of injury: unknown Location: bilateral and low back Onset: gradual Severity: severe Quality: aching, shooting, numb Frequency: constant Radiation: R leg below the knee and L leg below the knee Aggravating factors: moving Alleviating factors: nothing Status: worse Treatments attempted: rest, ice, heat, APAP, ibuprofen , aleve , physical therapy, and HEP  Relief with NSAIDs?: mild Nighttime pain:  yes Paresthesias / decreased sensation:  yes Bowel / bladder incontinence:  no Fevers:  no Dysuria / urinary frequency:  no  Relevant past medical, surgical, family and social history reviewed and updated as indicated. Interim medical history since our last visit reviewed. Allergies and medications reviewed and updated.  Review of Systems   Constitutional: Negative.   Respiratory: Negative.    Cardiovascular: Negative.   Gastrointestinal: Negative.   Musculoskeletal:  Positive for back pain and myalgias. Negative for arthralgias, gait problem, joint swelling, neck pain and neck stiffness.  Skin: Negative.   Neurological: Negative.   Psychiatric/Behavioral: Negative.      Per HPI unless specifically indicated above     Objective:    BP 125/83 (BP Location: Left Arm, Cuff Size: Large)   Pulse 71   Temp 98 F (36.7 C) (Oral)   Ht 5' 1 (1.549 m)   Wt 242 lb (109.8 kg)   LMP  (LMP Unknown)   SpO2 97%   BMI 45.73 kg/m   Wt Readings from Last 3 Encounters:  08/07/24 242 lb (109.8 kg)  07/27/24 241 lb 12.8 oz (109.7 kg)  06/28/24 238 lb 11.2 oz (108.3 kg)    Physical Exam Vitals and nursing note reviewed.  Constitutional:      General: She is not in acute distress.    Appearance: Normal appearance. She is obese. She is not ill-appearing, toxic-appearing or diaphoretic.  HENT:     Head: Normocephalic and atraumatic.     Right Ear: External ear normal.     Left Ear: External ear normal.     Nose: Nose normal.     Mouth/Throat:     Mouth: Mucous membranes are moist.     Pharynx: Oropharynx is clear.  Eyes:     General: No scleral icterus.       Right eye: No discharge.        Left eye: No discharge.  Extraocular Movements: Extraocular movements intact.     Conjunctiva/sclera: Conjunctivae normal.     Pupils: Pupils are equal, round, and reactive to light.  Cardiovascular:     Rate and Rhythm: Normal rate and regular rhythm.     Pulses: Normal pulses.     Heart sounds: Normal heart sounds. No murmur heard.    No friction rub. No gallop.  Pulmonary:     Effort: Pulmonary effort is normal. No respiratory distress.     Breath sounds: Normal breath sounds. No stridor. No wheezing, rhonchi or rales.  Chest:     Chest wall: No tenderness.  Musculoskeletal:        General: Normal range of motion.      Cervical back: Normal range of motion and neck supple.  Skin:    General: Skin is warm and dry.     Capillary Refill: Capillary refill takes less than 2 seconds.     Coloration: Skin is not jaundiced or pale.     Findings: No bruising, erythema, lesion or rash.  Neurological:     General: No focal deficit present.     Mental Status: She is alert and oriented to person, place, and time. Mental status is at baseline.  Psychiatric:        Mood and Affect: Mood normal.        Behavior: Behavior normal.        Thought Content: Thought content normal.        Judgment: Judgment normal.     Results for orders placed or performed during the hospital encounter of 02/29/24  NM Myocar Multi W/Spect W/Wall Motion / EF   Collection Time: 02/29/24 12:09 PM  Result Value Ref Range   Rest HR 66.0 bpm   Rest BP 129/72 mmHg   Peak HR 83 bpm   Peak BP 140/55 mmHg   MPHR 160 bpm   Percent HR 51.0 %   ST Depression (mm) 0 mm   Rest Nuclear Isotope Dose 10.4 mCi   Stress Nuclear Isotope Dose 32.3 mCi   SSS 3.0    SRS 4.0    SDS 0.0    TID 0.85    Nuc Stress EF 51 %   LV sys vol 69.0 3.8 - 5.2 mL   LV dias vol 140.0 46 - 106 mL      Assessment & Plan:   Problem List Items Addressed This Visit       Cardiovascular and Mediastinum   Primary hypertension - Primary   Under good control on current regimen. Continue current regimen. Continue to monitor. Call with any concerns. Refills given. Labs drawn today.        Relevant Medications   amLODipine  (NORVASC ) 5 MG tablet   Other Relevant Orders   CBC with Differential/Platelet   Comprehensive metabolic panel with GFR   TSH   Urine Microalbumin w/creat. ratio     Digestive   Gastroesophageal reflux disease without esophagitis   Under good control on current regimen. Continue current regimen. Continue to monitor. Call with any concerns. Refills given. Labs drawn today.       Relevant Medications   omeprazole  (PRILOSEC) 40 MG capsule    Other Relevant Orders   CBC with Differential/Platelet   Comprehensive metabolic panel with GFR     Nervous and Auditory   Chronic bilateral low back pain with bilateral sciatica   Neurosurgery has referred her to [facility]- has not seen them yet. Their contact information given to patient  today. Call with any concerns.       Relevant Medications   meloxicam  (MOBIC ) 15 MG tablet     Other   Prediabetes   Rechecking labs today. Await results. Treat as needed.       Relevant Orders   CBC with Differential/Platelet   Comprehensive metabolic panel with GFR   Hgb J8r w/o eAG   Morbid obesity (HCC)   Encouraged diet and exercise with goal of losing 1-2lbs per week. Call with any concerns.       Relevant Orders   CBC with Differential/Platelet   Comprehensive metabolic panel with GFR   Other Visit Diagnoses       Screening for cardiovascular condition       Labs drawn today. Await results. Treat as needed.   Relevant Orders   Lipid Panel w/o Chol/HDL Ratio        Follow up plan: Return in about 6 months (around 02/05/2025).

## 2024-08-07 NOTE — Assessment & Plan Note (Signed)
 Under good control on current regimen. Continue current regimen. Continue to monitor. Call with any concerns. Refills given. Labs drawn today.

## 2024-08-07 NOTE — Patient Instructions (Signed)
 Physiatry Department [address] [place], [place]  [address] [phone]

## 2024-08-07 NOTE — Assessment & Plan Note (Signed)
 Encouraged diet and exercise with goal of losing 1-2lbs per week. Call with any concerns.

## 2024-08-07 NOTE — Assessment & Plan Note (Signed)
 Neurosurgery has referred her to [facility]- has not seen them yet. Their contact information given to patient today. Call with any concerns.

## 2024-08-08 ENCOUNTER — Ambulatory Visit: Payer: Self-pay | Admitting: Family Medicine

## 2024-08-08 LAB — CBC WITH DIFFERENTIAL/PLATELET
Basophils Absolute: 0 x10E3/uL (ref 0.0–0.2)
Basos: 1 %
EOS (ABSOLUTE): 0.1 x10E3/uL (ref 0.0–0.4)
Eos: 2 %
Hematocrit: 40.4 % (ref 34.0–46.6)
Hemoglobin: 13.2 g/dL (ref 11.1–15.9)
Immature Grans (Abs): 0 x10E3/uL (ref 0.0–0.1)
Immature Granulocytes: 0 %
Lymphocytes Absolute: 2.1 x10E3/uL (ref 0.7–3.1)
Lymphs: 36 %
MCH: 29 pg (ref 26.6–33.0)
MCHC: 32.7 g/dL (ref 31.5–35.7)
MCV: 89 fL (ref 79–97)
Monocytes Absolute: 0.5 x10E3/uL (ref 0.1–0.9)
Monocytes: 8 %
Neutrophils Absolute: 3 x10E3/uL (ref 1.4–7.0)
Neutrophils: 53 %
Platelets: 247 x10E3/uL (ref 150–450)
RBC: 4.55 x10E6/uL (ref 3.77–5.28)
RDW: 12.8 % (ref 11.7–15.4)
WBC: 5.7 x10E3/uL (ref 3.4–10.8)

## 2024-08-08 LAB — COMPREHENSIVE METABOLIC PANEL WITH GFR
ALT: 8 IU/L (ref 0–32)
AST: 14 IU/L (ref 0–40)
Albumin: 3.8 g/dL — ABNORMAL LOW (ref 3.9–4.9)
Alkaline Phosphatase: 83 IU/L (ref 49–135)
BUN/Creatinine Ratio: 19 (ref 12–28)
BUN: 15 mg/dL (ref 8–27)
Bilirubin Total: 0.6 mg/dL (ref 0.0–1.2)
CO2: 24 mmol/L (ref 20–29)
Calcium: 9.1 mg/dL (ref 8.7–10.3)
Chloride: 108 mmol/L — ABNORMAL HIGH (ref 96–106)
Creatinine, Ser: 0.81 mg/dL (ref 0.57–1.00)
Globulin, Total: 3.4 g/dL (ref 1.5–4.5)
Glucose: 90 mg/dL (ref 70–99)
Potassium: 4 mmol/L (ref 3.5–5.2)
Sodium: 144 mmol/L (ref 134–144)
Total Protein: 7.2 g/dL (ref 6.0–8.5)
eGFR: 83 mL/min/1.73 (ref >59)

## 2024-08-08 LAB — MICROALBUMIN / CREATININE URINE RATIO
Creatinine, Urine: 83.7 mg/dL
Microalb/Creat Ratio: <4 mg/g{creat} (ref 0–29)
Microalbumin, Urine: <3 ug/mL

## 2024-08-08 LAB — LIPID PANEL W/O CHOL/HDL RATIO
Cholesterol, Total: 179 mg/dL (ref 100–199)
HDL: 72 mg/dL (ref >39)
LDL Chol Calc (NIH): 94 mg/dL (ref 0–99)
Triglycerides: 66 mg/dL (ref 0–149)
VLDL Cholesterol Cal: 13 mg/dL (ref 5–40)

## 2024-08-08 LAB — HGB A1C W/O EAG: Hgb A1c MFr Bld: 5.5 % (ref 4.8–5.6)

## 2024-08-08 LAB — TSH: TSH: 1.35 u[IU]/mL (ref 0.450–4.500)

## 2024-08-30 LAB — COLOGUARD: COLOGUARD: NEGATIVE

## 2024-09-01 ENCOUNTER — Ambulatory Visit: Payer: Self-pay | Admitting: Family Medicine

## 2024-12-27 ENCOUNTER — Inpatient Hospital Stay

## 2025-02-05 ENCOUNTER — Ambulatory Visit: Admitting: Family Medicine

## 2025-07-31 ENCOUNTER — Ambulatory Visit
